# Patient Record
Sex: Female | Born: 1965 | Race: Black or African American | Hispanic: No | Marital: Married | Smoking: Current every day smoker
Health system: Southern US, Community
[De-identification: ages and names within clinical notes are randomized; demographics above are authoritative.]

## PROBLEM LIST (undated history)

## (undated) DIAGNOSIS — C539 Malignant neoplasm of cervix uteri, unspecified: Secondary | ICD-10-CM

## (undated) DIAGNOSIS — K219 Gastro-esophageal reflux disease without esophagitis: Secondary | ICD-10-CM

## (undated) DIAGNOSIS — E785 Hyperlipidemia, unspecified: Secondary | ICD-10-CM

## (undated) DIAGNOSIS — K7460 Unspecified cirrhosis of liver: Secondary | ICD-10-CM

## (undated) DIAGNOSIS — R51 Headache: Secondary | ICD-10-CM

## (undated) DIAGNOSIS — G35 Multiple sclerosis: Secondary | ICD-10-CM

## (undated) DIAGNOSIS — F419 Anxiety disorder, unspecified: Secondary | ICD-10-CM

## (undated) DIAGNOSIS — K769 Liver disease, unspecified: Secondary | ICD-10-CM

## (undated) DIAGNOSIS — Z87442 Personal history of urinary calculi: Secondary | ICD-10-CM

## (undated) DIAGNOSIS — F1011 Alcohol abuse, in remission: Secondary | ICD-10-CM

## (undated) DIAGNOSIS — G43909 Migraine, unspecified, not intractable, without status migrainosus: Secondary | ICD-10-CM

## (undated) DIAGNOSIS — Z8673 Personal history of transient ischemic attack (TIA), and cerebral infarction without residual deficits: Secondary | ICD-10-CM

## (undated) DIAGNOSIS — M359 Systemic involvement of connective tissue, unspecified: Secondary | ICD-10-CM

## (undated) DIAGNOSIS — G4700 Insomnia, unspecified: Secondary | ICD-10-CM

## (undated) DIAGNOSIS — J449 Chronic obstructive pulmonary disease, unspecified: Secondary | ICD-10-CM

## (undated) DIAGNOSIS — Z9289 Personal history of other medical treatment: Secondary | ICD-10-CM

## (undated) DIAGNOSIS — F32A Depression, unspecified: Secondary | ICD-10-CM

## (undated) DIAGNOSIS — F329 Major depressive disorder, single episode, unspecified: Secondary | ICD-10-CM

## (undated) DIAGNOSIS — Z9889 Other specified postprocedural states: Secondary | ICD-10-CM

## (undated) DIAGNOSIS — I10 Essential (primary) hypertension: Secondary | ICD-10-CM

## (undated) HISTORY — PX: CERVICAL CONE BIOPSY: SUR198

## (undated) HISTORY — PX: ECTOPIC PREGNANCY SURGERY: SHX613

## (undated) HISTORY — DX: Personal history of other medical treatment: Z92.89

## (undated) HISTORY — DX: Malignant neoplasm of cervix uteri, unspecified: C53.9

## (undated) HISTORY — DX: Depression, unspecified: F32.A

## (undated) HISTORY — DX: Personal history of transient ischemic attack (TIA), and cerebral infarction without residual deficits: Z86.73

## (undated) HISTORY — DX: Unspecified cirrhosis of liver: K74.60

## (undated) HISTORY — PX: HAND SURGERY: SHX662

## (undated) HISTORY — PX: TUBAL LIGATION: SHX77

## (undated) HISTORY — DX: Major depressive disorder, single episode, unspecified: F32.9

## (undated) HISTORY — DX: Chronic obstructive pulmonary disease, unspecified: J44.9

## (undated) HISTORY — DX: Liver disease, unspecified: K76.9

## (undated) HISTORY — DX: Alcohol abuse, in remission: F10.11

## (undated) HISTORY — DX: Multiple sclerosis: G35

## (undated) HISTORY — DX: Anxiety disorder, unspecified: F41.9

## (undated) HISTORY — DX: Other specified postprocedural states: Z98.890

## (undated) HISTORY — DX: Essential (primary) hypertension: I10

---

## 1998-09-13 DIAGNOSIS — Z87442 Personal history of urinary calculi: Secondary | ICD-10-CM

## 1998-09-13 HISTORY — DX: Personal history of urinary calculi: Z87.442

## 2000-09-13 HISTORY — PX: BREAST SURGERY: SHX581

## 2001-01-18 ENCOUNTER — Encounter (HOSPITAL_COMMUNITY): Admission: RE | Admit: 2001-01-18 | Discharge: 2001-02-17 | Payer: Self-pay | Admitting: Oncology

## 2001-01-18 ENCOUNTER — Encounter: Admission: RE | Admit: 2001-01-18 | Discharge: 2001-01-18 | Payer: Self-pay | Admitting: Oncology

## 2001-06-05 ENCOUNTER — Ambulatory Visit (HOSPITAL_BASED_OUTPATIENT_CLINIC_OR_DEPARTMENT_OTHER): Admission: RE | Admit: 2001-06-05 | Discharge: 2001-06-06 | Payer: Self-pay | Admitting: Specialist

## 2001-06-05 ENCOUNTER — Encounter (INDEPENDENT_AMBULATORY_CARE_PROVIDER_SITE_OTHER): Payer: Self-pay | Admitting: *Deleted

## 2001-07-19 ENCOUNTER — Emergency Department (HOSPITAL_COMMUNITY): Admission: EM | Admit: 2001-07-19 | Discharge: 2001-07-19 | Payer: Self-pay | Admitting: *Deleted

## 2002-09-13 HISTORY — PX: ABDOMINAL HYSTERECTOMY: SHX81

## 2003-11-14 ENCOUNTER — Emergency Department (HOSPITAL_COMMUNITY): Admission: EM | Admit: 2003-11-14 | Discharge: 2003-11-14 | Payer: Self-pay | Admitting: Emergency Medicine

## 2003-12-17 ENCOUNTER — Inpatient Hospital Stay (HOSPITAL_COMMUNITY): Admission: RE | Admit: 2003-12-17 | Discharge: 2003-12-20 | Payer: Self-pay | Admitting: Obstetrics & Gynecology

## 2004-04-03 ENCOUNTER — Ambulatory Visit (HOSPITAL_COMMUNITY): Admission: RE | Admit: 2004-04-03 | Discharge: 2004-04-03 | Payer: Self-pay | Admitting: Family Medicine

## 2004-12-31 ENCOUNTER — Emergency Department: Payer: Self-pay | Admitting: Internal Medicine

## 2006-09-21 ENCOUNTER — Ambulatory Visit (HOSPITAL_COMMUNITY): Admission: RE | Admit: 2006-09-21 | Discharge: 2006-09-21 | Payer: Self-pay | Admitting: Family Medicine

## 2007-01-01 ENCOUNTER — Emergency Department (HOSPITAL_COMMUNITY): Admission: EM | Admit: 2007-01-01 | Discharge: 2007-01-01 | Payer: Self-pay | Admitting: Emergency Medicine

## 2007-11-08 ENCOUNTER — Emergency Department (HOSPITAL_COMMUNITY): Admission: EM | Admit: 2007-11-08 | Discharge: 2007-11-08 | Payer: Self-pay | Admitting: Emergency Medicine

## 2007-11-17 ENCOUNTER — Ambulatory Visit (HOSPITAL_COMMUNITY): Admission: RE | Admit: 2007-11-17 | Discharge: 2007-11-17 | Payer: Self-pay | Admitting: Orthopaedic Surgery

## 2008-01-16 ENCOUNTER — Emergency Department (HOSPITAL_COMMUNITY): Admission: EM | Admit: 2008-01-16 | Discharge: 2008-01-16 | Payer: Self-pay | Admitting: Emergency Medicine

## 2008-03-13 ENCOUNTER — Ambulatory Visit (HOSPITAL_COMMUNITY): Admission: RE | Admit: 2008-03-13 | Discharge: 2008-03-13 | Payer: Self-pay | Admitting: Family Medicine

## 2008-09-13 ENCOUNTER — Emergency Department (HOSPITAL_COMMUNITY): Admission: EM | Admit: 2008-09-13 | Discharge: 2008-09-13 | Payer: Self-pay | Admitting: Emergency Medicine

## 2009-06-19 ENCOUNTER — Ambulatory Visit (HOSPITAL_COMMUNITY): Admission: RE | Admit: 2009-06-19 | Discharge: 2009-06-19 | Payer: Self-pay | Admitting: Family Medicine

## 2009-12-23 ENCOUNTER — Observation Stay (HOSPITAL_COMMUNITY): Admission: EM | Admit: 2009-12-23 | Discharge: 2009-12-26 | Payer: Self-pay | Admitting: Emergency Medicine

## 2009-12-25 ENCOUNTER — Ambulatory Visit: Payer: Self-pay | Admitting: Cardiology

## 2009-12-31 ENCOUNTER — Ambulatory Visit (HOSPITAL_COMMUNITY): Admission: RE | Admit: 2009-12-31 | Discharge: 2009-12-31 | Payer: Self-pay | Admitting: Cardiology

## 2009-12-31 ENCOUNTER — Ambulatory Visit: Payer: Self-pay | Admitting: Cardiology

## 2009-12-31 ENCOUNTER — Encounter: Payer: Self-pay | Admitting: Cardiology

## 2010-04-21 ENCOUNTER — Observation Stay (HOSPITAL_COMMUNITY): Admission: RE | Admit: 2010-04-21 | Discharge: 2010-04-22 | Payer: Self-pay | Admitting: Cardiology

## 2010-11-27 LAB — CBC
HCT: 36.8 % (ref 36.0–46.0)
MCH: 30.1 pg (ref 26.0–34.0)
MCHC: 32.3 g/dL (ref 30.0–36.0)
MCV: 93.2 fL (ref 78.0–100.0)
Platelets: 368 10*3/uL (ref 150–400)
RDW: 13.5 % (ref 11.5–15.5)

## 2010-11-27 LAB — BASIC METABOLIC PANEL
BUN: 10 mg/dL (ref 6–23)
Creatinine, Ser: 0.69 mg/dL (ref 0.4–1.2)
GFR calc Af Amer: >60 mL/min (ref >60)

## 2010-12-02 LAB — CBC
HCT: 38 % (ref 36.0–46.0)
Hemoglobin: 13.2 g/dL (ref 12.0–15.0)
MCHC: 34.8 g/dL (ref 30.0–36.0)
MCV: 92.1 fL (ref 78.0–100.0)
RDW: 13.3 % (ref 11.5–15.5)

## 2010-12-02 LAB — LIPID PANEL
Cholesterol: 175 mg/dL (ref 0–200)
HDL: 64 mg/dL (ref >39)
LDL Cholesterol: 76 mg/dL (ref 0–99)
Total CHOL/HDL Ratio: 2.7 RATIO
Triglycerides: 177 mg/dL — ABNORMAL HIGH (ref <150)

## 2010-12-02 LAB — APTT: aPTT: 26 seconds (ref 24–37)

## 2010-12-02 LAB — BASIC METABOLIC PANEL
BUN: 7 mg/dL (ref 6–23)
BUN: 7 mg/dL (ref 6–23)
CO2: 24 mEq/L (ref 19–32)
Chloride: 103 mEq/L (ref 96–112)
Chloride: 110 mEq/L (ref 96–112)
GFR calc Af Amer: >60 mL/min (ref >60)
GFR calc non Af Amer: >60 mL/min (ref >60)
Potassium: 4.1 mEq/L (ref 3.5–5.1)
Sodium: 137 mEq/L (ref 135–145)

## 2010-12-02 LAB — DIFFERENTIAL
Basophils Absolute: 0.1 10*3/uL (ref 0.0–0.1)
Basophils Relative: 1 % (ref 0–1)
Eosinophils Absolute: 0.1 10*3/uL (ref 0.0–0.7)
Eosinophils Relative: 1 % (ref 0–5)
Lymphocytes Relative: 33 % (ref 12–46)
Monocytes Relative: 5 % (ref 3–12)

## 2010-12-02 LAB — CK TOTAL AND CKMB (NOT AT ARMC)
Relative Index: 1.6 (ref 0.0–2.5)
Total CK: 119 U/L (ref 7–177)

## 2010-12-02 LAB — MAGNESIUM: Magnesium: 2 mg/dL (ref 1.5–2.5)

## 2010-12-02 LAB — POCT CARDIAC MARKERS: Troponin i, poc: <0.05 ng/mL (ref 0.00–0.09)

## 2010-12-02 LAB — CARDIAC PANEL(CRET KIN+CKTOT+MB+TROPI)
CK, MB: 1.7 ng/mL (ref 0.3–4.0)
Total CK: 102 U/L (ref 7–177)
Total CK: 82 U/L (ref 7–177)
Troponin I: 0.01 ng/mL (ref 0.00–0.06)

## 2010-12-02 LAB — PROTIME-INR
INR: 0.96 (ref 0.00–1.49)
Prothrombin Time: 12.7 seconds (ref 11.6–15.2)

## 2010-12-28 LAB — GC/CHLAMYDIA PROBE AMP, GENITAL
Chlamydia, DNA Probe: NEGATIVE
GC Probe Amp, Genital: NEGATIVE

## 2010-12-28 LAB — COMPREHENSIVE METABOLIC PANEL
ALT: 36 U/L — ABNORMAL HIGH (ref 0–35)
Albumin: 4.3 g/dL (ref 3.5–5.2)
Alkaline Phosphatase: 52 U/L (ref 39–117)
Chloride: 100 mEq/L (ref 96–112)
Glucose, Bld: 114 mg/dL — ABNORMAL HIGH (ref 70–99)
Potassium: 3.6 mEq/L (ref 3.5–5.1)
Sodium: 136 mEq/L (ref 135–145)
Total Bilirubin: 0.4 mg/dL (ref 0.3–1.2)
Total Protein: 7.8 g/dL (ref 6.0–8.3)

## 2010-12-28 LAB — DIFFERENTIAL
Basophils Absolute: 0.1 10*3/uL (ref 0.0–0.1)
Basophils Relative: 0 % (ref 0–1)
Eosinophils Absolute: 0.1 10*3/uL (ref 0.0–0.7)
Monocytes Absolute: 0.7 10*3/uL (ref 0.1–1.0)
Monocytes Relative: 5 % (ref 3–12)

## 2010-12-28 LAB — URINALYSIS, ROUTINE W REFLEX MICROSCOPIC
Glucose, UA: NEGATIVE mg/dL
Ketones, ur: NEGATIVE mg/dL
Leukocytes, UA: NEGATIVE
Protein, ur: NEGATIVE mg/dL
pH: 5.5 (ref 5.0–8.0)

## 2010-12-28 LAB — CBC
Hemoglobin: 14.7 g/dL (ref 12.0–15.0)
Platelets: 457 10*3/uL — ABNORMAL HIGH (ref 150–400)
RDW: 14.4 % (ref 11.5–15.5)
WBC: 13.6 10*3/uL — ABNORMAL HIGH (ref 4.0–10.5)

## 2011-01-29 NOTE — Discharge Summary (Signed)
Courtney Thornton, Courtney Thornton                     ACCOUNT NO.:  [id number]   MEDICAL RECORD NO.:  [id number]                   PATIENT TYPE:  INP   LOCATION:  A402                                 FACILITY:  [facility]   PHYSICIAN:  Lazaro Arms, M.D.                DATE OF BIRTH:  [birth year]   DATE OF ADMISSION:  12/17/2003  DATE OF DISCHARGE:  12/20/2003                                 DISCHARGE SUMMARY   DISCHARGE DIAGNOSES:  1. Status post abdominal hysterectomy.  2. Unremarkable postoperative course.   PROCEDURE:  Abdominal hysterectomy.   HISTORY OF PRESENT ILLNESS:  Please refer to the transcribed history and  physical and the operative note for details of admission to the hospital.   HOSPITAL COURSE:  The patient was admitted after abdominal hysterectomy.  Intraoperative course was unremarkable.  She tolerated clear liquids and a  regular diet.  She voided without symptoms.  She was ambulatory and had  return of normal bowel function.  Her postoperative day #1 hemoglobin was  11, hematocrit 32.4, white count 13.7.  She remained afebrile.  Her incision  was clean, dry, and intact.  She tolerated progression from IV medicine to  oral pain medicine.  She was discharged to home on the morning of  postoperative day #3 in good and stable condition.   FOLLOW UP:  She is to follow up in the office next week to have her staples  removed and Steri-Strips placed.   DISCHARGE MEDICATIONS:  She was given Tylox and Motrin for pain, Phenergan  for nausea, and Levaquin as an antibiotic.   DISCHARGE INSTRUCTIONS:  If she has any problems in between now and her  scheduled appointment, she will give the office a call.     ___________________________________________                                         Lazaro Arms, M.D.   LHE/MEDQ  D:  01/21/2004  T:  01/21/2004  Job:  [id number]

## 2011-01-29 NOTE — Op Note (Signed)
[name], Courtney Thornton                     ACCOUNT NO.:  [id number]   MEDICAL RECORD NO.:  [id number]                   PATIENT TYPE:  AMB   LOCATION:  DAY                                  FACILITY:  [facility]   PHYSICIAN:  Lazaro Arms, M.D.                DATE OF BIRTH:  September 27, 1965   DATE OF PROCEDURE:  12/17/2003  DATE OF DISCHARGE:                                 OPERATIVE REPORT   PREOPERATIVE DIAGNOSES:  1. Large fibroid uterus.  2. Menometrorrhagia.  3. Dysmenorrhea.  4. Dyspareunia.   POSTOPERATIVE DIAGNOSES:  1. Large fibroid uterus.  2. Menometrorrhagia.  3. Dysmenorrhea.  4. Dyspareunia.   PROCEDURE:  Abdominal hysterectomy.   SURGEON:  Lazaro Arms, M.D.   ANESTHESIA:  General endotracheal.   BLOOD LOSS:  100 cc.   SPECIMENS:  All went to the lab.   FINDINGS:  The patient had an irregularly shaped fibroid uterus. It was  enlarged.  The ovaries were normal.  There were no intraperitoneal  abnormalities.   DESCRIPTION OF OPERATION:  The patient was taken to the operating room and  placed in the supine position where she underwent general endotracheal  anesthesia.  The vagina was prepped; Foley catheter was placed.  The abdomen  was prepped and draped in the usual sterile fashion.  A Pfannenstiel skin  incision was made and carried down sharply through the rectus fascia which  was scored in the midline and extended laterally.  The fascia was taken off  the muscles superiorly and inferiorly.  The peritoneal cavity was entered in  the usual fashion.  A small protractor was placed as a self-retaining  protractor.  The uterus was grasped, delivered from the pelvis, the upper  abdomen was packed away.  The right round ligament was suture ligated and  cut.  The utero-ovarian ligaments were clamped, cut and suture ligated  doubly.  This was done on the right as well.  The uterine vessels were  skeletonized.   The vesicouterine serosal flap was created and the  bladder was pushed off  the lower uterine segment.  The uterine vessels were clamped, cut, and  suture ligated.  Serial pedicles were taken down the cervix.  Each pedicle  was clamped, cut, and sutured ligated.  The vaginal angles were clamped, cut  and transfixion suture ligated.  The vagina was closed with interrupted  figure-of-eight sutures in the usual fashion. The specimen was removed and  sent to the pathology lab.   The pelvis was hemostatic.  It was irrigated vigorously.  INTERCEED was  placed on the vaginal cuff to prevent postoperative adhesion formation.  The  packs were removed.  The protractor was removed.  The muscles were  reapproximated loosely.  The fascia was closed using #0 Vicryl running.  The  subcutaneous tissue was made hemostatic and irrigated.  A subcu pain pump  was placed as medically indicated for postoperative pain management.  The  skin was closed using skin staples.  The patient tolerated the procedure  well.  She experienced 100 cc of blood loss.  She was taken to the recovery  room in good stable condition.  All counts were correct.  She received Ancef  prophylactically.      ___________________________________________                                            Lazaro Arms, M.D.   Loraine Maple  D:  12/17/2003  T:  12/17/2003  Job:  [id number]

## 2011-01-29 NOTE — Op Note (Signed)
[place]  Patient:    Courtney Thornton, Courtney Thornton Visit Number: [id number] MRN: [id number]          Service Type: Attending:  Yaakov Guthrie. Shon Hough, M.D. Dictated by:   Yaakov Guthrie. Shon Hough, M.D. Proc. Date: 06/05/01 Adm. Date:  06/05/01   CC:         Earvin Hansen L. Shon Hough, M.D.   Operative Report  HISTORY:  This is a 45 year old lady with severe macromastia, back and shoulder pain secondary to large pendulous breasts with increased accessory breast tissue.  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  PROCEDURES PLANNED:  Bilateral breast reductions using the inferior pedicle technique, reduction of accessory breast tissue.  SURGEON:  Yaakov Guthrie. Shon Hough, M.D.  ASSISTANT:  Margaretha Sheffield.  FIRST ASSISTANT:  RN.  ANESTHESIA:  General.  PREOPERATIVELY:  The patient was set up for the inferior pedicle reduction and mammoplasty remarking.  The nipple areolar complexes to 20 cm from the suprasternal notch from over 34.  She then underwent general anesthesia and was intubated orally.  Prep was done to the chest and breast areas in the routine fashion using Betadine soap and solution and walled off with sterile towels and drapes so as to make a sterile field.  Locally, 1/4% Xylocaine was injected, 100 cc per side for vasoconstriction.  The wounds were scored with #15 blade and then the skin over the inferior pedicle was epithelialized with a #20 blade.  The medial and lateral fatty, dermal pedicles were sliced down to the underlying fascia.  After proper hemostasis the new key-hole area was also debulked out laterally and more tissue was taken.  Accessory breast tissue using Bovie unit coagulation.  After hemostasis, the flaps were transposed and stayed with 3-0 Prolene.  Subcutaneous closures were done with 3-0 Monocryl x 2 layers and then a running subcuticular stitch of 3-0 Monocryl and 5-0 Monocryl throughout inverted T.  The wounds were  drained with #10 Blake drains which were placed in the depths of the wound and brought out through the lateral most portion of the incision and secured with 5-0 Prolene.  The wounds were cleansed.  Steri-Strips and soft dressing were applied to all the areas.  She withstood the procedures very well and was taken to the recovery room in excellent condition. Dictated by:   Yaakov Guthrie. Shon Hough, M.D. Attending:  Yaakov Guthrie. Shon Hough, M.D. DD:  06/05/01 TD:  06/05/01 Job: [id number]

## 2011-08-04 ENCOUNTER — Other Ambulatory Visit: Payer: Self-pay | Admitting: Medical

## 2011-10-11 ENCOUNTER — Other Ambulatory Visit: Payer: Self-pay | Admitting: Medical

## 2011-10-20 ENCOUNTER — Other Ambulatory Visit (HOSPITAL_COMMUNITY): Payer: Self-pay | Admitting: Neurosurgery

## 2011-10-20 DIAGNOSIS — M5412 Radiculopathy, cervical region: Secondary | ICD-10-CM

## 2011-10-20 DIAGNOSIS — R292 Abnormal reflex: Secondary | ICD-10-CM

## 2011-10-20 DIAGNOSIS — M545 Low back pain: Secondary | ICD-10-CM

## 2011-10-20 DIAGNOSIS — M542 Cervicalgia: Secondary | ICD-10-CM

## 2011-10-28 ENCOUNTER — Ambulatory Visit (HOSPITAL_COMMUNITY)
Admission: RE | Admit: 2011-10-28 | Discharge: 2011-10-28 | Disposition: A | Discharge status: Home / Self Care | Payer: BC Managed Care – PPO | Source: Ambulatory Visit | Attending: Neurosurgery | Admitting: Neurosurgery

## 2011-10-28 ENCOUNTER — Ambulatory Visit (HOSPITAL_COMMUNITY): Payer: Self-pay

## 2011-10-28 DIAGNOSIS — M5126 Other intervertebral disc displacement, lumbar region: Secondary | ICD-10-CM | POA: Insufficient documentation

## 2011-10-28 DIAGNOSIS — M5412 Radiculopathy, cervical region: Secondary | ICD-10-CM

## 2011-10-28 DIAGNOSIS — G9589 Other specified diseases of spinal cord: Secondary | ICD-10-CM | POA: Insufficient documentation

## 2011-10-28 DIAGNOSIS — M47812 Spondylosis without myelopathy or radiculopathy, cervical region: Secondary | ICD-10-CM | POA: Insufficient documentation

## 2011-10-28 DIAGNOSIS — M5137 Other intervertebral disc degeneration, lumbosacral region: Secondary | ICD-10-CM | POA: Insufficient documentation

## 2011-10-28 DIAGNOSIS — M545 Low back pain: Secondary | ICD-10-CM

## 2011-10-28 DIAGNOSIS — M51379 Other intervertebral disc degeneration, lumbosacral region without mention of lumbar back pain or lower extremity pain: Secondary | ICD-10-CM | POA: Insufficient documentation

## 2011-10-28 DIAGNOSIS — R292 Abnormal reflex: Secondary | ICD-10-CM

## 2011-10-28 DIAGNOSIS — M542 Cervicalgia: Secondary | ICD-10-CM

## 2011-11-17 ENCOUNTER — Other Ambulatory Visit: Payer: Self-pay | Admitting: Neurosurgery

## 2011-11-19 ENCOUNTER — Encounter (HOSPITAL_COMMUNITY): Payer: Self-pay | Admitting: Pharmacy Technician

## 2011-11-22 ENCOUNTER — Other Ambulatory Visit: Payer: Self-pay

## 2011-11-22 ENCOUNTER — Encounter (HOSPITAL_COMMUNITY)
Admission: RE | Admit: 2011-11-22 | Discharge: 2011-11-22 | Disposition: A | Discharge status: Home / Self Care | Payer: BC Managed Care – PPO | Source: Ambulatory Visit | Attending: Anesthesiology | Admitting: Anesthesiology

## 2011-11-22 ENCOUNTER — Encounter (HOSPITAL_COMMUNITY)
Admission: RE | Admit: 2011-11-22 | Discharge: 2011-11-22 | Disposition: A | Discharge status: Home / Self Care | Payer: BC Managed Care – PPO | Source: Ambulatory Visit | Attending: Neurosurgery | Admitting: Neurosurgery

## 2011-11-22 ENCOUNTER — Encounter (HOSPITAL_COMMUNITY): Payer: Self-pay

## 2011-11-22 HISTORY — DX: Hyperlipidemia, unspecified: E78.5

## 2011-11-22 HISTORY — DX: Insomnia, unspecified: G47.00

## 2011-11-22 HISTORY — DX: Personal history of urinary calculi: Z87.442

## 2011-11-22 HISTORY — DX: Migraine, unspecified, not intractable, without status migrainosus: G43.909

## 2011-11-22 HISTORY — DX: Gastro-esophageal reflux disease without esophagitis: K21.9

## 2011-11-22 HISTORY — DX: Headache: R51

## 2011-11-22 LAB — SURGICAL PCR SCREEN
MRSA, PCR: NEGATIVE
Staphylococcus aureus: NEGATIVE

## 2011-11-22 LAB — CBC
Hemoglobin: 13.8 g/dL (ref 12.0–15.0)
MCHC: 34.3 g/dL (ref 30.0–36.0)
Platelets: 279 10*3/uL (ref 150–400)

## 2011-11-22 LAB — BASIC METABOLIC PANEL
BUN: 7 mg/dL (ref 6–23)
GFR calc Af Amer: >90 mL/min (ref >90)
GFR calc non Af Amer: >90 mL/min (ref >90)
Potassium: 3.3 mEq/L — ABNORMAL LOW (ref 3.5–5.1)
Sodium: 138 mEq/L (ref 135–145)

## 2011-11-22 NOTE — Pre-Procedure Instructions (Signed)
20 Courtney Thornton  11/22/2011   Your procedure is scheduled on:  Wed, Mar 13 @ 1125 am  Report to [facility] at 0830 AM.  Call this number if you have problems the morning of surgery: [phone]   Remember:   Do not eat food:After Midnight.  May have clear liquids: up to 4 Hours before arrival.(until 4:30 am)  Clear liquids include soda, tea, black coffee, apple or grape juice, broth.  Take these medicines the morning of surgery with A SIP OF WATER: Zoloft,Zantac,Omeprazole,Metoprolol,Xanax,and Albuterol(if needed and Bring Your Inhaler With You)   Do not wear jewelry, make-up or nail polish.  Do not wear lotions, powders, or perfumes. You may wear deodorant.  Do not shave 48 hours prior to surgery.  Do not bring valuables to the hospital.  Contacts, dentures or bridgework may not be worn into surgery.  Leave suitcase in the car. After surgery it may be brought to your room.  For patients admitted to the hospital, checkout time is 11:00 AM the day of discharge.   Patients discharged the day of surgery will not be allowed to drive home.    Special Instructions: CHG Shower Use Special Wash: 1/2 bottle night before surgery and 1/2 bottle morning of surgery.   Please read over the following fact sheets that you were given: Pain Booklet, Coughing and Deep Breathing, MRSA Information and Surgical Site Infection Prevention

## 2011-11-22 NOTE — Progress Notes (Signed)
D/t pt having hx of high platelets and having to receive blood transfusions in the past-called Dr.Massagee to see if he would like a type and screen done,Not at present and if labs come back then we can do on DOS

## 2011-11-22 NOTE — Progress Notes (Signed)
Hasn't had B/P meds since Fri d/t too expensive but does have some at home and will start back taking today

## 2011-11-22 NOTE — Progress Notes (Signed)
Heart cath in 2011-report in epic  Doesn't see a cardiologist  Echo and Stress test in epic from 12/31/09  Medical MD manages HTN-[facility] to [facility] in [place]

## 2011-11-23 MED ORDER — CEFAZOLIN SODIUM 1-5 GM-% IV SOLN
1.0000 g | INTRAVENOUS | Status: AC
  Administered 2011-11-24: 1 g via INTRAVENOUS
  Filled 2011-11-23: qty 50

## 2011-11-24 ENCOUNTER — Ambulatory Visit (HOSPITAL_COMMUNITY): Payer: BC Managed Care – PPO | Admitting: Anesthesiology

## 2011-11-24 ENCOUNTER — Encounter (HOSPITAL_COMMUNITY): Payer: Self-pay | Admitting: Anesthesiology

## 2011-11-24 ENCOUNTER — Encounter (HOSPITAL_COMMUNITY): Payer: Self-pay | Admitting: *Deleted

## 2011-11-24 ENCOUNTER — Ambulatory Visit (HOSPITAL_COMMUNITY)
Admission: RE | Admit: 2011-11-24 | Discharge: 2011-11-26 | Disposition: A | Discharge status: Home / Self Care | Payer: BC Managed Care – PPO | Source: Ambulatory Visit | Attending: Neurosurgery | Admitting: Neurosurgery

## 2011-11-24 ENCOUNTER — Ambulatory Visit (HOSPITAL_COMMUNITY): Payer: BC Managed Care – PPO

## 2011-11-24 ENCOUNTER — Encounter (HOSPITAL_COMMUNITY)
Admission: RE | Disposition: A | Discharge status: Home / Self Care | Payer: Self-pay | Source: Ambulatory Visit | Attending: Neurosurgery

## 2011-11-24 DIAGNOSIS — E785 Hyperlipidemia, unspecified: Secondary | ICD-10-CM | POA: Insufficient documentation

## 2011-11-24 DIAGNOSIS — K219 Gastro-esophageal reflux disease without esophagitis: Secondary | ICD-10-CM | POA: Insufficient documentation

## 2011-11-24 DIAGNOSIS — Z01812 Encounter for preprocedural laboratory examination: Secondary | ICD-10-CM | POA: Insufficient documentation

## 2011-11-24 DIAGNOSIS — J438 Other emphysema: Secondary | ICD-10-CM | POA: Insufficient documentation

## 2011-11-24 DIAGNOSIS — I10 Essential (primary) hypertension: Secondary | ICD-10-CM | POA: Insufficient documentation

## 2011-11-24 DIAGNOSIS — Z01810 Encounter for preprocedural cardiovascular examination: Secondary | ICD-10-CM | POA: Insufficient documentation

## 2011-11-24 DIAGNOSIS — M4712 Other spondylosis with myelopathy, cervical region: Secondary | ICD-10-CM | POA: Insufficient documentation

## 2011-11-24 DIAGNOSIS — Z8673 Personal history of transient ischemic attack (TIA), and cerebral infarction without residual deficits: Secondary | ICD-10-CM | POA: Insufficient documentation

## 2011-11-24 DIAGNOSIS — M5000 Cervical disc disorder with myelopathy, unspecified cervical region: Secondary | ICD-10-CM | POA: Insufficient documentation

## 2011-11-24 DIAGNOSIS — Z01818 Encounter for other preprocedural examination: Secondary | ICD-10-CM | POA: Insufficient documentation

## 2011-11-24 HISTORY — PX: ANTERIOR CERVICAL DECOMP/DISCECTOMY FUSION: SHX1161

## 2011-11-24 SURGERY — ANTERIOR CERVICAL DECOMPRESSION/DISCECTOMY FUSION 3 LEVELS
Anesthesia: General | Site: Neck | Wound class: Clean

## 2011-11-24 MED ORDER — HYDROCODONE-ACETAMINOPHEN 5-325 MG PO TABS: 1.0000 | ORAL_TABLET | ORAL | Status: DC | PRN

## 2011-11-24 MED ORDER — SERTRALINE HCL 50 MG PO TABS
50.0000 mg | ORAL_TABLET | Freq: Every day | ORAL | Status: DC
  Administered 2011-11-24 – 2011-11-25 (×2): 50 mg via ORAL
  Filled 2011-11-24 (×3): qty 1

## 2011-11-24 MED ORDER — FAMOTIDINE 20 MG PO TABS
20.0000 mg | ORAL_TABLET | Freq: Every day | ORAL | Status: DC
  Administered 2011-11-24 – 2011-11-25 (×2): 20 mg via ORAL
  Filled 2011-11-24 (×3): qty 1

## 2011-11-24 MED ORDER — SODIUM CHLORIDE 0.9 % IJ SOLN
3.0000 mL | Freq: Two times a day (BID) | INTRAMUSCULAR | Status: DC
  Administered 2011-11-25 (×2): 3 mL via INTRAVENOUS

## 2011-11-24 MED ORDER — HYDROXYZINE HCL 50 MG/ML IM SOLN
50.0000 mg | INTRAMUSCULAR | Status: DC | PRN
  Administered 2011-11-24: 50 mg via INTRAMUSCULAR
  Filled 2011-11-24: qty 1

## 2011-11-24 MED ORDER — SODIUM CHLORIDE 0.9 % IV SOLN
INTRAVENOUS | Status: AC
  Filled 2011-11-24: qty 500

## 2011-11-24 MED ORDER — HYDROMORPHONE HCL PF 1 MG/ML IJ SOLN
0.2500 mg | INTRAMUSCULAR | Status: DC | PRN
  Administered 2011-11-24 (×2): 0.25 mg via INTRAVENOUS

## 2011-11-24 MED ORDER — LACTATED RINGERS IV SOLN
INTRAVENOUS | Status: DC | PRN
  Administered 2011-11-24 (×3): via INTRAVENOUS

## 2011-11-24 MED ORDER — KETOROLAC TROMETHAMINE 30 MG/ML IJ SOLN
INTRAMUSCULAR | Status: AC
  Administered 2011-11-24: 30 mg via INTRAVENOUS
  Filled 2011-11-24: qty 1

## 2011-11-24 MED ORDER — PANTOPRAZOLE SODIUM 40 MG PO TBEC
40.0000 mg | DELAYED_RELEASE_TABLET | Freq: Every day | ORAL | Status: DC
  Administered 2011-11-24 – 2011-11-25 (×2): 40 mg via ORAL
  Filled 2011-11-24 (×2): qty 1

## 2011-11-24 MED ORDER — SODIUM CHLORIDE 0.9 % IJ SOLN: 3.0000 mL | INTRAMUSCULAR | Status: DC | PRN

## 2011-11-24 MED ORDER — GLYCOPYRROLATE 0.2 MG/ML IJ SOLN
INTRAMUSCULAR | Status: DC | PRN
  Administered 2011-11-24: .6 mg via INTRAVENOUS

## 2011-11-24 MED ORDER — FENTANYL CITRATE 0.05 MG/ML IJ SOLN
INTRAMUSCULAR | Status: DC | PRN
  Administered 2011-11-24 (×3): 50 ug via INTRAVENOUS
  Administered 2011-11-24: 200 ug via INTRAVENOUS
  Administered 2011-11-24: 50 ug via INTRAVENOUS

## 2011-11-24 MED ORDER — ACETAMINOPHEN 325 MG PO TABS: 650.0000 mg | ORAL_TABLET | ORAL | Status: DC | PRN

## 2011-11-24 MED ORDER — 0.9 % SODIUM CHLORIDE (POUR BTL) OPTIME
TOPICAL | Status: DC | PRN
  Administered 2011-11-24: 1000 mL

## 2011-11-24 MED ORDER — HYDROCHLOROTHIAZIDE 12.5 MG PO CAPS
12.5000 mg | ORAL_CAPSULE | Freq: Every day | ORAL | Status: DC
  Administered 2011-11-25: 12.5 mg via ORAL
  Filled 2011-11-24 (×3): qty 1

## 2011-11-24 MED ORDER — BACITRACIN 50000 UNITS IM SOLR
INTRAMUSCULAR | Status: AC
  Filled 2011-11-24: qty 1

## 2011-11-24 MED ORDER — KETOROLAC TROMETHAMINE 30 MG/ML IJ SOLN
30.0000 mg | Freq: Four times a day (QID) | INTRAMUSCULAR | Status: DC
  Administered 2011-11-24 – 2011-11-26 (×6): 30 mg via INTRAVENOUS
  Filled 2011-11-24 (×11): qty 1

## 2011-11-24 MED ORDER — HYDROXYZINE HCL 25 MG PO TABS: 50.0000 mg | ORAL_TABLET | ORAL | Status: DC | PRN

## 2011-11-24 MED ORDER — NITROGLYCERIN 0.4 MG SL SUBL: 0.4000 mg | SUBLINGUAL_TABLET | SUBLINGUAL | Status: DC | PRN

## 2011-11-24 MED ORDER — MORPHINE SULFATE 4 MG/ML IJ SOLN: 4.0000 mg | INTRAMUSCULAR | Status: DC | PRN

## 2011-11-24 MED ORDER — ALPRAZOLAM 0.5 MG PO TABS: 0.5000 mg | ORAL_TABLET | Freq: Four times a day (QID) | ORAL | Status: DC | PRN

## 2011-11-24 MED ORDER — ROCURONIUM BROMIDE 100 MG/10ML IV SOLN
INTRAVENOUS | Status: DC | PRN
  Administered 2011-11-24: 10 mg via INTRAVENOUS
  Administered 2011-11-24: 40 mg via INTRAVENOUS
  Administered 2011-11-24 (×2): 10 mg via INTRAVENOUS

## 2011-11-24 MED ORDER — OXYCODONE-ACETAMINOPHEN 5-325 MG PO TABS
1.0000 | ORAL_TABLET | ORAL | Status: DC | PRN
  Administered 2011-11-24 – 2011-11-25 (×3): 1 via ORAL
  Administered 2011-11-25 (×2): 2 via ORAL
  Filled 2011-11-24: qty 1
  Filled 2011-11-24 (×2): qty 2
  Filled 2011-11-24 (×2): qty 1

## 2011-11-24 MED ORDER — ONDANSETRON HCL 4 MG/2ML IJ SOLN: 4.0000 mg | Freq: Once | INTRAMUSCULAR | Status: DC | PRN

## 2011-11-24 MED ORDER — THROMBIN 20000 UNITS EX KIT
PACK | OROMUCOSAL | Status: DC | PRN
  Administered 2011-11-24: 14:00:00 via TOPICAL

## 2011-11-24 MED ORDER — ALBUTEROL SULFATE HFA 108 (90 BASE) MCG/ACT IN AERS
2.0000 | INHALATION_SPRAY | Freq: Four times a day (QID) | RESPIRATORY_TRACT | Status: DC | PRN
  Filled 2011-11-24: qty 6.7

## 2011-11-24 MED ORDER — MAGNESIUM HYDROXIDE 400 MG/5ML PO SUSP: 30.0000 mL | Freq: Every day | ORAL | Status: DC | PRN

## 2011-11-24 MED ORDER — LIDOCAINE-EPINEPHRINE 1 %-1:100000 IJ SOLN
INTRAMUSCULAR | Status: DC | PRN
  Administered 2011-11-24: 20 mL

## 2011-11-24 MED ORDER — METOPROLOL TARTRATE 25 MG PO TABS
25.0000 mg | ORAL_TABLET | Freq: Two times a day (BID) | ORAL | Status: DC
  Administered 2011-11-24 – 2011-11-25 (×4): 25 mg via ORAL
  Filled 2011-11-24 (×6): qty 1

## 2011-11-24 MED ORDER — LISINOPRIL 20 MG PO TABS
20.0000 mg | ORAL_TABLET | Freq: Every day | ORAL | Status: DC
  Administered 2011-11-24 – 2011-11-25 (×2): 20 mg via ORAL
  Filled 2011-11-24 (×3): qty 1

## 2011-11-24 MED ORDER — MIDAZOLAM HCL 5 MG/5ML IJ SOLN
INTRAMUSCULAR | Status: DC | PRN
  Administered 2011-11-24: 2 mg via INTRAVENOUS

## 2011-11-24 MED ORDER — KCL IN DEXTROSE-NACL 40-5-0.45 MEQ/L-%-% IV SOLN
INTRAVENOUS | Status: DC
  Administered 2011-11-24: 22:00:00 via INTRAVENOUS
  Filled 2011-11-24 (×7): qty 1000

## 2011-11-24 MED ORDER — ALUM & MAG HYDROXIDE-SIMETH 200-200-20 MG/5ML PO SUSP: 30.0000 mL | Freq: Four times a day (QID) | ORAL | Status: DC | PRN

## 2011-11-24 MED ORDER — THROMBIN 5000 UNITS EX KIT
PACK | CUTANEOUS | Status: DC | PRN
  Administered 2011-11-24: 5000 [IU] via TOPICAL

## 2011-11-24 MED ORDER — PHENYLEPHRINE HCL 10 MG/ML IJ SOLN
INTRAMUSCULAR | Status: DC | PRN
  Administered 2011-11-24 (×3): 40 ug via INTRAVENOUS
  Administered 2011-11-24: 80 ug via INTRAVENOUS

## 2011-11-24 MED ORDER — CYCLOBENZAPRINE HCL 10 MG PO TABS
10.0000 mg | ORAL_TABLET | Freq: Three times a day (TID) | ORAL | Status: DC | PRN
  Administered 2011-11-25 (×2): 10 mg via ORAL
  Filled 2011-11-24 (×2): qty 1

## 2011-11-24 MED ORDER — KETOROLAC TROMETHAMINE 30 MG/ML IJ SOLN: 30.0000 mg | Freq: Four times a day (QID) | INTRAMUSCULAR | Status: DC

## 2011-11-24 MED ORDER — ACETAMINOPHEN 650 MG RE SUPP: 650.0000 mg | RECTAL | Status: DC | PRN

## 2011-11-24 MED ORDER — ONDANSETRON HCL 4 MG/2ML IJ SOLN
INTRAMUSCULAR | Status: DC | PRN
  Administered 2011-11-24: 4 mg via INTRAVENOUS

## 2011-11-24 MED ORDER — MENTHOL 3 MG MT LOZG: 1.0000 | LOZENGE | OROMUCOSAL | Status: DC | PRN

## 2011-11-24 MED ORDER — NEOSTIGMINE METHYLSULFATE 1 MG/ML IJ SOLN
INTRAMUSCULAR | Status: DC | PRN
  Administered 2011-11-24: 4 mg via INTRAVENOUS

## 2011-11-24 MED ORDER — LISINOPRIL-HYDROCHLOROTHIAZIDE 20-12.5 MG PO TABS: 1.0000 | ORAL_TABLET | Freq: Every day | ORAL | Status: DC

## 2011-11-24 MED ORDER — BUPIVACAINE HCL (PF) 0.25 % IJ SOLN
INTRAMUSCULAR | Status: DC | PRN
  Administered 2011-11-24: 30 mL

## 2011-11-24 MED ORDER — PROPOFOL 10 MG/ML IV EMUL
INTRAVENOUS | Status: DC | PRN
  Administered 2011-11-24: 110 mg via INTRAVENOUS

## 2011-11-24 MED ORDER — BISACODYL 10 MG RE SUPP: 10.0000 mg | Freq: Every day | RECTAL | Status: DC | PRN

## 2011-11-24 MED ORDER — LIDOCAINE HCL (CARDIAC) 20 MG/ML IV SOLN
INTRAVENOUS | Status: DC | PRN
  Administered 2011-11-24: 50 mg via INTRAVENOUS

## 2011-11-24 MED ORDER — PHENOL 1.4 % MT LIQD: 1.0000 | OROMUCOSAL | Status: DC | PRN

## 2011-11-24 MED ORDER — KETOROLAC TROMETHAMINE 30 MG/ML IJ SOLN
30.0000 mg | Freq: Once | INTRAMUSCULAR | Status: AC
  Administered 2011-11-24: 30 mg via INTRAVENOUS

## 2011-11-24 MED ORDER — HYDROMORPHONE HCL PF 1 MG/ML IJ SOLN
INTRAMUSCULAR | Status: AC
  Filled 2011-11-24: qty 1

## 2011-11-24 MED ORDER — BACITRACIN 50000 UNITS IM SOLR
INTRAMUSCULAR | Status: DC | PRN
  Administered 2011-11-24: 14:00:00

## 2011-11-24 MED ORDER — SODIUM CHLORIDE 0.9 % IV SOLN: 250.0000 mL | INTRAVENOUS | Status: DC

## 2011-11-24 MED ORDER — INFLUENZA VIRUS VACC SPLIT PF IM SUSP
0.5000 mL | INTRAMUSCULAR | Status: AC
  Administered 2011-11-25: 0.5 mL via INTRAMUSCULAR
  Filled 2011-11-24: qty 0.5

## 2011-11-24 MED ORDER — ZOLPIDEM TARTRATE 5 MG PO TABS: 10.0000 mg | ORAL_TABLET | Freq: Every evening | ORAL | Status: DC | PRN

## 2011-11-24 SURGICAL SUPPLY — 56 items
ALLOGRAFT CA 6X14X11 (Bone Implant) ×6 IMPLANT
BAG DECANTER FOR FLEXI CONT (MISCELLANEOUS) ×2 IMPLANT
BIT DRILL NEURO 2X3.1 SFT TUCH (MISCELLANEOUS) ×2 IMPLANT
BLADE ULTRA TIP 2M (BLADE) ×2 IMPLANT
BRUSH SCRUB EZ PLAIN DRY (MISCELLANEOUS) ×2 IMPLANT
CANISTER SUCTION 2500CC (MISCELLANEOUS) ×2 IMPLANT
CLOTH BEACON ORANGE TIMEOUT ST (SAFETY) ×2 IMPLANT
CONT SPEC 4OZ CLIKSEAL STRL BL (MISCELLANEOUS) ×2 IMPLANT
COVER MAYO STAND STRL (DRAPES) ×2 IMPLANT
DECANTER SPIKE VIAL GLASS SM (MISCELLANEOUS) ×2 IMPLANT
DERMABOND ADVANCED (GAUZE/BANDAGES/DRESSINGS) ×1
DERMABOND ADVANCED .7 DNX12 (GAUZE/BANDAGES/DRESSINGS) ×1 IMPLANT
DRAPE LAPAROTOMY 100X72 PEDS (DRAPES) ×2 IMPLANT
DRAPE MICROSCOPE LEICA (MISCELLANEOUS) ×2 IMPLANT
DRAPE POUCH INSTRU U-SHP 10X18 (DRAPES) ×2 IMPLANT
DRAPE PROXIMA HALF (DRAPES) IMPLANT
DRILL NEURO 2X3.1 SOFT TOUCH (MISCELLANEOUS) ×4
ELECT COATED BLADE 2.86 ST (ELECTRODE) ×2 IMPLANT
ELECT REM PT RETURN 9FT ADLT (ELECTROSURGICAL) ×2
ELECTRODE REM PT RTRN 9FT ADLT (ELECTROSURGICAL) ×1 IMPLANT
GLOVE BIO SURGEON STRL SZ8 (GLOVE) ×2 IMPLANT
GLOVE BIOGEL PI IND STRL 8 (GLOVE) ×1 IMPLANT
GLOVE BIOGEL PI INDICATOR 8 (GLOVE) ×1
GLOVE ECLIPSE 7.5 STRL STRAW (GLOVE) ×2 IMPLANT
GLOVE EXAM NITRILE LRG STRL (GLOVE) IMPLANT
GLOVE EXAM NITRILE MD LF STRL (GLOVE) IMPLANT
GLOVE EXAM NITRILE XL STR (GLOVE) IMPLANT
GLOVE EXAM NITRILE XS STR PU (GLOVE) IMPLANT
GLOVE INDICATOR 7.0 STRL GRN (GLOVE) ×2 IMPLANT
GLOVE SS BIOGEL STRL SZ 6.5 (GLOVE) ×3 IMPLANT
GLOVE SUPERSENSE BIOGEL SZ 6.5 (GLOVE) ×3
GOWN BRE IMP SLV AUR LG STRL (GOWN DISPOSABLE) ×4 IMPLANT
GOWN BRE IMP SLV AUR XL STRL (GOWN DISPOSABLE) ×4 IMPLANT
GOWN STRL REIN 2XL LVL4 (GOWN DISPOSABLE) IMPLANT
HEAD HALTER (SOFTGOODS) ×2 IMPLANT
HEMOSTAT POWDER KIT SURGIFOAM (HEMOSTASIS) ×2 IMPLANT
KIT BASIN OR (CUSTOM PROCEDURE TRAY) ×2 IMPLANT
KIT ROOM TURNOVER OR (KITS) ×2 IMPLANT
NEEDLE HYPO 25X1 1.5 SAFETY (NEEDLE) ×2 IMPLANT
NEEDLE SPNL 22GX3.5 QUINCKE BK (NEEDLE) ×2 IMPLANT
NS IRRIG 1000ML POUR BTL (IV SOLUTION) ×2 IMPLANT
PACK LAMINECTOMY NEURO (CUSTOM PROCEDURE TRAY) ×2 IMPLANT
PAD ARMBOARD 7.5X6 YLW CONV (MISCELLANEOUS) ×6 IMPLANT
RUBBERBAND STERILE (MISCELLANEOUS) ×4 IMPLANT
SCREW FIX 4.0X13MM (Screw) ×4 IMPLANT
SPONGE INTESTINAL PEANUT (DISPOSABLE) ×2 IMPLANT
SPONGE SURGIFOAM ABS GEL 100 (HEMOSTASIS) ×2 IMPLANT
STAPLER SKIN PROX WIDE 3.9 (STAPLE) ×2 IMPLANT
SUT VIC AB 0 CT1 18XCR BRD8 (SUTURE) IMPLANT
SUT VIC AB 0 CT1 8-18 (SUTURE)
SUT VIC AB 2-0 CP2 18 (SUTURE) ×2 IMPLANT
SUT VIC AB 3-0 SH 8-18 (SUTURE) ×2 IMPLANT
SYR 20ML ECCENTRIC (SYRINGE) ×2 IMPLANT
TOWEL OR 17X24 6PK STRL BLUE (TOWEL DISPOSABLE) IMPLANT
TOWEL OR 17X26 10 PK STRL BLUE (TOWEL DISPOSABLE) ×2 IMPLANT
WATER STERILE IRR 1000ML POUR (IV SOLUTION) ×2 IMPLANT

## 2011-11-24 NOTE — Preoperative (Signed)
Beta Blockers   Reason not to administer Beta Blockers:Metoprolol taken this morning

## 2011-11-24 NOTE — Progress Notes (Signed)
Filed Vitals:   11/24/11 0854 11/24/11 1639 11/24/11 1645 11/24/11 1700  BP: 137/86 140/67 140/67 153/75  Pulse: 76 91 91 80  Temp: 98 F (36.7 C) 97.2 F (36.2 C)    TempSrc: Oral     Resp: 20 17 14 16   SpO2: 99% 100% 100% 99%    Patient resting comfortably in PACU. Not having a lot of pain. Incision clean dry. In soft cervical collar. Foley straight drainage. Moving all extremities well, with good strength.  Plan: For transfer to 3500 units once stable in PACU.  Hewitt Shorts, MD 11/24/2011, 5:17 PM

## 2011-11-24 NOTE — Op Note (Signed)
11/24/2011  4:27 PM  PATIENT:  Courtney Thornton  46 y.o. female  PRE-OPERATIVE DIAGNOSIS:  cervical herniated disc with myelopathy, cervical spondylosis with myelopathy, cervical stenosis, cervical radiculopathy  POST-OPERATIVE DIAGNOSIS:  cervical herniated disc with myelopathy, cervical spondylosis with myelopathy, cervical stenosis, cervical radiculopathy  PROCEDURE:  Procedure(s): ANTERIOR CERVICAL DECOMPRESSION/DISCECTOMY FUSION 3 LEVELS: C3-4, C4-5, and C5-6 anterior cervical decompression and arthrodesis with allograft and tether cervical plating  SURGEON:  Surgeon(s): Hewitt Shorts, MD Tia Alert, MD  ASSISTANTS: Tia Alert, M.D.  ANESTHESIA:   general  EBL:  Total I/O In: 2000 [I.V.:2000] Out: 350 [Urine:150; Blood:200]  BLOOD ADMINISTERED:none  COUNT: Correct per nursing staff  DICTATION: Patient was brought to the operating room placed under general endotracheal anesthesia. Patient was placed in 10 pounds of halter traction. The neck was prepped with Betadine soap and solution and draped in a sterile fashion. A horizontal incision was made on the left side of the neck. The line of the incision was infiltrated with local anesthetic with epinephrine. Dissection was carried down thru the subcutaneous tissue and platysma, bipolar cautery was used to maintain hemostasis. Dissection was then carried out thru an avascular plane leaving the sternocleidomastoid, carotid artery, and jugular vein laterally and the trachea and esophagus medially. The ventral aspect of the vertebral column was identified and a localizing x-ray was taken. The C3-4, C4-5, and C5-6 levels were identified. The annulus at each level was incised and the disc space entered. Discectomy was performed with micro-curettes and pituitary rongeurs. The operating microscope was draped and brought into the field provided additional magnification illumination and visualization. At each level there was  significant spondylitic degeneration, with marked disc space narrowing, ventral osteophytes, and large posterior osteophytes and spondylitic disc herniation. Discectomy was continued posteriorly thru the disc space and then the cartilaginous endplate was removed using micro-curettes along with the high-speed drill. Posterior osteophytic overgrowth was removed at each level using the high-speed drill along with a 1 mm and 2 mm thin footplated Kerrison punches. Posterior longitudinal ligament along with disc herniation was carefully removed, decompressing the spinal canal and thecal sac. We then continued to remove osteophytic overgrowth and disc material decompressing the neural foramina and exiting nerve roots bilaterally. Once the decompression was completed hemostasis was established at each level with the use of Gelfoam with thrombin. The Gelfoam was removed, the wound irrigated, and hemostasis confirmed. We then measured the height of each intravertebral disc space level and selected a 6 millimeter in height structural allograft for the C3-4 level, a 6 millimeter in height structural allograft for the C4-5 level, and a 6 millimeter in height structural allograft for the C5-6 level . Each was hydrated in saline solution and then gently positioned in the intravertebral disc space and countersunk. We then selected a 43 millimeter in height Tether cervical plate. It was positioned over the fusion construct and secured to the vertebra with 4 x 13 mm screws. We placed a pair of fixed screws at C3-4, a single variable screw at C4, a single fixed screw at C5, and a pair of variable screws at C6, Each screw hole was started with the high-speed drill and then the screws placed, once all the screws were placed final tightening was performed. The wound was irrigated with bacitracin solution checked for hemostasis which was established and confirmed. An x-ray was taken which showed the graft in position and the plate and  screws in good position, however I was concerned about the  screws at C3-4 and whether the locking ring was fully engaged. Therefore we removed the screws at C3-4 and again placed a pair of 4 x 13 mm fixed screws, and this time the locking ring fully engaged. The x-ray was repeated and the overall construct looked good. We then proceeded with closure. The platysma was closed with interrupted inverted 2-0 undyed Vicryl suture, the subcutaneous and subcuticular closed with interrupted inverted 3-0 undyed Vicryl suture. The skin edges were approximated with Dermabond. Following surgery the patient was taken out of cervical traction. To be reversed and the anesthetic and taken to the recovery room for further care.   PLAN OF CARE: Admit for overnight observation  PATIENT DISPOSITION:  PACU - hemodynamically stable.   Delay start of Pharmacological VTE agent (>24hrs) due to surgical blood loss or risk of bleeding:  yes

## 2011-11-24 NOTE — Anesthesia Preprocedure Evaluation (Addendum)
Anesthesia Evaluation  Patient identified by MRN, date of birth, ID band Patient awake    Reviewed: Allergy & Precautions, H&P , NPO status , Patient's Chart, lab work & pertinent test results, reviewed documented beta blocker date and time   History of Anesthesia Complications (+) PONV  Airway Mallampati: II TM Distance: >3 FB Neck ROM: Limited    Dental  (+) Teeth Intact and Dental Advisory Given   Pulmonary  breath sounds clear to auscultation        Cardiovascular hypertension, Pt. on home beta blockers + CAD Rhythm:Regular Rate:Normal     Neuro/Psych  Headaches, TIA   GI/Hepatic GERD-  Medicated,  Endo/Other    Renal/GU      Musculoskeletal   Abdominal   Peds  Hematology   Anesthesia Other Findings   Reproductive/Obstetrics                          Anesthesia Physical Anesthesia Plan  ASA: III  Anesthesia Plan: General   Post-op Pain Management:    Induction: Intravenous  Airway Management Planned: Oral ETT  Additional Equipment:   Intra-op Plan:   Post-operative Plan: Extubation in OR  Informed Consent: I have reviewed the patients History and Physical, chart, labs and discussed the procedure including the risks, benefits and alternatives for the proposed anesthesia with the patient or authorized representative who has indicated his/her understanding and acceptance.     Plan Discussed with: CRNA and Surgeon  Anesthesia Plan Comments: (Chest pain, No CAD by cath 04/22/10 Htn Smoker/COPD  Anxiety  GERD H/O post-op N/V Severe Cervical Spondylosis with myelopathic symptoms  Plan GA  Kipp Brood, MD  )       Anesthesia Quick Evaluation

## 2011-11-24 NOTE — Transfer of Care (Signed)
Immediate Anesthesia Transfer of Care Note  Patient: Courtney Thornton  Procedure(s) Performed: Procedure(s) (LRB): ANTERIOR CERVICAL DECOMPRESSION/DISCECTOMY FUSION 3 LEVELS (N/A)  Patient Location: PACU  Anesthesia Type: General  Level of Consciousness: awake, alert , oriented and sedated  Airway & Oxygen Therapy: Patient Spontanous Breathing and Patient connected to nasal cannula oxygen  Post-op Assessment: Report given to PACU RN, Post -op Vital signs reviewed and stable and Patient moving all extremities  Post vital signs: Reviewed and stable  Complications: No apparent anesthesia complications

## 2011-11-24 NOTE — Anesthesia Postprocedure Evaluation (Signed)
  Anesthesia Post-op Note  Patient: Courtney Thornton  Procedure(s) Performed: Procedure(s) (LRB): ANTERIOR CERVICAL DECOMPRESSION/DISCECTOMY FUSION 3 LEVELS (N/A)  Patient Location: PACU  Anesthesia Type: General  Level of Consciousness: awake, alert  and oriented  Airway and Oxygen Therapy: Patient Spontanous Breathing and Patient connected to nasal cannula oxygen  Post-op Pain: mild  Post-op Assessment: Post-op Vital signs reviewed and Patient's Cardiovascular Status Stable  Post-op Vital Signs: stable  Complications: No apparent anesthesia complications

## 2011-11-24 NOTE — H&P (Signed)
Subjective: Patient is a 46 y.o. female who is admitted for treatment of multilevel cervical spondylosis and degenerative disc disease with multilevel cervical stenosis. There are degenerative changes seen at all 6 cervical levels including C2-3, C3-4, C4-5, C5-6, C6-7, and C7-T1. However the worst stenosis is seen at C3-4, C4-5, and C5-6.  After extensive review and consultation a decision is made to proceed with a three-level C3-4, C4-5, and C5-6 anterior cervical decompression and arthrodesis with allograft and cervical plating. We realized that patient may eventually require decompression at the C6-7 and C7-T1 levels and possibly require posterior cervical decompression (laminectomy) and arthrodesis, but feel that this more limited approach will be best at this time  Symptomatically she's been having pain in the back of her neck, worse the left side, as well as interscapular pain, again worse on the left side. She also has complaints of low back pain radiating into the left lower extremity.   Past Medical History  Diagnosis Date  . Cancer     cervical  . PONV (postoperative nausea and vomiting)   . Hypertension     takes Metoprolol and Lisinopril occ  . Hyperlipidemia     doesn't take any meds at present time  . Emphysema   . Shortness of breath     with exertion  . Bronchitis     hx of-couple of years ago  . Headache   . Migraines     last on e3/10/13  . Dizziness     hx of  . Confusion   . Stroke     TIA-22 yrs ago  . Arthritis     shoulders  . Joint pain   . Joint swelling   . Neck pain     stenosis and radiculopathy  . Bruises easily     platelets are always high  . GERD (gastroesophageal reflux disease)     takes Omeprazole and Ranitidine daily  . Gastric ulcer   . Diarrhea   . History of kidney stones 2000  . Blood transfusion 2004  . Anxiety     takes Xanax daily  . Depression     takes Zoloft nightly  . Insomnia     xanax and pain pills help    Past  Surgical History  Procedure Date  . Ectopic pregnancy surgery 1987/1989  . Cervical cone biopsy 20+yrs ago    d/t cervical cancer  . Abdominal hysterectomy 2004    partial d/t fibroid tumors  . Breast surgery 2002    breast reduction  . Right hand 90's    cyst removed from top of hand    Prescriptions prior to admission  Medication Sig Dispense Refill  . albuterol (PROVENTIL HFA;VENTOLIN HFA) 108 (90 BASE) MCG/ACT inhaler Inhale 2 puffs into the lungs every 6 (six) hours as needed. For shortness of breath      . ALPRAZolam (XANAX) 0.5 MG tablet Take 0.5 mg by mouth 4 (four) times daily as needed. Stress      . cyclobenzaprine (FLEXERIL) 10 MG tablet Take 10 mg by mouth 3 (three) times daily as needed. For back spasms      . lisinopril-hydrochlorothiazide (PRINZIDE,ZESTORETIC) 20-12.5 MG per tablet Take 1 tablet by mouth daily.      . metoprolol tartrate (LOPRESSOR) 25 MG tablet Take 25 mg by mouth 2 (two) times daily.      . Multiple Vitamin (MULITIVITAMIN WITH MINERALS) TABS Take 1 tablet by mouth daily.      . naproxen (NAPROSYN) 500  MG tablet Take 500 mg by mouth 2 (two) times daily with a meal.      . nitroGLYCERIN (NITROSTAT) 0.4 MG SL tablet Place 0.4 mg under the tongue every 5 (five) minutes as needed. For chest pain      . omeprazole (PRILOSEC) 20 MG capsule Take 20-40 mg by mouth 2 (two) times daily as needed. Acid refux      . ranitidine (ZANTAC) 150 MG tablet Take 150 mg by mouth daily.      . sertraline (ZOLOFT) 50 MG tablet Take 50 mg by mouth daily as needed. depression       Allergies  Allergen Reactions  . Codeine Nausea And Vomiting    History  Substance Use Topics  . Smoking status: Current Everyday Smoker -- 0.5 packs/day for 32 years  . Smokeless tobacco: Not on file  . Alcohol Use: 0.6 oz/week    1 Cans of beer per week     1 beer daily    Family History  Problem Relation Age of Onset  . Anesthesia problems Neg Hx   . Hypotension Neg Hx   . Malignant  hyperthermia Neg Hx   . Pseudochol deficiency Neg Hx      Review of Systems A comprehensive review of systems was negative.  Objective: Vital signs in last 24 hours: Temp:  [98 F (36.7 C)] 98 F (36.7 C) (03/13 0854) Pulse Rate:  [76] 76  (03/13 0854) Resp:  [20] 20  (03/13 0854) BP: (137)/(86) 137/86 mmHg (03/13 0854) SpO2:  [99 %] 99 % (03/13 0854)  EXAM: Patient is a well-developed well-nourished black female in no acute distress. Lungs are clear to auscultation , the patient has symmetrical respiratory excursion. Heart has a regular rate and rhythm normal S1 and S2 no murmur.   Abdomen is soft nontender nondistended bowel sounds are present. Extremity examination shows no clubbing cyanosis or edema. Musculoskeletal examination shows good range of motion neck flexion extension and lateral flexion to either side. Mild tenderness to palpation in the posterior neck without specific point tenderness. Motor exam shows 5 over 5 strength in the upper and lower extremities including the deltoid, biceps, triceps, intrinsics, and grips, iliopsoas, quadriceps, dorsiflexor, EHL, and plantar flexor bilaterally. Sensation is intact to pinprick to the digits of the upper extremities as well in the distal lower extremities. Reflexes examination shows the biceps and brachialis a minimal bilaterally. Triceps are 1-2 bilaterally. Left quadriceps of 3-4, right quadriceps is 2-3, gastrotomies are 1 bilaterally, and toes are upgoing bilaterally, left slightly more vigorous than right. She has a normal gait and stance.  Data Review:CBC    Component Value Date/Time   WBC 9.0 11/22/2011 0934   RBC 4.28 11/22/2011 0934   HGB 13.8 11/22/2011 0934   HCT 40.2 11/22/2011 0934   PLT 279 11/22/2011 0934   MCV 93.9 11/22/2011 0934   MCH 32.2 11/22/2011 0934   MCHC 34.3 11/22/2011 0934   RDW 14.1 11/22/2011 0934   LYMPHSABS 3.0 12/23/2009 0903   MONOABS 0.4 12/23/2009 0903   EOSABS 0.1 12/23/2009 0903   BASOSABS 0.1  12/23/2009 0903                          BMET    Component Value Date/Time   NA 138 11/22/2011 0934   K 3.3* 11/22/2011 0934   CL 101 11/22/2011 0934   CO2 24 11/22/2011 0934   GLUCOSE 79 11/22/2011 0934   BUN  7 11/22/2011 0934   CREATININE 0.50 11/22/2011 0934   CALCIUM 9.1 11/22/2011 0934   GFRNONAA >90 11/22/2011 0934   GFRAA >90 11/22/2011 1324     Assessment/Plan: Patient is admitted for a three-level C3-4, C4-5, and C5-6 anterior cervical decompression and arthrodesis with allograft and cervical plating. I've discussed with the patient the nature of his condition, the nature the surgical procedure, the typical length of surgery, hospital stay, and overall recuperation. We discussed limitations postoperatively. I discussed risks of surgery including risks of infection, bleeding, possibly need for transfusion, the risk of nerve root dysfunction with pain, weakness, numbness, or paresthesias, the risk of spinal cord dysfunction with paralysis of all 4 limbs and quadriplegia, and the risk of dural tear and CSF leakage and possible need for further surgery, the risk of esophageal dysfunction causing dysphagia and the risk of laryngeal dysfunction causing hoarseness of the voice, the risk of failure of the arthrodesis and the possible need for further surgery, and the risk of anesthetic complications including myocardial infarction, stroke, pneumonia, and death. We also discussed the need for postoperative immobilization in a cervical collar. Understanding all this the patient does wish to proceed with surgery and is admitted for such.    Hewitt Shorts, MD 11/24/2011 9:21 AM

## 2011-11-25 NOTE — Progress Notes (Signed)
Filed Vitals:   11/25/11 0000 11/25/11 0400 11/25/11 0800 11/25/11 1200  BP: 138/69 123/95 99/54 116/71  Pulse: 89 89 79 76  Temp: 98.6 F (37 C) 99 F (37.2 C) 98.8 F (37.1 C) 98.6 F (37 C)  TempSrc: Oral Oral Oral Oral  Resp: 16 16 18 18   SpO2: 95% 94% 98% 93%    Patient up and living in the halls. Wound is clean dry. Voiding well.  Plan: Encouraged continued ambulate regularly in the halls. We'll continue to progress through postoperative recovery.  Hewitt Shorts, MD 11/25/2011, 2:26 PM

## 2011-11-26 MED ORDER — CYCLOBENZAPRINE HCL 10 MG PO TABS: 10.0000 mg | ORAL_TABLET | Freq: Three times a day (TID) | ORAL | Status: AC | PRN

## 2011-11-26 MED ORDER — OXYCODONE-ACETAMINOPHEN 5-325 MG PO TABS: 1.0000 | ORAL_TABLET | ORAL | Status: AC | PRN

## 2011-11-26 NOTE — Discharge Summary (Signed)
Physician Discharge Summary  Patient ID: Courtney Thornton MRN: [id number] DOB/AGE: 1965-12-15 46 y.o.  Admit date: 11/24/2011 Discharge date: 11/26/2011  Admission Diagnoses: Cervical herniated disc with myelopathy, cervical spondylosis with myelopathy, cervical stenosis, cervical radiculopathy  Discharge Diagnoses:  Cervical herniated disc with myelopathy, cervical spondylosis with myelopathy, cervical stenosis, cervical radiculopathy  Discharged Condition: good  Hospital Course: Patient was admitted underwent a three-level C3-4, C4-5, and C5-6 intracervical decompression and arthrodesis with allograft and tether cervical plate. She has done well following surgery. She has steadily increased her ambulation activity. Her wound is healing well. There is no swelling erythema or drainage. She's been given instructions regarding wound care and activities following discharge. She is to return for followup with me in 3 weeks or sooner she has increased difficulties.  Discharge Exam: Blood pressure 156/90, pulse 83, temperature 98.8 F (37.1 C), temperature source Oral, resp. rate 20, SpO2 95.00%.  Disposition: Home   Medication List  As of 11/26/2011  7:57 AM   TAKE these medications         albuterol 108 (90 BASE) MCG/ACT inhaler   Commonly known as: PROVENTIL HFA;VENTOLIN HFA   Inhale 2 puffs into the lungs every 6 (six) hours as needed. For shortness of breath      ALPRAZolam 0.5 MG tablet   Commonly known as: XANAX   Take 0.5 mg by mouth 4 (four) times daily as needed. Stress      cyclobenzaprine 10 MG tablet   Commonly known as: FLEXERIL   Take 10 mg by mouth 3 (three) times daily as needed. For back spasms      cyclobenzaprine 10 MG tablet   Commonly known as: FLEXERIL   Take 1 tablet (10 mg total) by mouth 3 (three) times daily as needed for muscle spasms.      lisinopril-hydrochlorothiazide 20-12.5 MG per tablet   Commonly known as: PRINZIDE,ZESTORETIC   Take 1 tablet by  mouth daily.      metoprolol tartrate 25 MG tablet   Commonly known as: LOPRESSOR   Take 25 mg by mouth 2 (two) times daily.      mulitivitamin with minerals Tabs   Take 1 tablet by mouth daily.      naproxen 500 MG tablet   Commonly known as: NAPROSYN   Take 500 mg by mouth 2 (two) times daily with a meal.      nitroGLYCERIN 0.4 MG SL tablet   Commonly known as: NITROSTAT   Place 0.4 mg under the tongue every 5 (five) minutes as needed. For chest pain      omeprazole 20 MG capsule   Commonly known as: PRILOSEC   Take 20-40 mg by mouth 2 (two) times daily as needed. Acid refux      oxyCODONE-acetaminophen 5-325 MG per tablet   Commonly known as: PERCOCET   Take 1-2 tablets by mouth every 4 (four) hours as needed for pain.      ranitidine 150 MG tablet   Commonly known as: ZANTAC   Take 150 mg by mouth daily.      sertraline 50 MG tablet   Commonly known as: ZOLOFT   Take 50 mg by mouth daily as needed. depression             Signed: Hewitt Shorts, MD 11/26/2011, 7:57 AM

## 2011-11-26 NOTE — Discharge Instructions (Signed)

## 2011-11-29 ENCOUNTER — Encounter (HOSPITAL_COMMUNITY): Payer: Self-pay | Admitting: Neurosurgery

## 2014-01-09 ENCOUNTER — Emergency Department (HOSPITAL_COMMUNITY)
Admission: EM | Admit: 2014-01-09 | Discharge: 2014-01-09 | Disposition: A | Discharge status: Home / Self Care | Payer: Medicare Other | Attending: Emergency Medicine | Admitting: Emergency Medicine

## 2014-01-09 ENCOUNTER — Encounter (HOSPITAL_COMMUNITY): Payer: Self-pay | Admitting: Emergency Medicine

## 2014-01-09 ENCOUNTER — Emergency Department (HOSPITAL_COMMUNITY): Payer: Medicare Other

## 2014-01-09 DIAGNOSIS — R52 Pain, unspecified: Secondary | ICD-10-CM | POA: Insufficient documentation

## 2014-01-09 DIAGNOSIS — G8929 Other chronic pain: Secondary | ICD-10-CM | POA: Insufficient documentation

## 2014-01-09 DIAGNOSIS — F3289 Other specified depressive episodes: Secondary | ICD-10-CM | POA: Insufficient documentation

## 2014-01-09 DIAGNOSIS — Z8541 Personal history of malignant neoplasm of cervix uteri: Secondary | ICD-10-CM | POA: Insufficient documentation

## 2014-01-09 DIAGNOSIS — Z791 Long term (current) use of non-steroidal anti-inflammatories (NSAID): Secondary | ICD-10-CM | POA: Insufficient documentation

## 2014-01-09 DIAGNOSIS — F411 Generalized anxiety disorder: Secondary | ICD-10-CM | POA: Insufficient documentation

## 2014-01-09 DIAGNOSIS — G43909 Migraine, unspecified, not intractable, without status migrainosus: Secondary | ICD-10-CM | POA: Insufficient documentation

## 2014-01-09 DIAGNOSIS — G4700 Insomnia, unspecified: Secondary | ICD-10-CM | POA: Insufficient documentation

## 2014-01-09 DIAGNOSIS — Z79899 Other long term (current) drug therapy: Secondary | ICD-10-CM | POA: Insufficient documentation

## 2014-01-09 DIAGNOSIS — Z862 Personal history of diseases of the blood and blood-forming organs and certain disorders involving the immune mechanism: Secondary | ICD-10-CM | POA: Insufficient documentation

## 2014-01-09 DIAGNOSIS — M545 Low back pain, unspecified: Secondary | ICD-10-CM | POA: Insufficient documentation

## 2014-01-09 DIAGNOSIS — Z8639 Personal history of other endocrine, nutritional and metabolic disease: Secondary | ICD-10-CM | POA: Insufficient documentation

## 2014-01-09 DIAGNOSIS — K219 Gastro-esophageal reflux disease without esophagitis: Secondary | ICD-10-CM | POA: Insufficient documentation

## 2014-01-09 DIAGNOSIS — F329 Major depressive disorder, single episode, unspecified: Secondary | ICD-10-CM | POA: Insufficient documentation

## 2014-01-09 DIAGNOSIS — F17200 Nicotine dependence, unspecified, uncomplicated: Secondary | ICD-10-CM | POA: Insufficient documentation

## 2014-01-09 DIAGNOSIS — M62838 Other muscle spasm: Secondary | ICD-10-CM

## 2014-01-09 DIAGNOSIS — I10 Essential (primary) hypertension: Secondary | ICD-10-CM | POA: Insufficient documentation

## 2014-01-09 DIAGNOSIS — Z8673 Personal history of transient ischemic attack (TIA), and cerebral infarction without residual deficits: Secondary | ICD-10-CM | POA: Insufficient documentation

## 2014-01-09 DIAGNOSIS — Z87442 Personal history of urinary calculi: Secondary | ICD-10-CM | POA: Insufficient documentation

## 2014-01-09 DIAGNOSIS — J438 Other emphysema: Secondary | ICD-10-CM | POA: Insufficient documentation

## 2014-01-09 DIAGNOSIS — M19019 Primary osteoarthritis, unspecified shoulder: Secondary | ICD-10-CM | POA: Insufficient documentation

## 2014-01-09 LAB — BASIC METABOLIC PANEL
BUN: 11 mg/dL (ref 6–23)
CO2: 26 meq/L (ref 19–32)
Calcium: 9.2 mg/dL (ref 8.4–10.5)
Chloride: 95 mEq/L — ABNORMAL LOW (ref 96–112)
Creatinine, Ser: 0.65 mg/dL (ref 0.50–1.10)
GFR calc Af Amer: >90 mL/min (ref >90)
GLUCOSE: 92 mg/dL (ref 70–99)
POTASSIUM: 4.1 meq/L (ref 3.7–5.3)
SODIUM: 135 meq/L — AB (ref 137–147)

## 2014-01-09 MED ORDER — HYDROCODONE-ACETAMINOPHEN 5-325 MG PO TABS
2.0000 | ORAL_TABLET | Freq: Once | ORAL | Status: AC
  Administered 2014-01-09: 2 via ORAL
  Filled 2014-01-09: qty 2

## 2014-01-09 MED ORDER — HYDROCODONE-ACETAMINOPHEN 5-325 MG PO TABS: 1.0000 | ORAL_TABLET | ORAL | Status: DC | PRN

## 2014-01-09 MED ORDER — IBUPROFEN 600 MG PO TABS: 600.0000 mg | ORAL_TABLET | Freq: Four times a day (QID) | ORAL | Status: DC | PRN

## 2014-01-09 NOTE — ED Notes (Signed)
Lower back pain, worsening x 2 wks.  Denies new injury.

## 2014-01-09 NOTE — ED Notes (Signed)
Pt with lower back that has gotten worse over last couple of weeks, had neck surgery 2013 and suppose to have back surgery at some point, c/o numbness to bilateral feet, denies incont of urine or bowels

## 2014-01-09 NOTE — ED Provider Notes (Signed)
CSN: [id number]     Arrival date & time 01/09/14  1719 History   First MD Initiated Contact with Patient 01/09/14 1732     Chief Complaint  Patient presents with  . Back Pain     (Consider location/radiation/quality/duration/timing/severity/associated sxs/prior Treatment) The history is provided by the patient.   Courtney Thornton is a 48 y.o. female presenting with  acute on chronic intermittent low back pain which has which has been worsened for the past 2 weeks.   Patient denies any new injury specifically.  There is no radiation into her lower extremities.  There has been no weakness or numbness in the lower extremities and no urinary or bowel retention or incontinence.  Patient does not have a history of  IVDU, but does have a distant history of cervical cancer.   The patient has tried the followed medicines and/or treatments without relief of pain: tylenol, motrin and aleve.  She also has complaints of increasing lower extremity intermittent muscle spasms over the past several weeks.  She denies any reason for dehydration, states she drinks plenty of fluids.  She is on Prinzide for blood pressure control which includes HCTZ and has been on this medication for over a year.     Past Medical History  Diagnosis Date  . Cancer     cervical  . PONV (postoperative nausea and vomiting)   . Hypertension     takes Metoprolol and Lisinopril occ  . Hyperlipidemia     doesn't take any meds at present time  . Emphysema   . Shortness of breath     with exertion  . Bronchitis     hx of-couple of years ago  . Headache(784.0)   . Migraines     last on e3/10/13  . Dizziness     hx of  . Confusion   . Stroke     TIA-22 yrs ago  . Arthritis     shoulders  . Joint pain   . Joint swelling   . Neck pain     stenosis and radiculopathy  . Bruises easily     platelets are always high  . GERD (gastroesophageal reflux disease)     takes Omeprazole and Ranitidine daily  . Gastric ulcer    . Diarrhea   . History of kidney stones 2000  . Blood transfusion 2004  . Anxiety     takes Xanax daily  . Depression     takes Zoloft nightly  . Insomnia     xanax and pain pills help   Past Surgical History  Procedure Laterality Date  . Ectopic pregnancy surgery  1987/1989  . Cervical cone biopsy  20+yrs ago    d/t cervical cancer  . Abdominal hysterectomy  2004    partial d/t fibroid tumors  . Breast surgery  2002    breast reduction  . Right hand  90's    cyst removed from top of hand  . Anterior cervical decomp/discectomy fusion  11/24/2011    Procedure: ANTERIOR CERVICAL DECOMPRESSION/DISCECTOMY FUSION 3 LEVELS;  Surgeon: Hosie Spangle, MD;  Location: [facility];  Service: Neurosurgery;  Laterality: N/A;  Cervical three-four, four-five,five-six anterior cervical decompression with fusion plating and bonegraft   Family History  Problem Relation Age of Onset  . Anesthesia problems Neg Hx   . Hypotension Neg Hx   . Malignant hyperthermia Neg Hx   . Pseudochol deficiency Neg Hx    History  Substance Use Topics  . Smoking  status: Current Every Day Smoker -- 0.50 packs/day for 32 years    Types: Cigarettes  . Smokeless tobacco: Not on file  . Alcohol Use: 0.6 oz/week    1 Cans of beer per week     Comment: 1 beer daily   OB History   Grav Para Term Preterm Abortions TAB SAB Ect Mult Living                 Review of Systems  Constitutional: Negative for fever.  Respiratory: Negative for shortness of breath.   Cardiovascular: Negative for chest pain and leg swelling.  Gastrointestinal: Negative for abdominal pain, constipation and abdominal distention.  Genitourinary: Negative for dysuria, urgency, frequency, flank pain and difficulty urinating.  Musculoskeletal: Positive for back pain. Negative for gait problem and joint swelling.  Skin: Negative for rash.  Neurological: Negative for weakness and numbness.      Allergies  Codeine  Home Medications    Prior to Admission medications   Medication Sig Start Date End Date Taking? Authorizing Provider  albuterol (PROVENTIL HFA;VENTOLIN HFA) 108 (90 BASE) MCG/ACT inhaler Inhale 2 puffs into the lungs every 6 (six) hours as needed. For shortness of breath    Historical Provider, MD  ALPRAZolam Duanne Moron) 0.5 MG tablet Take 0.5 mg by mouth 4 (four) times daily as needed. Stress    Historical Provider, MD  cyclobenzaprine (FLEXERIL) 10 MG tablet Take 10 mg by mouth 3 (three) times daily as needed. For back spasms    Historical Provider, MD  HYDROcodone-acetaminophen (NORCO/VICODIN) 5-325 MG per tablet Take 1 tablet by mouth every 4 (four) hours as needed. 01/09/14   Evalee Jefferson, PA-C  ibuprofen (ADVIL,MOTRIN) 600 MG tablet Take 1 tablet (600 mg total) by mouth every 6 (six) hours as needed. 01/09/14   Evalee Jefferson, PA-C  lisinopril-hydrochlorothiazide (PRINZIDE,ZESTORETIC) 20-12.5 MG per tablet Take 1 tablet by mouth daily.    Historical Provider, MD  metoprolol tartrate (LOPRESSOR) 25 MG tablet Take 25 mg by mouth 2 (two) times daily.    Historical Provider, MD  Multiple Vitamin (MULITIVITAMIN WITH MINERALS) TABS Take 1 tablet by mouth daily.    Historical Provider, MD  naproxen (NAPROSYN) 500 MG tablet Take 500 mg by mouth 2 (two) times daily with a meal.    Historical Provider, MD  nitroGLYCERIN (NITROSTAT) 0.4 MG SL tablet Place 0.4 mg under the tongue every 5 (five) minutes as needed. For chest pain    Historical Provider, MD  omeprazole (PRILOSEC) 20 MG capsule Take 20-40 mg by mouth 2 (two) times daily as needed. Acid refux    Historical Provider, MD  ranitidine (ZANTAC) 150 MG tablet Take 150 mg by mouth daily.    Historical Provider, MD  sertraline (ZOLOFT) 50 MG tablet Take 50 mg by mouth daily as needed. depression    Historical Provider, MD   BP 154/92  Pulse 86  Temp(Src) 97.9 F (36.6 C) (Oral)  Resp 18  Ht 5' (1.524 m)  Wt 111 lb (50.349 kg)  BMI 21.68 kg/m2  SpO2 99% Physical Exam   Nursing note and vitals reviewed. Constitutional: She appears well-developed and well-nourished.  HENT:  Head: Normocephalic.  Eyes: Conjunctivae are normal.  Neck: Normal range of motion. Neck supple.  Cardiovascular: Normal rate and intact distal pulses.   Pedal pulses normal.  Pulmonary/Chest: Effort normal.  Abdominal: Soft. Bowel sounds are normal. She exhibits no distension and no mass.  Musculoskeletal: Normal range of motion. She exhibits no edema.  Right shoulder: She exhibits bony tenderness.       Lumbar back: She exhibits tenderness. She exhibits no swelling, no edema and no spasm.  ttp midline lower back. No deformity. No SI joint tenderness.  Neurological: She is alert. She has normal strength. She displays no atrophy and no tremor. No sensory deficit. Gait normal.  Reflex Scores:      Patellar reflexes are 2+ on the right side and 2+ on the left side.      Achilles reflexes are 2+ on the right side and 2+ on the left side. No strength deficit noted in hip and knee flexor and extensor muscle groups.  Ankle flexion and extension intact.  Skin: Skin is warm and dry.  Psychiatric: She has a normal mood and affect.    ED Course  Procedures (including critical care time) Labs Review Labs Reviewed  BASIC METABOLIC PANEL - Abnormal; Notable for the following:    Sodium 135 (*)    Chloride 95 (*)    All other components within normal limits    Imaging Review No results found.   EKG Interpretation None      MDM   Final diagnoses:  Lumbar back pain  Muscle spasm of both lower legs    Pt has been followed in the past by Dr. Sherwood Gambler and she has known degenerative disc disease in her lumbar spine.  MRI completed in 2013 showed advanced degenerative disease.  She is planning to call him tomorrow to reestablish care and potentially consider surgery which is recommended in the past.  She was prescribed hydrocodone, ibuprofen for pain relief.  X-rays are  negative.  No neuro deficit on exam or by history to suggest emergent or surgical presentation.  Also discussed worsened sx that should prompt immediate re-evaluation including distal weakness, bowel/bladder retention/incontinence.         Evalee Jefferson, PA-C 01/09/14 2057

## 2014-01-09 NOTE — Discharge Instructions (Signed)
Back Pain, Adult Low back pain is very common. About 1 in 5 people have back pain.The cause of low back pain is rarely dangerous. The pain often gets better over time.About half of people with a sudden onset of back pain feel better in just 2 weeks. About 8 in 10 people feel better by 6 weeks.  CAUSES Some common causes of back pain include:  Strain of the muscles or ligaments supporting the spine.  Wear and tear (degeneration) of the spinal discs.  Arthritis.  Direct injury to the back. DIAGNOSIS Most of the time, the direct cause of low back pain is not known.However, back pain can be treated effectively even when the exact cause of the pain is unknown.Answering your caregiver's questions about your overall health and symptoms is one of the most accurate ways to make sure the cause of your pain is not dangerous. If your caregiver needs more information, he or she may order lab work or imaging tests (X-rays or MRIs).However, even if imaging tests show changes in your back, this usually does not require surgery. HOME CARE INSTRUCTIONS For many people, back pain returns.Since low back pain is rarely dangerous, it is often a condition that people can learn to [facility] their own.   Remain active. It is stressful on the back to sit or stand in one place. Do not sit, drive, or stand in one place for more than 30 minutes at a time. Take short walks on level surfaces as soon as pain allows.Try to increase the length of time you walk each day.  Do not stay in bed.Resting more than 1 or 2 days can delay your recovery.  Do not avoid exercise or work.Your body is made to move.It is not dangerous to be active, even though your back may hurt.Your back will likely heal faster if you return to being active before your pain is gone.  Pay attention to your body when you bend and lift. Many people have less discomfortwhen lifting if they bend their knees, keep the load close to their bodies,and  avoid twisting. Often, the most comfortable positions are those that put less stress on your recovering back.  Find a comfortable position to sleep. Use a firm mattress and lie on your side with your knees slightly bent. If you lie on your back, put a pillow under your knees.  Only take over-the-counter or prescription medicines as directed by your caregiver. Over-the-counter medicines to reduce pain and inflammation are often the most helpful.Your caregiver may prescribe muscle relaxant drugs.These medicines help dull your pain so you can more quickly return to your normal activities and healthy exercise.  Put ice on the injured area.  Put ice in a plastic bag.  Place a towel between your skin and the bag.  Leave the ice on for 15-20 minutes, 03-04 times a day for the first 2 to 3 days. After that, ice and heat may be alternated to reduce pain and spasms.  Ask your caregiver about trying back exercises and gentle massage. This may be of some benefit.  Avoid feeling anxious or stressed.Stress increases muscle tension and can worsen back pain.It is important to recognize when you are anxious or stressed and learn ways to manage it.Exercise is a great option. SEEK MEDICAL CARE IF:  You have pain that is not relieved with rest or medicine.  You have pain that does not improve in 1 week.  You have new symptoms.  You are generally not feeling well. SEEK  IMMEDIATE MEDICAL CARE IF:   You have pain that radiates from your back into your legs.  You develop new bowel or bladder control problems.  You have unusual weakness or numbness in your arms or legs.  You develop nausea or vomiting.  You develop abdominal pain.  You feel faint. Document Released: 08/30/2005 Document Revised: 02/29/2012 Document Reviewed: 01/18/2011 [facility] Patient Information 2014 [place], [place].     Do not drive within 4 hours of taking hydrocodone as this will make you drowsy.  Avoid lifting,  Bending,   Twisting or any other activity that worsens your pain over the next week.  Apply an  icepack  to your lower back for 10-15 minutes every 2 hours for the next 2 days.  You should get rechecked if your symptoms are not better over the next 5 days,  Or you develop increased pain,  Weakness in your leg(s) or loss of bladder or bowel function - these are symptoms of a worse injury.

## 2014-01-10 NOTE — ED Provider Notes (Signed)
Medical screening examination/treatment/procedure(s) were performed by non-physician practitioner and as supervising physician I was immediately available for consultation/collaboration.   EKG Interpretation None      Rolland Porter, MD, Abram Sander   Janice Norrie, MD 01/10/14 [phone]

## 2014-05-28 ENCOUNTER — Emergency Department (HOSPITAL_COMMUNITY)
Admission: EM | Admit: 2014-05-28 | Discharge: 2014-05-28 | Disposition: A | Discharge status: Home / Self Care | Payer: Medicare Other | Attending: Emergency Medicine | Admitting: Emergency Medicine

## 2014-05-28 ENCOUNTER — Encounter (HOSPITAL_COMMUNITY): Payer: Self-pay | Admitting: Emergency Medicine

## 2014-05-28 ENCOUNTER — Emergency Department (HOSPITAL_COMMUNITY): Payer: Medicare Other

## 2014-05-28 DIAGNOSIS — S99929A Unspecified injury of unspecified foot, initial encounter: Secondary | ICD-10-CM

## 2014-05-28 DIAGNOSIS — S8990XA Unspecified injury of unspecified lower leg, initial encounter: Secondary | ICD-10-CM | POA: Diagnosis present

## 2014-05-28 DIAGNOSIS — K219 Gastro-esophageal reflux disease without esophagitis: Secondary | ICD-10-CM | POA: Insufficient documentation

## 2014-05-28 DIAGNOSIS — F411 Generalized anxiety disorder: Secondary | ICD-10-CM | POA: Insufficient documentation

## 2014-05-28 DIAGNOSIS — Z791 Long term (current) use of non-steroidal anti-inflammatories (NSAID): Secondary | ICD-10-CM | POA: Diagnosis not present

## 2014-05-28 DIAGNOSIS — Z862 Personal history of diseases of the blood and blood-forming organs and certain disorders involving the immune mechanism: Secondary | ICD-10-CM | POA: Diagnosis not present

## 2014-05-28 DIAGNOSIS — J438 Other emphysema: Secondary | ICD-10-CM | POA: Diagnosis not present

## 2014-05-28 DIAGNOSIS — Z8639 Personal history of other endocrine, nutritional and metabolic disease: Secondary | ICD-10-CM | POA: Insufficient documentation

## 2014-05-28 DIAGNOSIS — W2203XA Walked into furniture, initial encounter: Secondary | ICD-10-CM | POA: Insufficient documentation

## 2014-05-28 DIAGNOSIS — Z87442 Personal history of urinary calculi: Secondary | ICD-10-CM | POA: Insufficient documentation

## 2014-05-28 DIAGNOSIS — Z79899 Other long term (current) drug therapy: Secondary | ICD-10-CM | POA: Insufficient documentation

## 2014-05-28 DIAGNOSIS — G43909 Migraine, unspecified, not intractable, without status migrainosus: Secondary | ICD-10-CM | POA: Insufficient documentation

## 2014-05-28 DIAGNOSIS — Z8541 Personal history of malignant neoplasm of cervix uteri: Secondary | ICD-10-CM | POA: Diagnosis not present

## 2014-05-28 DIAGNOSIS — S99919A Unspecified injury of unspecified ankle, initial encounter: Secondary | ICD-10-CM | POA: Diagnosis present

## 2014-05-28 DIAGNOSIS — Y929 Unspecified place or not applicable: Secondary | ICD-10-CM | POA: Insufficient documentation

## 2014-05-28 DIAGNOSIS — Y9389 Activity, other specified: Secondary | ICD-10-CM | POA: Diagnosis not present

## 2014-05-28 DIAGNOSIS — I10 Essential (primary) hypertension: Secondary | ICD-10-CM | POA: Diagnosis not present

## 2014-05-28 DIAGNOSIS — S92919A Unspecified fracture of unspecified toe(s), initial encounter for closed fracture: Secondary | ICD-10-CM | POA: Diagnosis not present

## 2014-05-28 DIAGNOSIS — F329 Major depressive disorder, single episode, unspecified: Secondary | ICD-10-CM | POA: Diagnosis not present

## 2014-05-28 DIAGNOSIS — F17200 Nicotine dependence, unspecified, uncomplicated: Secondary | ICD-10-CM | POA: Diagnosis not present

## 2014-05-28 DIAGNOSIS — Z8673 Personal history of transient ischemic attack (TIA), and cerebral infarction without residual deficits: Secondary | ICD-10-CM | POA: Insufficient documentation

## 2014-05-28 DIAGNOSIS — F3289 Other specified depressive episodes: Secondary | ICD-10-CM | POA: Diagnosis not present

## 2014-05-28 DIAGNOSIS — S92502A Displaced unspecified fracture of left lesser toe(s), initial encounter for closed fracture: Secondary | ICD-10-CM

## 2014-05-28 DIAGNOSIS — M19019 Primary osteoarthritis, unspecified shoulder: Secondary | ICD-10-CM | POA: Diagnosis not present

## 2014-05-28 MED ORDER — OXYCODONE-ACETAMINOPHEN 5-325 MG PO TABS: 1.0000 | ORAL_TABLET | Freq: Four times a day (QID) | ORAL | Status: DC | PRN

## 2014-05-28 MED ORDER — IBUPROFEN 600 MG PO TABS: 600.0000 mg | ORAL_TABLET | Freq: Four times a day (QID) | ORAL | Status: DC | PRN

## 2014-05-28 MED ORDER — OXYCODONE-ACETAMINOPHEN 5-325 MG PO TABS
1.0000 | ORAL_TABLET | Freq: Once | ORAL | Status: AC
  Administered 2014-05-28: 1 via ORAL
  Filled 2014-05-28: qty 1

## 2014-05-28 MED ORDER — IBUPROFEN 800 MG PO TABS
800.0000 mg | ORAL_TABLET | Freq: Once | ORAL | Status: AC
  Administered 2014-05-28: 800 mg via ORAL
  Filled 2014-05-28: qty 1

## 2014-05-28 MED ORDER — ONDANSETRON HCL 4 MG PO TABS
4.0000 mg | ORAL_TABLET | Freq: Once | ORAL | Status: AC
  Administered 2014-05-28: 4 mg via ORAL
  Filled 2014-05-28: qty 1

## 2014-05-28 NOTE — ED Notes (Signed)
Left foot 4th toe ,  Hit this morning on coffee table.

## 2014-05-28 NOTE — ED Provider Notes (Signed)
CSN: [id number]     Arrival date & time 05/28/14  1527 History   First MD Initiated Contact with Patient 05/28/14 1529     Chief Complaint  Patient presents with  . Toe Injury     (Consider location/radiation/quality/duration/timing/severity/associated sxs/prior Treatment) HPI Comments: Patient is a 48 year old female who presents to the emergency department with complaint of left foot, and left fourth toe pain. The patient states that early this morning about 7 AM she accidentally hit her foot on a wooden coffee table. The patient states that there was deformity of her fourth toe, but her husband straighten it out for her. The patient states that the more she has been walking and getting around today the more of the foot and toe have been throbbing. She presents now for evaluation of this problem. The patient denies any anticoagulation medications. She's not had any previous operations or procedures involving the left foot.  The history is provided by the patient.    Past Medical History  Diagnosis Date  . Cancer     cervical  . PONV (postoperative nausea and vomiting)   . Hypertension     takes Metoprolol and Lisinopril occ  . Hyperlipidemia     doesn't take any meds at present time  . Emphysema   . Shortness of breath     with exertion  . Bronchitis     hx of-couple of years ago  . Headache(784.0)   . Migraines     last on e3/10/13  . Dizziness     hx of  . Confusion   . Stroke     TIA-22 yrs ago  . Arthritis     shoulders  . Joint pain   . Joint swelling   . Neck pain     stenosis and radiculopathy  . Bruises easily     platelets are always high  . GERD (gastroesophageal reflux disease)     takes Omeprazole and Ranitidine daily  . Gastric ulcer   . Diarrhea   . History of kidney stones 2000  . Blood transfusion 2004  . Anxiety     takes Xanax daily  . Depression     takes Zoloft nightly  . Insomnia     xanax and pain pills help   Past Surgical History    Procedure Laterality Date  . Ectopic pregnancy surgery  1987/1989  . Cervical cone biopsy  20+yrs ago    d/t cervical cancer  . Abdominal hysterectomy  2004    partial d/t fibroid tumors  . Breast surgery  2002    breast reduction  . Right hand  90's    cyst removed from top of hand  . Anterior cervical decomp/discectomy fusion  11/24/2011    Procedure: ANTERIOR CERVICAL DECOMPRESSION/DISCECTOMY FUSION 3 LEVELS;  Surgeon: Hosie Spangle, MD;  Location: [facility];  Service: Neurosurgery;  Laterality: N/A;  Cervical three-four, four-five,five-six anterior cervical decompression with fusion plating and bonegraft   Family History  Problem Relation Age of Onset  . Anesthesia problems Neg Hx   . Hypotension Neg Hx   . Malignant hyperthermia Neg Hx   . Pseudochol deficiency Neg Hx    History  Substance Use Topics  . Smoking status: Current Every Day Smoker -- 0.50 packs/day for 32 years    Types: Cigarettes  . Smokeless tobacco: Not on file  . Alcohol Use: 0.6 oz/week    1 Cans of beer per week     Comment: 1 beer  daily   OB History   Grav Para Term Preterm Abortions TAB SAB Ect Mult Living                 Review of Systems  Constitutional: Negative for activity change.       All ROS Neg except as noted in HPI  Eyes: Negative for photophobia and discharge.  Respiratory: Positive for cough and shortness of breath. Negative for wheezing.   Cardiovascular: Negative for chest pain and palpitations.  Gastrointestinal: Negative for abdominal pain and blood in stool.  Genitourinary: Negative for dysuria, frequency and hematuria.  Musculoskeletal: Positive for arthralgias. Negative for back pain and neck pain.  Skin: Negative.   Neurological: Positive for headaches. Negative for dizziness, seizures and speech difficulty.  Psychiatric/Behavioral: The patient is nervous/anxious.       Allergies  Codeine  Home Medications   Prior to Admission medications   Medication Sig  Start Date End Date Taking? Authorizing Provider  albuterol (PROVENTIL HFA;VENTOLIN HFA) 108 (90 BASE) MCG/ACT inhaler Inhale 2 puffs into the lungs every 6 (six) hours as needed. For shortness of breath    Historical Provider, MD  ALPRAZolam Duanne Moron) 0.5 MG tablet Take 0.5 mg by mouth 4 (four) times daily as needed. Stress    Historical Provider, MD  cyclobenzaprine (FLEXERIL) 10 MG tablet Take 10 mg by mouth 3 (three) times daily as needed. For back spasms    Historical Provider, MD  HYDROcodone-acetaminophen (NORCO/VICODIN) 5-325 MG per tablet Take 1 tablet by mouth every 4 (four) hours as needed. 01/09/14   Evalee Jefferson, PA-C  ibuprofen (ADVIL,MOTRIN) 600 MG tablet Take 1 tablet (600 mg total) by mouth every 6 (six) hours as needed. 01/09/14   Evalee Jefferson, PA-C  lisinopril-hydrochlorothiazide (PRINZIDE,ZESTORETIC) 20-12.5 MG per tablet Take 1 tablet by mouth daily.    Historical Provider, MD  metoprolol tartrate (LOPRESSOR) 25 MG tablet Take 25 mg by mouth 2 (two) times daily.    Historical Provider, MD  Multiple Vitamin (MULITIVITAMIN WITH MINERALS) TABS Take 1 tablet by mouth daily.    Historical Provider, MD  naproxen (NAPROSYN) 500 MG tablet Take 500 mg by mouth 2 (two) times daily with a meal.    Historical Provider, MD  nitroGLYCERIN (NITROSTAT) 0.4 MG SL tablet Place 0.4 mg under the tongue every 5 (five) minutes as needed. For chest pain    Historical Provider, MD  omeprazole (PRILOSEC) 20 MG capsule Take 20-40 mg by mouth 2 (two) times daily as needed. Acid refux    Historical Provider, MD  ranitidine (ZANTAC) 150 MG tablet Take 150 mg by mouth daily.    Historical Provider, MD  sertraline (ZOLOFT) 50 MG tablet Take 50 mg by mouth daily as needed. depression    Historical Provider, MD   BP 165/104  Pulse 92  Temp(Src) 99.3 F (37.4 C) (Oral)  Resp 20  Ht 5' (1.524 m)  Wt 105 lb (47.628 kg)  BMI 20.51 kg/m2  SpO2 100% Physical Exam  Nursing note and vitals reviewed. Constitutional:  She is oriented to person, place, and time. She appears well-developed and well-nourished.  Non-toxic appearance.  HENT:  Head: Normocephalic.  Right Ear: Tympanic membrane and external ear normal.  Left Ear: Tympanic membrane and external ear normal.  Eyes: EOM and lids are normal. Pupils are equal, round, and reactive to light.  Neck: Normal range of motion. Neck supple. Carotid bruit is not present.  Cardiovascular: Normal rate, regular rhythm, normal heart sounds, intact distal pulses and normal  pulses.   Pulmonary/Chest: Breath sounds normal. No respiratory distress.  Abdominal: Soft. Bowel sounds are normal. There is no tenderness. There is no guarding.  Musculoskeletal:       Left foot: She exhibits decreased range of motion, tenderness and swelling. She exhibits no deformity.       Feet:  Lymphadenopathy:       Head (right side): No submandibular adenopathy present.       Head (left side): No submandibular adenopathy present.    She has no cervical adenopathy.  Neurological: She is alert and oriented to person, place, and time. She has normal strength. No cranial nerve deficit or sensory deficit.  Skin: Skin is warm and dry.  Psychiatric: She has a normal mood and affect. Her speech is normal.    ED Course  Procedures   FRACTURE CARE: Patient identified by arm band. Permission for the procedure given by the patient. X-rays of the fracture of the left fourth toe discussed with the patient. Patient made aware of her fracture. The plan and procedure were then discussed with the patient in terms which he understands. The patient's left third and fourth toes were buddy taped using web-gauzel, gauze roll, and paper tape. The patient was then fitted with a postoperative shoe and crutches. After the procedure, the capillary refill of the toe remains less than 2 seconds. There no temperature or color changes. Patient tolerated the procedure without problem. Labs Review Labs Reviewed - No  data to display  Imaging Review No results found.   EKG Interpretation None      MDM  Blood pressure elevated at 165/104. Patient has a history of hypertension. She is unsure she took her medications this morning. The remainder of the vital signs are within normal limits. Pulse oximetry is 100% on room air. Within normal limits by my interpretation.  X-ray reveals a fracture of the left fourth toe. The patient received a buddy tape splint, and was placed in a postoperative shoe. Prescription for Percocet and ibuprofen given to the patient. Patient referred to Dr. Paulla Dolly, or member of his team for followup of the toe fracture.    Final diagnoses:  None    **I have reviewed nursing notes, vital signs, and all appropriate lab and imaging results for this patient.Lenox Ahr, PA-C 05/28/14 1729

## 2014-05-28 NOTE — Discharge Instructions (Signed)
You have a fracture of the left fourth toe.  Buddy Taping of Toes We have taped your toes together to keep them from moving. This is called "buddy taping" since we used a part of your own body to keep the injured part still. We placed soft padding between your toes to keep them from rubbing against each other. Buddy taping will help with healing and to reduce pain. Keep your toes buddy taped together for as long as directed by your caregiver. HOME CARE INSTRUCTIONS   Raise your injured area above the level of your heart while sitting or lying down. Prop it up with pillows.  An ice pack used every twenty minutes, while awake, for the first one to two days may be helpful. Put ice in a plastic bag and put a towel between the bag and your skin.  Watch for signs that the taping is too tight. These signs may be:  Numbness of your taped toes.  Coolness of your taped toes.  Color change in the area beyond the tape.  Increased pain.  If you have any of these signs, loosen or rewrap the tape. If you need to loosen or rewrap the buddy tape, make sure you use the padding again. SEEK IMMEDIATE MEDICAL CARE IF:   You have worse pain, swelling, inflammation (soreness), drainage or bleeding after you rewrap the tape.  Any new problems occur. MAKE SURE YOU:   Understand these instructions.  Will watch your condition.  Will get help right away if you are not doing well or get worse. Document Released: 06/03/2004 Document Revised: 11/22/2011 Document Reviewed: 08/27/2008 [facility] Patient Information 2015 [place], [place]. This information is not intended to replace advice given to you by your health care provider. Make sure you discuss any questions you have with your health care provider.  Toe Fracture A toe fracture is a break in the bone of a toe. It may take 6 to 8 weeks for the toe injury to heal. HOME CARE  "Buddy taping" is taping the broken toe to the toe next to it. Leave the toes taped  together for at least 1 week or as told by your doctor. Change the tape after bathing. Always use a small piece of gauze or cotton between the toes when taping them together.  Put ice on the injured area.  Put ice in a plastic bag.  Place a towel between your skin and the bag.  Leave the ice on for 15-20 minutes, 03-04 times a day.  After the first 2 days, put heat on the injured area. Use heat for the next 2 to 3 days. Put a heating pad on the foot or soak the foot in warm water as told by your doctor.  Keep the foot raised (elevated) above the level of your heart.  Wear sturdy, supportive shoes. The shoes should not pinch the toes or fit tightly against the toes.  Use a cast shoe (if prescribed) if the foot is very puffy (swollen).  Use crutches if you have pain or it hurts too much to walk.  Only take medicine as told by your doctor.  Follow up with your doctor as told. GET HELP RIGHT AWAY IF:   There is pain or puffiness that is not helped by medicine.  The pain does not get better after 1 week.  The toe is cold when the others are warm.  The toe loses feeling (numb) or turns white.  The toe becomes hot and red (inflamed).  MAKE SURE YOU:   Understand these instructions.  Will watch this condition.  Will get help right away if you are not doing well or get worse. Document Released: 02/16/2008 Document Revised: 11/22/2011 Document Reviewed: 01/23/2010 [facility] Patient Information 2015 [place], [place]. This information is not intended to replace advice given to you by your health care provider. Make sure you discuss any questions you have with your health care provider. Please keep your foot elevated above your waist is much as possible. Please use the postoperative shoe and crutches until seen by the podiatry specialist. Please see Dr. Paulla Dolly or member of his team for additional management of your fracture.

## 2014-05-30 NOTE — ED Provider Notes (Signed)
Medical screening examination/treatment/procedure(s) were performed by non-physician practitioner and as supervising physician I was immediately available for consultation/collaboration.   EKG Interpretation None        Fredia Sorrow, MD 05/30/14 [phone]

## 2015-07-28 ENCOUNTER — Other Ambulatory Visit (HOSPITAL_COMMUNITY): Payer: Self-pay | Admitting: Family Medicine

## 2015-07-28 DIAGNOSIS — Z1231 Encounter for screening mammogram for malignant neoplasm of breast: Secondary | ICD-10-CM

## 2015-07-30 ENCOUNTER — Encounter: Payer: Self-pay | Admitting: Internal Medicine

## 2015-08-06 ENCOUNTER — Ambulatory Visit (HOSPITAL_COMMUNITY)
Admission: RE | Admit: 2015-08-06 | Discharge: 2015-08-06 | Disposition: A | Discharge status: Home / Self Care | Payer: Medicare Other | Source: Ambulatory Visit | Attending: Family Medicine | Admitting: Family Medicine

## 2015-08-06 DIAGNOSIS — Z1231 Encounter for screening mammogram for malignant neoplasm of breast: Secondary | ICD-10-CM | POA: Diagnosis present

## 2015-08-21 ENCOUNTER — Ambulatory Visit: Payer: Medicare Other | Admitting: Gastroenterology

## 2015-09-10 ENCOUNTER — Encounter: Payer: Self-pay | Admitting: Gastroenterology

## 2015-09-10 ENCOUNTER — Ambulatory Visit: Payer: Medicare Other | Admitting: Gastroenterology

## 2015-09-10 ENCOUNTER — Telehealth: Payer: Self-pay | Admitting: Gastroenterology

## 2015-09-10 NOTE — Telephone Encounter (Signed)
PATIENT WAS A NO SHOW AND LETTER SENT  °

## 2016-04-29 ENCOUNTER — Emergency Department (HOSPITAL_COMMUNITY): Payer: Medicare Other

## 2016-04-29 ENCOUNTER — Emergency Department (HOSPITAL_COMMUNITY)
Admission: EM | Admit: 2016-04-29 | Discharge: 2016-04-29 | Disposition: A | Discharge status: Home / Self Care | Payer: Medicare Other | Attending: Emergency Medicine | Admitting: Emergency Medicine

## 2016-04-29 ENCOUNTER — Encounter (HOSPITAL_COMMUNITY): Payer: Self-pay

## 2016-04-29 DIAGNOSIS — Z791 Long term (current) use of non-steroidal anti-inflammatories (NSAID): Secondary | ICD-10-CM | POA: Insufficient documentation

## 2016-04-29 DIAGNOSIS — Y999 Unspecified external cause status: Secondary | ICD-10-CM | POA: Diagnosis not present

## 2016-04-29 DIAGNOSIS — Z8673 Personal history of transient ischemic attack (TIA), and cerebral infarction without residual deficits: Secondary | ICD-10-CM | POA: Insufficient documentation

## 2016-04-29 DIAGNOSIS — F17210 Nicotine dependence, cigarettes, uncomplicated: Secondary | ICD-10-CM | POA: Diagnosis not present

## 2016-04-29 DIAGNOSIS — W010XXA Fall on same level from slipping, tripping and stumbling without subsequent striking against object, initial encounter: Secondary | ICD-10-CM | POA: Insufficient documentation

## 2016-04-29 DIAGNOSIS — Y9301 Activity, walking, marching and hiking: Secondary | ICD-10-CM | POA: Insufficient documentation

## 2016-04-29 DIAGNOSIS — S79912A Unspecified injury of left hip, initial encounter: Secondary | ICD-10-CM | POA: Diagnosis present

## 2016-04-29 DIAGNOSIS — S7002XA Contusion of left hip, initial encounter: Secondary | ICD-10-CM

## 2016-04-29 DIAGNOSIS — I10 Essential (primary) hypertension: Secondary | ICD-10-CM | POA: Insufficient documentation

## 2016-04-29 DIAGNOSIS — Z79899 Other long term (current) drug therapy: Secondary | ICD-10-CM | POA: Diagnosis not present

## 2016-04-29 DIAGNOSIS — Y92002 Bathroom of unspecified non-institutional (private) residence single-family (private) house as the place of occurrence of the external cause: Secondary | ICD-10-CM | POA: Insufficient documentation

## 2016-04-29 MED ORDER — ONDANSETRON 8 MG PO TBDP
8.0000 mg | ORAL_TABLET | Freq: Once | ORAL | Status: AC
  Administered 2016-04-29: 8 mg via ORAL
  Filled 2016-04-29: qty 1

## 2016-04-29 MED ORDER — HYDROCODONE-ACETAMINOPHEN 5-325 MG PO TABS
1.0000 | ORAL_TABLET | Freq: Once | ORAL | Status: AC
  Administered 2016-04-29: 1 via ORAL
  Filled 2016-04-29: qty 1

## 2016-04-29 MED ORDER — ONDANSETRON HCL 4 MG PO TABS: 4.0000 mg | ORAL_TABLET | Freq: Four times a day (QID) | ORAL | 0 refills | Status: DC

## 2016-04-29 MED ORDER — NAPROXEN 500 MG PO TABS: 500.0000 mg | ORAL_TABLET | Freq: Two times a day (BID) | ORAL | 0 refills | Status: DC

## 2016-04-29 NOTE — Discharge Instructions (Signed)
Follow-up with your primary doctor or with the orthopedic doctor listed.

## 2016-04-29 NOTE — ED Provider Notes (Signed)
[facility] DEPT Provider Note   CSN: [id number] Arrival date & time: 04/29/16  1654     History   Chief Complaint Chief Complaint  Patient presents with  . Fall    HPI Courtney Thornton is a 50 y.o. female.  HPI  Courtney Thornton is a 50 y.o. female who presents to the Emergency Department complaining of left hip pain after a fall.  She states that she was walking to the bathroom and her "left leg gave out" which caused her to fall.  She has pain to the left hip that is worse with weight bearing.  She has not tried any medications or therapies.  She denies sx's prior to fall, head injury, LOC, neck pain, numbness or the extremity, and abdominal pain.   Past Medical History:  Diagnosis Date  . Anxiety    takes Xanax daily  . Arthritis    shoulders  . Blood transfusion 2004  . Bronchitis    hx of-couple of years ago  . Bruises easily    platelets are always high  . Cancer (HCC)    cervical  . Confusion   . Depression    takes Zoloft nightly  . Diarrhea   . Dizziness    hx of  . Emphysema   . Gastric ulcer   . GERD (gastroesophageal reflux disease)    takes Omeprazole and Ranitidine daily  . Headache(784.0)   . History of kidney stones 2000  . Hyperlipidemia    doesn't take any meds at present time  . Hypertension    takes Metoprolol and Lisinopril occ  . Insomnia    xanax and pain pills help  . Joint pain   . Joint swelling   . Migraines    last on e3/10/13  . Neck pain    stenosis and radiculopathy  . PONV (postoperative nausea and vomiting)   . Shortness of breath    with exertion  . Stroke [facility])    TIA-22 yrs ago    There are no active problems to display for this patient.   Past Surgical History:  Procedure Laterality Date  . ABDOMINAL HYSTERECTOMY  2004   partial d/t fibroid tumors  . ANTERIOR CERVICAL DECOMP/DISCECTOMY FUSION  11/24/2011   Procedure: ANTERIOR CERVICAL DECOMPRESSION/DISCECTOMY FUSION 3 LEVELS;  Surgeon: Hosie Spangle, MD;  Location: [place] [facility];  Service: Neurosurgery;  Laterality: N/A;  Cervical three-four, four-five,five-six anterior cervical decompression with fusion plating and bonegraft  . BREAST SURGERY  2002   breast reduction  . CERVICAL CONE BIOPSY  20+yrs ago   d/t cervical cancer  . ECTOPIC PREGNANCY SURGERY  1987/1989  . right hand  90's   cyst removed from top of hand    OB History    No data available       Home Medications    Prior to Admission medications   Medication Sig Start Date End Date Taking? Authorizing Provider  albuterol (PROVENTIL HFA;VENTOLIN HFA) 108 (90 BASE) MCG/ACT inhaler Inhale 2 puffs into the lungs every 6 (six) hours as needed. For shortness of breath    Historical Provider, MD  ibuprofen (ADVIL,MOTRIN) 600 MG tablet Take 1 tablet (600 mg total) by mouth every 6 (six) hours as needed. 05/28/14   Lily Kocher, PA-C  lisinopril-hydrochlorothiazide (PRINZIDE,ZESTORETIC) 20-12.5 MG per tablet Take 1 tablet by mouth daily.    Historical Provider, MD  metoprolol tartrate (LOPRESSOR) 25 MG tablet Take 25 mg by mouth 2 (two) times daily.  Historical Provider, MD  Multiple Vitamin (MULITIVITAMIN WITH MINERALS) TABS Take 1 tablet by mouth daily.    Historical Provider, MD  nitroGLYCERIN (NITROSTAT) 0.4 MG SL tablet Place 0.4 mg under the tongue every 5 (five) minutes as needed. For chest pain    Historical Provider, MD  omeprazole (PRILOSEC) 20 MG capsule Take 20-40 mg by mouth 2 (two) times daily as needed. Acid refux    Historical Provider, MD  oxyCODONE-acetaminophen (PERCOCET/ROXICET) 5-325 MG per tablet Take 1 tablet by mouth every 6 (six) hours as needed. 05/28/14   Lily Kocher, PA-C  ranitidine (ZANTAC) 150 MG tablet Take 150 mg by mouth daily.    Historical Provider, MD  sertraline (ZOLOFT) 50 MG tablet Take 50 mg by mouth daily as needed. depression    Historical Provider, MD    Family History Family History  Problem Relation Age of Onset  .  Anesthesia problems Neg Hx   . Hypotension Neg Hx   . Malignant hyperthermia Neg Hx   . Pseudochol deficiency Neg Hx     Social History Social History  Substance Use Topics  . Smoking status: Current Every Day Smoker    Packs/day: 1.00    Years: 32.00    Types: Cigarettes  . Smokeless tobacco: Never Used  . Alcohol use 0.6 oz/week    1 Cans of beer per week     Comment: (3) 40oz a day     Allergies   Review of patient's allergies indicates no known allergies.   Review of Systems Review of Systems  Constitutional: Negative for chills and fever.  Gastrointestinal: Negative for abdominal pain, nausea and vomiting.  Genitourinary: Negative for difficulty urinating, dysuria and hematuria.  Musculoskeletal: Positive for arthralgias (left hip pain). Negative for joint swelling.  Skin: Negative for color change and wound.  Neurological: Positive for weakness. Negative for dizziness, numbness and headaches.  All other systems reviewed and are negative.    Physical Exam Updated Vital Signs BP 101/64 (BP Location: Left Arm)   Pulse 101   Temp 99 F (37.2 C) (Oral)   Resp 18   Ht 4\' 9"  (1.448 m)   Wt 36.8 kg   SpO2 100%   BMI 17.57 kg/m   Physical Exam  Constitutional: She is oriented to person, place, and time. She appears well-developed and well-nourished. No distress.  HENT:  Head: Normocephalic and atraumatic.  Neck: Normal range of motion. Neck supple.  Cardiovascular: Normal rate, regular rhythm, normal heart sounds and intact distal pulses.   No murmur heard. Pulmonary/Chest: Effort normal and breath sounds normal. No respiratory distress.  Abdominal: Soft. She exhibits no distension. There is no tenderness.  Musculoskeletal: She exhibits tenderness. She exhibits no edema.       Lumbar back: She exhibits tenderness and pain. She exhibits normal range of motion, no swelling, no deformity, no laceration and normal pulse.  ttp of the left lumbar paraspinal muscles  and the left laterl hip.  No spinal tenderness.  DP pulses are brisk and symmetrical.  Distal sensation intact.  Pt has 5/5 strength against resistance of bilateral lower extremities.  No shortening or external rotation   Neurological: She is alert and oriented to person, place, and time. She has normal strength. No sensory deficit. She exhibits normal muscle tone. Coordination and gait normal.  Reflex Scores:      Patellar reflexes are 2+ on the right side and 2+ on the left side.      Achilles reflexes are 2+ on  the right side and 2+ on the left side. Skin: Skin is warm and dry. No rash noted.  Psychiatric: She has a normal mood and affect.  Nursing note and vitals reviewed.    ED Treatments / Results  Labs (all labs ordered are listed, but only abnormal results are displayed) Labs Reviewed - No data to display  EKG  EKG Interpretation None       Radiology Dg Hip Unilat With Pelvis 2-3 Views Left  Result Date: 04/29/2016 CLINICAL DATA:  Left leg gave out while walking. Left hip pain. Fall. EXAM: DG HIP (WITH OR WITHOUT PELVIS) 2-3V LEFT COMPARISON:  None. FINDINGS: There is no evidence of hip fracture or dislocation. There is no evidence of arthropathy or other focal bone abnormality. IMPRESSION: Negative left hip radiographs Electronically Signed   By: San Morelle M.D.   On: 04/29/2016 17:24    Procedures Procedures (including critical care time)  Medications Ordered in ED Medications - No data to display   Initial Impression / Assessment and Plan / ED Course  I have reviewed the triage vital signs and the nursing notes.  Pertinent labs & imaging results that were available during my care of the patient were reviewed by me and considered in my medical decision making (see chart for details).  Clinical Course   Pain improved after medications.  Remains NV intact.  Pt ambulated in the exam room and restroom.  XR neg.  Likely strain.  Pt agrees to close orthopedic  f/u.     Final Clinical Impressions(s) / ED Diagnoses   Final diagnoses:  Contusion of left hip, initial encounter    New Prescriptions New Prescriptions   No medications on file     Kem Parkinson, Hershal Coria 04/30/16 [place], MD 05/02/16 1158

## 2016-04-29 NOTE — ED Triage Notes (Signed)
States she was walking to bathroom and left leg gave out on her. Reports of left hip pain.

## 2016-07-31 ENCOUNTER — Inpatient Hospital Stay (HOSPITAL_COMMUNITY)
Admission: EM | Admit: 2016-07-31 | Discharge: 2016-08-05 | DRG: 640 | Disposition: A | Discharge status: Skilled Nursing Facility | Payer: Medicare Other | Attending: Internal Medicine | Admitting: Internal Medicine

## 2016-07-31 ENCOUNTER — Encounter (HOSPITAL_COMMUNITY): Payer: Self-pay | Admitting: Emergency Medicine

## 2016-07-31 ENCOUNTER — Emergency Department (HOSPITAL_COMMUNITY): Payer: Medicare Other

## 2016-07-31 DIAGNOSIS — R296 Repeated falls: Secondary | ICD-10-CM | POA: Diagnosis present

## 2016-07-31 DIAGNOSIS — Z681 Body mass index (BMI) 19 or less, adult: Secondary | ICD-10-CM

## 2016-07-31 DIAGNOSIS — W19XXXA Unspecified fall, initial encounter: Secondary | ICD-10-CM | POA: Diagnosis not present

## 2016-07-31 DIAGNOSIS — F17210 Nicotine dependence, cigarettes, uncomplicated: Secondary | ICD-10-CM | POA: Diagnosis present

## 2016-07-31 DIAGNOSIS — E785 Hyperlipidemia, unspecified: Secondary | ICD-10-CM | POA: Diagnosis present

## 2016-07-31 DIAGNOSIS — S82402A Unspecified fracture of shaft of left fibula, initial encounter for closed fracture: Secondary | ICD-10-CM | POA: Diagnosis present

## 2016-07-31 DIAGNOSIS — S79912A Unspecified injury of left hip, initial encounter: Secondary | ICD-10-CM | POA: Diagnosis present

## 2016-07-31 DIAGNOSIS — E871 Hypo-osmolality and hyponatremia: Secondary | ICD-10-CM | POA: Diagnosis present

## 2016-07-31 DIAGNOSIS — E876 Hypokalemia: Secondary | ICD-10-CM | POA: Diagnosis present

## 2016-07-31 DIAGNOSIS — F1010 Alcohol abuse, uncomplicated: Secondary | ICD-10-CM | POA: Diagnosis present

## 2016-07-31 DIAGNOSIS — G4700 Insomnia, unspecified: Secondary | ICD-10-CM | POA: Diagnosis present

## 2016-07-31 DIAGNOSIS — Z859 Personal history of malignant neoplasm, unspecified: Secondary | ICD-10-CM | POA: Diagnosis not present

## 2016-07-31 DIAGNOSIS — R197 Diarrhea, unspecified: Secondary | ICD-10-CM | POA: Diagnosis present

## 2016-07-31 DIAGNOSIS — Z23 Encounter for immunization: Secondary | ICD-10-CM

## 2016-07-31 DIAGNOSIS — T502X5A Adverse effect of carbonic-anhydrase inhibitors, benzothiadiazides and other diuretics, initial encounter: Secondary | ICD-10-CM | POA: Diagnosis present

## 2016-07-31 DIAGNOSIS — I509 Heart failure, unspecified: Secondary | ICD-10-CM | POA: Diagnosis not present

## 2016-07-31 DIAGNOSIS — E869 Volume depletion, unspecified: Secondary | ICD-10-CM | POA: Diagnosis present

## 2016-07-31 DIAGNOSIS — Z8673 Personal history of transient ischemic attack (TIA), and cerebral infarction without residual deficits: Secondary | ICD-10-CM | POA: Diagnosis not present

## 2016-07-31 DIAGNOSIS — D649 Anemia, unspecified: Secondary | ICD-10-CM | POA: Diagnosis not present

## 2016-07-31 DIAGNOSIS — K219 Gastro-esophageal reflux disease without esophagitis: Secondary | ICD-10-CM | POA: Diagnosis present

## 2016-07-31 DIAGNOSIS — E43 Unspecified severe protein-calorie malnutrition: Secondary | ICD-10-CM | POA: Diagnosis present

## 2016-07-31 DIAGNOSIS — Y939 Activity, unspecified: Secondary | ICD-10-CM | POA: Diagnosis not present

## 2016-07-31 DIAGNOSIS — Y929 Unspecified place or not applicable: Secondary | ICD-10-CM | POA: Diagnosis not present

## 2016-07-31 DIAGNOSIS — Z90710 Acquired absence of both cervix and uterus: Secondary | ICD-10-CM | POA: Diagnosis not present

## 2016-07-31 DIAGNOSIS — Y999 Unspecified external cause status: Secondary | ICD-10-CM | POA: Diagnosis not present

## 2016-07-31 DIAGNOSIS — R609 Edema, unspecified: Secondary | ICD-10-CM

## 2016-07-31 DIAGNOSIS — M25572 Pain in left ankle and joints of left foot: Secondary | ICD-10-CM | POA: Diagnosis not present

## 2016-07-31 DIAGNOSIS — Z5321 Procedure and treatment not carried out due to patient leaving prior to being seen by health care provider: Secondary | ICD-10-CM | POA: Diagnosis not present

## 2016-07-31 DIAGNOSIS — R2689 Other abnormalities of gait and mobility: Secondary | ICD-10-CM

## 2016-07-31 DIAGNOSIS — M25552 Pain in left hip: Secondary | ICD-10-CM | POA: Diagnosis not present

## 2016-07-31 DIAGNOSIS — M6281 Muscle weakness (generalized): Secondary | ICD-10-CM

## 2016-07-31 DIAGNOSIS — Z87442 Personal history of urinary calculi: Secondary | ICD-10-CM

## 2016-07-31 DIAGNOSIS — D638 Anemia in other chronic diseases classified elsewhere: Secondary | ICD-10-CM | POA: Diagnosis present

## 2016-07-31 DIAGNOSIS — I10 Essential (primary) hypertension: Secondary | ICD-10-CM | POA: Diagnosis present

## 2016-07-31 LAB — HEPATIC FUNCTION PANEL
ALT: 55 U/L — AB (ref 14–54)
AST: 99 U/L — AB (ref 15–41)
Albumin: 2.7 g/dL — ABNORMAL LOW (ref 3.5–5.0)
Alkaline Phosphatase: 256 U/L — ABNORMAL HIGH (ref 38–126)
BILIRUBIN DIRECT: 1.3 mg/dL — AB (ref 0.1–0.5)
Indirect Bilirubin: 1.5 mg/dL — ABNORMAL HIGH (ref 0.3–0.9)
TOTAL PROTEIN: 6.6 g/dL (ref 6.5–8.1)
Total Bilirubin: 2.8 mg/dL — ABNORMAL HIGH (ref 0.3–1.2)

## 2016-07-31 LAB — BASIC METABOLIC PANEL
Anion gap: 16 — ABNORMAL HIGH (ref 5–15)
CO2: 24 mmol/L (ref 22–32)
CREATININE: 0.46 mg/dL (ref 0.44–1.00)
Calcium: 7.8 mg/dL — ABNORMAL LOW (ref 8.9–10.3)
Chloride: 90 mmol/L — ABNORMAL LOW (ref 101–111)
Glucose, Bld: 106 mg/dL — ABNORMAL HIGH (ref 65–99)
POTASSIUM: 1.9 mmol/L — AB (ref 3.5–5.1)
SODIUM: 130 mmol/L — AB (ref 135–145)

## 2016-07-31 LAB — PROTIME-INR
INR: 1.04
PROTHROMBIN TIME: 13.6 s (ref 11.4–15.2)

## 2016-07-31 LAB — RETICULOCYTES
RBC.: 2.76 MIL/uL — ABNORMAL LOW (ref 3.87–5.11)
Retic Count, Absolute: 179.4 10*3/uL (ref 19.0–186.0)
Retic Ct Pct: 6.5 % — ABNORMAL HIGH (ref 0.4–3.1)

## 2016-07-31 LAB — TSH: TSH: 2.186 u[IU]/mL (ref 0.350–4.500)

## 2016-07-31 LAB — CBC WITH DIFFERENTIAL/PLATELET
BASOS PCT: 1 %
Basophils Absolute: 0.1 10*3/uL (ref 0.0–0.1)
EOS ABS: 0.3 10*3/uL (ref 0.0–0.7)
EOS PCT: 2 %
HCT: 28.5 % — ABNORMAL LOW (ref 36.0–46.0)
Hemoglobin: 9.7 g/dL — ABNORMAL LOW (ref 12.0–15.0)
LYMPHS ABS: 1.9 10*3/uL (ref 0.7–4.0)
Lymphocytes Relative: 15 %
MCH: 33.1 pg (ref 26.0–34.0)
MCHC: 34 g/dL (ref 30.0–36.0)
MCV: 97.3 fL (ref 78.0–100.0)
Monocytes Absolute: 1 10*3/uL (ref 0.1–1.0)
Monocytes Relative: 7 %
NEUTROS PCT: 75 %
Neutro Abs: 10 10*3/uL — ABNORMAL HIGH (ref 1.7–7.7)
PLATELETS: 286 10*3/uL (ref 150–400)
RBC: 2.93 MIL/uL — AB (ref 3.87–5.11)
RDW: 14.3 % (ref 11.5–15.5)
WBC: 13.2 10*3/uL — AB (ref 4.0–10.5)

## 2016-07-31 LAB — BRAIN NATRIURETIC PEPTIDE: B NATRIURETIC PEPTIDE 5: 375 pg/mL — AB (ref 0.0–100.0)

## 2016-07-31 LAB — ETHANOL: ALCOHOL ETHYL (B): 34 mg/dL — AB (ref <5)

## 2016-07-31 LAB — MAGNESIUM: Magnesium: 1.4 mg/dL — ABNORMAL LOW (ref 1.7–2.4)

## 2016-07-31 LAB — TROPONIN I

## 2016-07-31 LAB — LIPASE, BLOOD: Lipase: 14 U/L (ref 11–51)

## 2016-07-31 MED ORDER — ONDANSETRON HCL 4 MG/2ML IJ SOLN
4.0000 mg | Freq: Once | INTRAMUSCULAR | Status: AC
  Administered 2016-07-31: 4 mg via INTRAVENOUS
  Filled 2016-07-31: qty 2

## 2016-07-31 MED ORDER — POTASSIUM CHLORIDE 20 MEQ PO PACK
40.0000 meq | PACK | ORAL | Status: DC
  Administered 2016-07-31 (×2): 40 meq via ORAL
  Filled 2016-07-31 (×2): qty 2

## 2016-07-31 MED ORDER — ENSURE ENLIVE PO LIQD
237.0000 mL | Freq: Two times a day (BID) | ORAL | Status: DC
  Administered 2016-07-31 – 2016-08-04 (×8): 237 mL via ORAL

## 2016-07-31 MED ORDER — ACETAMINOPHEN 325 MG PO TABS
650.0000 mg | ORAL_TABLET | Freq: Four times a day (QID) | ORAL | Status: DC | PRN
  Administered 2016-08-05: 650 mg via ORAL
  Filled 2016-07-31: qty 2

## 2016-07-31 MED ORDER — HYDROCODONE-ACETAMINOPHEN 5-325 MG PO TABS
1.0000 | ORAL_TABLET | ORAL | Status: DC | PRN
  Administered 2016-07-31: 1 via ORAL
  Administered 2016-08-01 – 2016-08-04 (×8): 2 via ORAL
  Filled 2016-07-31 (×9): qty 2

## 2016-07-31 MED ORDER — LORAZEPAM 2 MG/ML IJ SOLN
0.0000 mg | Freq: Two times a day (BID) | INTRAMUSCULAR | Status: AC
  Administered 2016-08-03 – 2016-08-04 (×2): 1 mg via INTRAVENOUS
  Filled 2016-07-31 (×2): qty 1

## 2016-07-31 MED ORDER — SODIUM CHLORIDE 0.9% FLUSH
3.0000 mL | Freq: Two times a day (BID) | INTRAVENOUS | Status: DC
  Administered 2016-08-01 – 2016-08-04 (×7): 3 mL via INTRAVENOUS

## 2016-07-31 MED ORDER — VITAMIN B-1 100 MG PO TABS
100.0000 mg | ORAL_TABLET | Freq: Every day | ORAL | Status: DC
  Administered 2016-08-01 – 2016-08-04 (×3): 100 mg via ORAL
  Filled 2016-07-31 (×6): qty 1

## 2016-07-31 MED ORDER — ENOXAPARIN SODIUM 40 MG/0.4ML ~~LOC~~ SOLN
40.0000 mg | SUBCUTANEOUS | Status: DC
  Administered 2016-07-31: 40 mg via SUBCUTANEOUS
  Filled 2016-07-31: qty 0.4

## 2016-07-31 MED ORDER — SODIUM CHLORIDE 0.9 % IV SOLN: INTRAVENOUS | Status: AC

## 2016-07-31 MED ORDER — ONDANSETRON HCL 4 MG PO TABS: 4.0000 mg | ORAL_TABLET | Freq: Four times a day (QID) | ORAL | Status: DC | PRN

## 2016-07-31 MED ORDER — ONDANSETRON HCL 4 MG/2ML IJ SOLN
4.0000 mg | Freq: Four times a day (QID) | INTRAMUSCULAR | Status: DC | PRN
  Administered 2016-08-01 – 2016-08-04 (×2): 4 mg via INTRAVENOUS
  Filled 2016-07-31 (×2): qty 2

## 2016-07-31 MED ORDER — ACETAMINOPHEN 650 MG RE SUPP: 650.0000 mg | Freq: Four times a day (QID) | RECTAL | Status: DC | PRN

## 2016-07-31 MED ORDER — PANTOPRAZOLE SODIUM 40 MG PO TBEC
40.0000 mg | DELAYED_RELEASE_TABLET | Freq: Two times a day (BID) | ORAL | Status: DC
  Administered 2016-08-01 – 2016-08-04 (×8): 40 mg via ORAL
  Filled 2016-07-31 (×9): qty 1

## 2016-07-31 MED ORDER — INFLUENZA VAC SPLIT QUAD 0.5 ML IM SUSY
0.5000 mL | PREFILLED_SYRINGE | INTRAMUSCULAR | Status: AC
  Administered 2016-08-01: 0.5 mL via INTRAMUSCULAR
  Filled 2016-07-31: qty 0.5

## 2016-07-31 MED ORDER — SODIUM CHLORIDE 0.9 % IV BOLUS (SEPSIS)
500.0000 mL | Freq: Once | INTRAVENOUS | Status: AC
  Administered 2016-07-31: 500 mL via INTRAVENOUS

## 2016-07-31 MED ORDER — LORAZEPAM 2 MG/ML IJ SOLN
0.0000 mg | Freq: Four times a day (QID) | INTRAMUSCULAR | Status: AC
  Administered 2016-07-31 – 2016-08-01 (×2): 1 mg via INTRAVENOUS
  Filled 2016-07-31 (×2): qty 1

## 2016-07-31 MED ORDER — MAGNESIUM SULFATE 4 GM/100ML IV SOLN
INTRAVENOUS | Status: AC
  Filled 2016-07-31: qty 100

## 2016-07-31 MED ORDER — LORAZEPAM 2 MG/ML IJ SOLN
1.0000 mg | Freq: Four times a day (QID) | INTRAMUSCULAR | Status: AC | PRN
  Filled 2016-07-31: qty 1

## 2016-07-31 MED ORDER — SODIUM CHLORIDE 0.9 % IV SOLN
INTRAVENOUS | Status: DC
  Administered 2016-07-31 (×2): via INTRAVENOUS

## 2016-07-31 MED ORDER — MAGNESIUM SULFATE 4 GM/100ML IV SOLN
4.0000 g | Freq: Once | INTRAVENOUS | Status: AC
  Administered 2016-07-31: 4 g via INTRAVENOUS
  Filled 2016-07-31: qty 100

## 2016-07-31 MED ORDER — LORAZEPAM 1 MG PO TABS
1.0000 mg | ORAL_TABLET | Freq: Four times a day (QID) | ORAL | Status: AC | PRN
  Administered 2016-08-02: 1 mg via ORAL
  Filled 2016-07-31: qty 1

## 2016-07-31 MED ORDER — THIAMINE HCL 100 MG/ML IJ SOLN
100.0000 mg | Freq: Every day | INTRAMUSCULAR | Status: DC
  Administered 2016-07-31 – 2016-08-03 (×2): 100 mg via INTRAVENOUS
  Filled 2016-07-31 (×2): qty 2

## 2016-07-31 MED ORDER — FOLIC ACID 1 MG PO TABS
1.0000 mg | ORAL_TABLET | Freq: Every day | ORAL | Status: DC
  Administered 2016-07-31 – 2016-08-04 (×5): 1 mg via ORAL
  Filled 2016-07-31 (×6): qty 1

## 2016-07-31 MED ORDER — POTASSIUM CHLORIDE IN NACL 40-0.9 MEQ/L-% IV SOLN
INTRAVENOUS | Status: DC
  Administered 2016-07-31 – 2016-08-01 (×2): 100 mL/h via INTRAVENOUS

## 2016-07-31 MED ORDER — POTASSIUM CHLORIDE CRYS ER 20 MEQ PO TBCR
40.0000 meq | EXTENDED_RELEASE_TABLET | ORAL | Status: AC
  Administered 2016-08-01 (×4): 40 meq via ORAL
  Filled 2016-07-31 (×4): qty 2

## 2016-07-31 MED ORDER — PNEUMOCOCCAL VAC POLYVALENT 25 MCG/0.5ML IJ INJ
0.5000 mL | INJECTION | INTRAMUSCULAR | Status: AC
  Administered 2016-08-01: 0.5 mL via INTRAMUSCULAR
  Filled 2016-07-31: qty 0.5

## 2016-07-31 MED ORDER — POTASSIUM CHLORIDE 10 MEQ/100ML IV SOLN
10.0000 meq | INTRAVENOUS | Status: AC
  Administered 2016-07-31 (×2): 10 meq via INTRAVENOUS
  Filled 2016-07-31 (×2): qty 100

## 2016-07-31 MED ORDER — ADULT MULTIVITAMIN W/MINERALS CH
1.0000 | ORAL_TABLET | Freq: Every day | ORAL | Status: DC
  Administered 2016-07-31 – 2016-08-04 (×5): 1 via ORAL
  Filled 2016-07-31 (×7): qty 1

## 2016-07-31 NOTE — ED Notes (Signed)
CRITICAL VALUE ALERT  Critical value received:  K 1.9  Date of notification:  07/31/16  Time of notification:  1224  Critical value read back:Yes.    Nurse who received alert:  B. Olena Heckle, RN  MD notified (1st page):  Zackowski  Time of first page:  1225

## 2016-07-31 NOTE — ED Triage Notes (Addendum)
Pt reports BLE edema,emesis for last several days. Per EMS, pt drinks approximately half a forty a day. Pt alert but intoxicated at time of arrival. nad noted. cbg en roue 120.

## 2016-07-31 NOTE — H&P (Signed)
History and Physical    Courtney Thornton [id number] DOB: 1966-01-19 DOA: 07/31/2016  Referring MD/NP/PA: Fredia Sorrow, MD PCP: [facility]  Outpatient Specialists:   Gastroenterology: Daneil Dolin, MD  Patient coming from: Home  Chief Complaint:  Bilateral leg swelling  HPI: Courtney Thornton is a 50 y.o. female with medical history significant of alcohol abuse, anxiety, depression, GERD, HLD, and HTN, presents to the ED with complaints of bilateral worsening foot pain and swelling which onset about 1-2 weeks ago. Husband reports that she has been falling lately on a regular basis. She reports that her legs feel weak and gave out. She's not had any fever. She does have diarrhea which appears to be a chronic issue. She also reports that for the past year she's had persistent nausea and vomiting and has been unable to keep anything down. She has been losing weight. Husband reports that she used to drink between 2-3, 40 ounce beers daily, but has cut back to one 40 ounce a day. She has not had any dysuria, shortness of breath, chest pain. She has been feeling increasingly weak. By mouth intake has been poor  ED Course: While in the ED, all vital signs appear stable. Sodium 130, potassium 1.9, BUN <5, alkaline phosphatase 256, albumin 2.7, AST 99, ALT 55, T. Bili.  2.8, WBC 13.2, and Hgb 9.7. Her blood alcohol level is noted to be at 34. X-ray of ankle shows Diffuse soft tissue swelling about the ankle and hindfoot with ossicle adjacent to the tip of the fibula likely representative of an age-indeterminate avulsion fracture. She's been referred for further treatment.  Review of Systems: As per HPI otherwise 10 point review of systems negative.    Past Medical History:  Diagnosis Date  . Anxiety    takes Xanax daily  . Arthritis    shoulders  . Blood transfusion 2004  . Bronchitis    hx of-couple of years ago  . Bruises easily    platelets are always  high  . Cancer (HCC)    cervical  . Confusion   . Depression    takes Zoloft nightly  . Diarrhea   . Dizziness    hx of  . Emphysema   . Gastric ulcer   . GERD (gastroesophageal reflux disease)    takes Omeprazole and Ranitidine daily  . Headache(784.0)   . History of kidney stones 2000  . Hyperlipidemia    doesn't take any meds at present time  . Hypertension    takes Metoprolol and Lisinopril occ  . Insomnia    xanax and pain pills help  . Joint pain   . Joint swelling   . Migraines    last on e3/10/13  . Neck pain    stenosis and radiculopathy  . PONV (postoperative nausea and vomiting)   . Shortness of breath    with exertion  . Stroke [facility])    TIA-22 yrs ago    Past Surgical History:  Procedure Laterality Date  . ABDOMINAL HYSTERECTOMY  2004   partial d/t fibroid tumors  . ANTERIOR CERVICAL DECOMP/DISCECTOMY FUSION  11/24/2011   Procedure: ANTERIOR CERVICAL DECOMPRESSION/DISCECTOMY FUSION 3 LEVELS;  Surgeon: Hosie Spangle, MD;  Location: [place] [facility];  Service: Neurosurgery;  Laterality: N/A;  Cervical three-four, four-five,five-six anterior cervical decompression with fusion plating and bonegraft  . BREAST SURGERY  2002   breast reduction  . CERVICAL CONE BIOPSY  20+yrs ago   d/t cervical cancer  .  ECTOPIC PREGNANCY SURGERY  1987/1989  . right hand  90's   cyst removed from top of hand     reports that she has been smoking Cigarettes.  She has a 32.00 pack-year smoking history. She has never used smokeless tobacco. She reports that she drinks about 0.6 oz of alcohol per week . She reports that she does not use drugs.  No Known Allergies  Family History  Problem Relation Age of Onset  . Anesthesia problems Neg Hx   . Hypotension Neg Hx   . Malignant hyperthermia Neg Hx   . Pseudochol deficiency Neg Hx     Prior to Admission medications   Medication Sig Start Date End Date Taking? Authorizing Provider  albuterol (PROVENTIL HFA;VENTOLIN HFA) 108  (90 BASE) MCG/ACT inhaler Inhale 2 puffs into the lungs every 6 (six) hours as needed. For shortness of breath   Yes Historical Provider, MD  lisinopril-hydrochlorothiazide (PRINZIDE,ZESTORETIC) 20-12.5 MG per tablet Take 1 tablet by mouth daily.   Yes Historical Provider, MD  loratadine (CLARITIN) 10 MG tablet Take 10 mg by mouth daily.   Yes Historical Provider, MD  metoprolol tartrate (LOPRESSOR) 25 MG tablet Take 25 mg by mouth 2 (two) times daily.   Yes Historical Provider, MD  Multiple Vitamin (MULITIVITAMIN WITH MINERALS) TABS Take 1 tablet by mouth daily.   Yes Historical Provider, MD  nitroGLYCERIN (NITROSTAT) 0.4 MG SL tablet Place 0.4 mg under the tongue every 5 (five) minutes as needed. For chest pain   Yes Historical Provider, MD  ondansetron (ZOFRAN) 4 MG tablet Take 1 tablet (4 mg total) by mouth every 6 (six) hours. As needed for nausea or vomiting 04/29/16  Yes Tammy Triplett, PA-C  ranitidine (ZANTAC) 150 MG tablet Take 150 mg by mouth daily.   Yes Historical Provider, MD  sertraline (ZOLOFT) 50 MG tablet Take 50 mg by mouth daily as needed. depression   Yes Historical Provider, MD  naproxen (NAPROSYN) 500 MG tablet Take 1 tablet (500 mg total) by mouth 2 (two) times daily with a meal. Patient not taking: Reported on 07/31/2016 04/29/16   Kem Parkinson, PA-C    Physical Exam: Vitals:   07/31/16 1415 07/31/16 1445 07/31/16 1447 07/31/16 1530  BP:   94/71 97/65  Pulse: 113 100 98 94  Resp: 17 16 17 20   Temp:    99.2 F (37.3 C)  TempSrc:    Oral  SpO2: 100% 100% 100% 99%  Weight:    37.2 kg (82 lb 1.6 oz)  Height:    4\' 11"  (1.499 m)      Constitutional: NAD, calm, comfortable, Cachectic Vitals:   07/31/16 1415 07/31/16 1445 07/31/16 1447 07/31/16 1530  BP:   94/71 97/65  Pulse: 113 100 98 94  Resp: 17 16 17 20   Temp:    99.2 F (37.3 C)  TempSrc:    Oral  SpO2: 100% 100% 100% 99%  Weight:    37.2 kg (82 lb 1.6 oz)  Height:    4\' 11"  (1.499 m)   Eyes: PERRL,  lids and conjunctivae normal ENMT: Mucous membranes are Dry. Posterior pharynx clear of any exudate or lesions. Poor dentition.  Neck: normal, supple, no masses, no thyromegaly Respiratory: clear to auscultation bilaterally, no wheezing, no crackles. Normal respiratory effort. No accessory muscle use.  Cardiovascular: Regular rate and rhythm, no murmurs / rubs / gallops. 1+ extremity edema. 2+ pedal pulses. No carotid bruits.  Abdomen: no tenderness, no masses palpated. No hepatosplenomegaly. Bowel sounds positive.  Musculoskeletal: no clubbing / cyanosis. No joint deformity upper and lower extremities. Good ROM, no contractures. Normal muscle tone.  Skin: no rashes, lesions, ulcers. No induration Neurologic: CN 2-12 grossly intact. Sensation intact, DTR normal. Strength 5/5 in all 4.  Psychiatric: Patient is aggressive, combative .     Labs on Admission: I have personally reviewed following labs and imaging studies  CBC:  Recent Labs Lab 07/31/16 1133  WBC 13.2*  NEUTROABS 10.0*  HGB 9.7*  HCT 28.5*  MCV 97.3  PLT [id number]   Basic Metabolic Panel:  Recent Labs Lab 07/31/16 1133  NA 130*  K 1.9*  CL 90*  CO2 24  GLUCOSE 106*  BUN <5*  CREATININE 0.46  CALCIUM 7.8*  MG 1.4*   GFR: Estimated Creatinine Clearance: 48.3 mL/min (by C-G formula based on SCr of 0.46 mg/dL). Liver Function Tests:  Recent Labs Lab 07/31/16 1159  AST 99*  ALT 55*  ALKPHOS 256*  BILITOT 2.8*  PROT 6.6  ALBUMIN 2.7*    Recent Labs Lab 07/31/16 1159  LIPASE 14   Coagulation Profile:  Recent Labs Lab 07/31/16 1159  INR 1.04   Cardiac Enzymes:  Recent Labs Lab 07/31/16 1159  TROPONINI <0.03   Urine analysis:    Component Value Date/Time   COLORURINE YELLOW 09/13/2008 [place] 09/13/2008 1359   LABSPEC 1.015 09/13/2008 1359   PHURINE 5.5 09/13/2008 1359   GLUCOSEU NEGATIVE 09/13/2008 1359   HGBUR SMALL (A) 09/13/2008 1359   BILIRUBINUR NEGATIVE  09/13/2008 1359   KETONESUR NEGATIVE 09/13/2008 1359   PROTEINUR NEGATIVE 09/13/2008 1359   UROBILINOGEN 0.2 09/13/2008 1359   NITRITE NEGATIVE 09/13/2008 1359   LEUKOCYTESUR NEGATIVE 09/13/2008 1359   Radiological Exams on Admission: Dg Chest 2 View  Result Date: 07/31/2016 CLINICAL DATA:  Chest pain, vomiting for several days. EXAM: CHEST  2 VIEW COMPARISON:  11/22/2011 FINDINGS: Heart and mediastinal contours are within normal limits. No focal opacities or effusions. No acute bony abnormality. IMPRESSION: No active cardiopulmonary disease. Electronically Signed   By: Rolm Baptise M.D.   On: 07/31/2016 12:43   Dg Ankle Complete Left  Result Date: 07/31/2016 CLINICAL DATA:  Left foot and ankle pain for the past 2 weeks. No known injury. EXAM: LEFT ANKLE COMPLETE - 3+ VIEW COMPARISON:  Left foot radiographs - earlier same day. FINDINGS: There is rather diffuse soft tissue swelling about the ankle and hindfoot, worse laterally. There is an age-indeterminate punctate (approximately 0.5 cm) ossicle adjacent to the tip of the fibular which may represent an age-indeterminate avulsion fracture. Otherwise, no fracture or dislocation. Joint spaces are preserved. Ankle mortise is preserved. No definite ankle joint effusion. No plantar calcaneal spur. IMPRESSION: Diffuse soft tissue swelling about the ankle and hindfoot with ossicle adjacent to the tip of the fibula likely representative of an age-indeterminate avulsion fracture. Correlation for point tenderness at this location is recommended. Electronically Signed   By: Sandi Mariscal M.D.   On: 07/31/2016 12:58   Dg Foot Complete Left  Result Date: 07/31/2016 CLINICAL DATA:  Bilateral lower extremity edema. Left foot and ankle pain for the past 2 weeks. No known injury. EXAM: LEFT FOOT - COMPLETE 3+ VIEW COMPARISON:  Left ankle radiographs-earlier same day. FINDINGS: There is diffuse soft tissue swelling about the lower leg and hindfoot. No subcutaneous  emphysema or radiopaque foreign body. A punctate ossicle is noted adjacent to the distal end of the fibula. Otherwise, no acute fracture or dislocation. Joint spaces are preserved.  No significant hallux valgus deformity. There is rather diffuse periarticular demineralization, most conspicuously involving the proximal phalanx of the great digit as well as the distal end of the fifth metatarsal along with the distal end proximal phalanx of the fifth digit and to a lesser extent the second through fourth metatarsal heads. No definite erosions. No discrete areas of osteolysis to suggest osteomyelitis. IMPRESSION: 1. Punctate ossicle adjacent to the distal end of the fibula likely representative of age-indeterminate avulsive injury. Correlation for point tenderness at this location is recommended. 2. Diffuse soft tissue swelling about the lower leg and hindfoot. 3. Rather diffuse periarticular demineralization, most conspicuously involving the fifth digit and proximal phalanx of the great toe, nonspecific though could be seen in the setting of a metabolic bone disease. Clinical correlation is advised. Electronically Signed   By: Sandi Mariscal M.D.   On: 07/31/2016 12:54    Assessment/Plan Active Problems:   Hypokalemia   Hyponatremia   Alcohol abuse   Anemia   Diarrhea   Protein-calorie malnutrition, severe (HCC)   HTN (hypertension)   GERD (gastroesophageal reflux disease)   1. Hypokalemia. Likely related to nutritional deficiencies, diarrhea/vomiting. Review of medication list shows that she was prescribed thiazide. Patient reports that she's been taking this very inconsistently. We'll aggressively replace potassium. Magnesium is also low and will be replaced. Monitor on telemetry 2. Hyponatremia. Probably a combination of volume depletion as well as possible beer potomania. Start on gentle hydration with saline. Recheck BMP in the a.m. 3. HTN. Blood pressures are on the lower side. She has been  prescribed lisinopril/hydrochlorothiazide as an outpatient, but reports that she has been taking this very infrequently. Just continue to monitor for now. 4. GERD. Continue PPI. 5. Alcohol abuse. She does not have any signs of alcohol withdrawal this time. Reports her last drink of alcohol was earlier this morning. She will be placed on CIWA protocol with Ativan. Continue folic acid and thiamine. 6. Leg swelling. Patient does have bilateral leg swelling, worse on the left side. This is likely related to injury from recent fall resulting in small avulsion fracture fibula. ASO wrap was ordered by EDP. Will check venous Dopplers to rule out underlying DVT. She may also be third spacing fluids due to hypoalbuminemia. 7. Severe protein calorie malnutrition. Nutrition consult 8. Diarrhea. Reports to be a chronic issue. C. difficile ordered by EDP. 9. Anemia. Suspect this is related to nutritional deficiencies/alcohol. Anemia panel has been ordered.  DVT prophylaxis: Lovenox Code Status: Full Family Communication: Discussed with husband at the bedside Disposition Plan: Discharged once improved Consults called: None Admission status: Admit to inpatient   Kathie Dike, MD [facility] If 7PM-7AM, please contact night-coverage [email or web address] Password [facility]  07/31/2016, 6:33 PM

## 2016-07-31 NOTE — ED Notes (Signed)
Patient transported to X-ray 

## 2016-07-31 NOTE — ED Provider Notes (Signed)
[place] DEPT Provider Note   CSN: [id number] Arrival date & time: 07/31/16  1124  By signing my name below, I, Hansel Feinstein, attest that this documentation has been prepared under the direction and in the presence of Fredia Sorrow, MD. Electronically Signed: Hansel Feinstein, ED Scribe. 07/31/16. 11:53 AM.     History   Chief Complaint Chief Complaint  Patient presents with  . Leg Swelling    HPI Courtney Thornton is a 50 y.o. female brought in by ambulance who presents to the Emergency Department complaining of moderate, gradually worsening bilateral foot pain (R>L) and swelling onset last week. Pt also complains of nausea that began this morning, non-bloody diarrhea that began this morning, intermittent CP. Pt states her CP is not new and she has previously experienced similar pain. No worsening or alleviating factors noted. Pt denies taking OTC medications at home to improve symptoms. Pt denies fever, congestion, rhinorrhea, sore throat, visual disturbance, cough, SOB, abdominal pain, vomiting, dysuria, hematuria, back pain, neck pain, rash, HA, bruising easily.  Pt notes she consumed a beer today and drinks daily.   [facility]    The history is provided by the patient. No language interpreter was used.  Leg Pain   This is a new problem. The current episode started more than 2 days ago. The problem occurs constantly. The problem has been gradually worsening. The pain is present in the left foot and right foot. The quality of the pain is described as aching. The pain is moderate. Associated symptoms comments: Swelling. She has tried nothing for the symptoms. The treatment provided no relief.    Past Medical History:  Diagnosis Date  . Anxiety    takes Xanax daily  . Arthritis    shoulders  . Blood transfusion 2004  . Bronchitis    hx of-couple of years ago  . Bruises easily    platelets are always high  . Cancer (HCC)    cervical  .  Confusion   . Depression    takes Zoloft nightly  . Diarrhea   . Dizziness    hx of  . Emphysema   . Gastric ulcer   . GERD (gastroesophageal reflux disease)    takes Omeprazole and Ranitidine daily  . Headache(784.0)   . History of kidney stones 2000  . Hyperlipidemia    doesn't take any meds at present time  . Hypertension    takes Metoprolol and Lisinopril occ  . Insomnia    xanax and pain pills help  . Joint pain   . Joint swelling   . Migraines    last on e3/10/13  . Neck pain    stenosis and radiculopathy  . PONV (postoperative nausea and vomiting)   . Shortness of breath    with exertion  . Stroke [facility])    TIA-22 yrs ago    There are no active problems to display for this patient.   Past Surgical History:  Procedure Laterality Date  . ABDOMINAL HYSTERECTOMY  2004   partial d/t fibroid tumors  . ANTERIOR CERVICAL DECOMP/DISCECTOMY FUSION  11/24/2011   Procedure: ANTERIOR CERVICAL DECOMPRESSION/DISCECTOMY FUSION 3 LEVELS;  Surgeon: Hosie Spangle, MD;  Location: [place] [facility] OR[facility];  Service: Neurosurgery;  Laterality: N/A;  Cervical three-four, four-five,five-six anterior cervical decompression with fusion plating and bonegraft  . BREAST SURGERY  2002   breast reduction  . CERVICAL CONE BIOPSY  20+yrs ago   d/t cervical cancer  . ECTOPIC PREGNANCY  SURGERY  1987/1989  . right hand  90's   cyst removed from top of hand    OB History    No data available       Home Medications    Prior to Admission medications   Medication Sig Start Date End Date Taking? Authorizing Provider  albuterol (PROVENTIL HFA;VENTOLIN HFA) 108 (90 BASE) MCG/ACT inhaler Inhale 2 puffs into the lungs every 6 (six) hours as needed. For shortness of breath   Yes Historical Provider, MD  lisinopril-hydrochlorothiazide (PRINZIDE,ZESTORETIC) 20-12.5 MG per tablet Take 1 tablet by mouth daily.   Yes Historical Provider, MD  loratadine (CLARITIN) 10 MG tablet Take 10 mg by mouth daily.   Yes  Historical Provider, MD  metoprolol tartrate (LOPRESSOR) 25 MG tablet Take 25 mg by mouth 2 (two) times daily.   Yes Historical Provider, MD  Multiple Vitamin (MULITIVITAMIN WITH MINERALS) TABS Take 1 tablet by mouth daily.   Yes Historical Provider, MD  nitroGLYCERIN (NITROSTAT) 0.4 MG SL tablet Place 0.4 mg under the tongue every 5 (five) minutes as needed. For chest pain   Yes Historical Provider, MD  ondansetron (ZOFRAN) 4 MG tablet Take 1 tablet (4 mg total) by mouth every 6 (six) hours. As needed for nausea or vomiting 04/29/16  Yes Tammy Triplett, PA-C  ranitidine (ZANTAC) 150 MG tablet Take 150 mg by mouth daily.   Yes Historical Provider, MD  sertraline (ZOLOFT) 50 MG tablet Take 50 mg by mouth daily as needed. depression   Yes Historical Provider, MD  naproxen (NAPROSYN) 500 MG tablet Take 1 tablet (500 mg total) by mouth 2 (two) times daily with a meal. Patient not taking: Reported on 07/31/2016 04/29/16   Kem Parkinson, PA-C    Family History Family History  Problem Relation Age of Onset  . Anesthesia problems Neg Hx   . Hypotension Neg Hx   . Malignant hyperthermia Neg Hx   . Pseudochol deficiency Neg Hx     Social History Social History  Substance Use Topics  . Smoking status: Current Every Day Smoker    Packs/day: 1.00    Years: 32.00    Types: Cigarettes  . Smokeless tobacco: Never Used  . Alcohol use 0.6 oz/week    1 Cans of beer per week     Comment: 1.5 40oz a day     Allergies   Patient has no known allergies.   Review of Systems Review of Systems  Constitutional: Negative for fever.  HENT: Negative for congestion, rhinorrhea and sore throat.   Eyes: Negative for visual disturbance.  Respiratory: Negative for cough and shortness of breath.   Cardiovascular: Positive for chest pain and leg swelling.  Gastrointestinal: Positive for diarrhea and nausea. Negative for abdominal pain and vomiting.  Genitourinary: Negative for dysuria and hematuria.    Musculoskeletal: Positive for arthralgias. Negative for back pain and neck pain.  Skin: Negative for rash.  Neurological: Negative for headaches.  Hematological: Does not bruise/bleed easily.  Psychiatric/Behavioral: Negative for confusion.     Physical Exam Updated Vital Signs BP 104/74   Pulse 94   Temp 97.5 F (36.4 C) (Oral)   Resp 15   Ht 4\' 11"  (1.499 m)   Wt 43.1 kg   SpO2 100%   BMI 19.19 kg/m   Physical Exam  Constitutional: She is oriented to person, place, and time. She appears well-developed and well-nourished.  HENT:  Head: Normocephalic.  Mucous membranes dry.   Eyes: Conjunctivae and EOM are normal. Pupils are  equal, round, and reactive to light. Scleral icterus is present.  Cardiovascular: Normal rate, regular rhythm and normal heart sounds.   No murmur heard. Pulmonary/Chest: Effort normal and breath sounds normal. No respiratory distress. She has no wheezes. She has no rales.  Abdominal: Soft. She exhibits no distension. There is no tenderness.  Decreased bowel sounds. Abdomen flat and firm.   Musculoskeletal: Normal range of motion. She exhibits no edema.  No edema to bilateral ankles. Swelling to bilateral feet. Most of the swelling of the left foot is at the forefoot. Both feet are warm and there is no redness.   Neurological: She is alert and oriented to person, place, and time. No cranial nerve deficit or sensory deficit. She exhibits normal muscle tone. Coordination normal.  Skin: Skin is warm and dry.  Psychiatric: She has a normal mood and affect. Her behavior is normal.  Nursing note and vitals reviewed.   ED Treatments / Results   DIAGNOSTIC STUDIES: Oxygen Saturation is 100% on RA, normal by my interpretation.    COORDINATION OF CARE: 11:52 AM Discussed treatment plan with pt at bedside which includes lab work and pt agreed to plan.    Labs (all labs ordered are listed, but only abnormal results are displayed) Labs Reviewed  CBC WITH  DIFFERENTIAL/PLATELET - Abnormal; Notable for the following:       Result Value   WBC 13.2 (*)    RBC 2.93 (*)    Hemoglobin 9.7 (*)    HCT 28.5 (*)    Neutro Abs 10.0 (*)    All other components within normal limits  BASIC METABOLIC PANEL - Abnormal; Notable for the following:    Sodium 130 (*)    Potassium 1.9 (*)    Chloride 90 (*)    Glucose, Bld 106 (*)    BUN <5 (*)    Calcium 7.8 (*)    Anion gap 16 (*)    All other components within normal limits  ETHANOL - Abnormal; Notable for the following:    Alcohol, Ethyl (B) 34 (*)    All other components within normal limits  HEPATIC FUNCTION PANEL - Abnormal; Notable for the following:    Albumin 2.7 (*)    AST 99 (*)    ALT 55 (*)    Alkaline Phosphatase 256 (*)    Total Bilirubin 2.8 (*)    Bilirubin, Direct 1.3 (*)    Indirect Bilirubin 1.5 (*)    All other components within normal limits  LIPASE, BLOOD  TROPONIN I  PROTIME-INR   Results for orders placed or performed during the hospital encounter of 07/31/16  CBC with Differential  Result Value Ref Range   WBC 13.2 (H) 4.0 - 10.5 K/uL   RBC 2.93 (L) 3.87 - 5.11 MIL/uL   Hemoglobin 9.7 (L) 12.0 - 15.0 g/dL   HCT 28.5 (L) 36.0 - 46.0 %   MCV 97.3 78.0 - 100.0 fL   MCH 33.1 26.0 - 34.0 pg   MCHC 34.0 30.0 - 36.0 g/dL   RDW 14.3 11.5 - 15.5 %   Platelets 286 150 - 400 K/uL   Neutrophils Relative % 75 %   Neutro Abs 10.0 (H) 1.7 - 7.7 K/uL   Lymphocytes Relative 15 %   Lymphs Abs 1.9 0.7 - 4.0 K/uL   Monocytes Relative 7 %   Monocytes Absolute 1.0 0.1 - 1.0 K/uL   Eosinophils Relative 2 %   Eosinophils Absolute 0.3 0.0 - 0.7 K/uL  Basophils Relative 1 %   Basophils Absolute 0.1 0.0 - 0.1 K/uL  Basic metabolic panel  Result Value Ref Range   Sodium 130 (L) 135 - 145 mmol/L   Potassium 1.9 (LL) 3.5 - 5.1 mmol/L   Chloride 90 (L) 101 - 111 mmol/L   CO2 24 22 - 32 mmol/L   Glucose, Bld 106 (H) 65 - 99 mg/dL   BUN <5 (L) 6 - 20 mg/dL   Creatinine, Ser 0.46  0.44 - 1.00 mg/dL   Calcium 7.8 (L) 8.9 - 10.3 mg/dL   GFR calc non Af Amer >60 >60 mL/min   GFR calc Af Amer >60 >60 mL/min   Anion gap 16 (H) 5 - 15  Ethanol  Result Value Ref Range   Alcohol, Ethyl (B) 34 (H) <5 mg/dL  Hepatic function panel  Result Value Ref Range   Total Protein 6.6 6.5 - 8.1 g/dL   Albumin 2.7 (L) 3.5 - 5.0 g/dL   AST 99 (H) 15 - 41 U/L   ALT 55 (H) 14 - 54 U/L   Alkaline Phosphatase 256 (H) 38 - 126 U/L   Total Bilirubin 2.8 (H) 0.3 - 1.2 mg/dL   Bilirubin, Direct 1.3 (H) 0.1 - 0.5 mg/dL   Indirect Bilirubin 1.5 (H) 0.3 - 0.9 mg/dL  Lipase, blood  Result Value Ref Range   Lipase 14 11 - 51 U/L  Troponin I  Result Value Ref Range   Troponin I <0.03 <0.03 ng/mL  Protime-INR  Result Value Ref Range   Prothrombin Time 13.6 11.4 - 15.2 seconds   INR 1.04     EKG  EKG Interpretation None       Radiology Dg Chest 2 View  Result Date: 07/31/2016 CLINICAL DATA:  Chest pain, vomiting for several days. EXAM: CHEST  2 VIEW COMPARISON:  11/22/2011 FINDINGS: Heart and mediastinal contours are within normal limits. No focal opacities or effusions. No acute bony abnormality. IMPRESSION: No active cardiopulmonary disease. Electronically Signed   By: Rolm Baptise M.D.   On: 07/31/2016 12:43   Dg Ankle Complete Left  Result Date: 07/31/2016 CLINICAL DATA:  Left foot and ankle pain for the past 2 weeks. No known injury. EXAM: LEFT ANKLE COMPLETE - 3+ VIEW COMPARISON:  Left foot radiographs - earlier same day. FINDINGS: There is rather diffuse soft tissue swelling about the ankle and hindfoot, worse laterally. There is an age-indeterminate punctate (approximately 0.5 cm) ossicle adjacent to the tip of the fibular which may represent an age-indeterminate avulsion fracture. Otherwise, no fracture or dislocation. Joint spaces are preserved. Ankle mortise is preserved. No definite ankle joint effusion. No plantar calcaneal spur. IMPRESSION: Diffuse soft tissue swelling  about the ankle and hindfoot with ossicle adjacent to the tip of the fibula likely representative of an age-indeterminate avulsion fracture. Correlation for point tenderness at this location is recommended. Electronically Signed   By: Sandi Mariscal M.D.   On: 07/31/2016 12:58   Dg Foot Complete Left  Result Date: 07/31/2016 CLINICAL DATA:  Bilateral lower extremity edema. Left foot and ankle pain for the past 2 weeks. No known injury. EXAM: LEFT FOOT - COMPLETE 3+ VIEW COMPARISON:  Left ankle radiographs-earlier same day. FINDINGS: There is diffuse soft tissue swelling about the lower leg and hindfoot. No subcutaneous emphysema or radiopaque foreign body. A punctate ossicle is noted adjacent to the distal end of the fibula. Otherwise, no acute fracture or dislocation. Joint spaces are preserved. No significant hallux valgus deformity. There is rather  diffuse periarticular demineralization, most conspicuously involving the proximal phalanx of the great digit as well as the distal end of the fifth metatarsal along with the distal end proximal phalanx of the fifth digit and to a lesser extent the second through fourth metatarsal heads. No definite erosions. No discrete areas of osteolysis to suggest osteomyelitis. IMPRESSION: 1. Punctate ossicle adjacent to the distal end of the fibula likely representative of age-indeterminate avulsive injury. Correlation for point tenderness at this location is recommended. 2. Diffuse soft tissue swelling about the lower leg and hindfoot. 3. Rather diffuse periarticular demineralization, most conspicuously involving the fifth digit and proximal phalanx of the great toe, nonspecific though could be seen in the setting of a metabolic bone disease. Clinical correlation is advised. Electronically Signed   By: Sandi Mariscal M.D.   On: 07/31/2016 12:54    Procedures Procedures (including critical care time) CRITICAL CARE Performed by: Fredia Sorrow Total critical care time: 30  minutes Critical care time was exclusive of separately billable procedures and treating other patients. Critical care was necessary to treat or prevent imminent or life-threatening deterioration. Critical care was time spent personally by me on the following activities: development of treatment plan with patient and/or surrogate as well as nursing, discussions with consultants, evaluation of patient's response to treatment, examination of patient, obtaining history from patient or surrogate, ordering and performing treatments and interventions, ordering and review of laboratory studies, ordering and review of radiographic studies, pulse oximetry and re-evaluation of patient's condition.   Medications Ordered in ED Medications  0.9 %  sodium chloride infusion ( Intravenous New Bag/Given 07/31/16 1206)  potassium chloride 10 mEq in 100 mL IVPB (10 mEq Intravenous New Bag/Given 07/31/16 1340)  sodium chloride 0.9 % bolus 500 mL (0 mLs Intravenous Stopped 07/31/16 1248)  ondansetron (ZOFRAN) injection 4 mg (4 mg Intravenous Given 07/31/16 1206)     Initial Impression / Assessment and Plan / ED Course  I have reviewed the triage vital signs and the nursing notes.  Pertinent labs & imaging results that were available during my care of the patient were reviewed by me and considered in my medical decision making (see chart for details).  Clinical Course    Patient requires admission and monitoring for severe hypokalemia. Potassium 1.9. Patient to receive 2 IV runs of 10 mEq of potassium. Will probably require more. Patient relates diarrhea. Patient admits to drinking alcohol daily. Patient denies any vomiting. No significant abdominal pain. Patient's main complaint was bilateral leg swelling. She has swelling to the left foot and ankle area that may represent an ankle sprain but no point tenderness. Patient has no recollection of any fall or injury. For the ankle would recommend just ASO wrap. Patient  showing no signs of alcohol withdrawal at this time. Blood level on arrival was 34. Patient liver function tests consistent with alcohol abuse. CBC shows evidence of an elevated white blood cell count as well as a anemia. Blood transfusion not required at this time.   Final Clinical Impressions(s) / ED Diagnoses   Final diagnoses:  Hypokalemia  Alcohol abuse    New Prescriptions New Prescriptions   No medications on file   I personally performed the services described in this documentation, which was scribed in my presence. The recorded information has been reviewed and considered.   Fredia Sorrow, MD 07/31/16 1351

## 2016-08-01 ENCOUNTER — Inpatient Hospital Stay (HOSPITAL_COMMUNITY): Payer: Medicare Other

## 2016-08-01 DIAGNOSIS — I509 Heart failure, unspecified: Secondary | ICD-10-CM

## 2016-08-01 LAB — COMPREHENSIVE METABOLIC PANEL
ALBUMIN: 2 g/dL — AB (ref 3.5–5.0)
ALK PHOS: 224 U/L — AB (ref 38–126)
ALT: 43 U/L (ref 14–54)
AST: 83 U/L — ABNORMAL HIGH (ref 15–41)
Anion gap: 6 (ref 5–15)
BILIRUBIN TOTAL: 1.5 mg/dL — AB (ref 0.3–1.2)
BUN: <5 mg/dL — ABNORMAL LOW (ref 6–20)
CALCIUM: 7.2 mg/dL — AB (ref 8.9–10.3)
CO2: 27 mmol/L (ref 22–32)
CREATININE: 0.3 mg/dL — AB (ref 0.44–1.00)
Chloride: 100 mmol/L — ABNORMAL LOW (ref 101–111)
GFR calc non Af Amer: >60 mL/min (ref >60)
GLUCOSE: 145 mg/dL — AB (ref 65–99)
Potassium: 3.8 mmol/L (ref 3.5–5.1)
SODIUM: 133 mmol/L — AB (ref 135–145)
TOTAL PROTEIN: 5.1 g/dL — AB (ref 6.5–8.1)

## 2016-08-01 LAB — ECHOCARDIOGRAM COMPLETE
EERAT: 4.18
EWDT: 187 ms
FS: 33 % (ref 28–44)
HEIGHTINCHES: 59 in
IVS/LV PW RATIO, ED: 1.01
LA diam end sys: 27 mm
LA diam index: 2.14 cm/m2
LA vol A4C: 21.1 ml
LA vol: 21.7 mL
LASIZE: 27 mm
LAVOLIN: 17.2 mL/m2
LDCA: 2.01 cm2
LV E/e' medial: 4.18
LV E/e'average: 4.18
LV PW d: 6.38 mm — AB (ref 0.6–1.1)
LV TDI E'LATERAL: 15.9
LV TDI E'MEDIAL: 8.81
LV e' LATERAL: 15.9 cm/s
LVOTD: 16 mm
MV Dec: 187
MV pk A vel: 71.3 m/s
MV pk E vel: 66.5 m/s
RV LATERAL S' VELOCITY: 14 cm/s
RV TAPSE: 20.6 mm
RV sys press: 30 mmHg
Reg peak vel: 259 cm/s
TR max vel: 259 cm/s
WEIGHTICAEL: 1313.6 [oz_av]

## 2016-08-01 LAB — CBC
HCT: 25.2 % — ABNORMAL LOW (ref 36.0–46.0)
Hemoglobin: 8.5 g/dL — ABNORMAL LOW (ref 12.0–15.0)
MCH: 33.1 pg (ref 26.0–34.0)
MCHC: 33.7 g/dL (ref 30.0–36.0)
MCV: 98.1 fL (ref 78.0–100.0)
PLATELETS: 265 10*3/uL (ref 150–400)
RBC: 2.57 MIL/uL — ABNORMAL LOW (ref 3.87–5.11)
RDW: 14.7 % (ref 11.5–15.5)
WBC: 11.8 10*3/uL — ABNORMAL HIGH (ref 4.0–10.5)

## 2016-08-01 LAB — C DIFFICILE QUICK SCREEN W PCR REFLEX
C DIFFICILE (CDIFF) INTERP: NOT DETECTED
C Diff antigen: NEGATIVE
C Diff toxin: NEGATIVE

## 2016-08-01 LAB — MAGNESIUM: Magnesium: 2 mg/dL (ref 1.7–2.4)

## 2016-08-01 MED ORDER — ENOXAPARIN SODIUM 30 MG/0.3ML ~~LOC~~ SOLN
30.0000 mg | SUBCUTANEOUS | Status: DC
  Administered 2016-08-01 – 2016-08-04 (×4): 30 mg via SUBCUTANEOUS
  Filled 2016-08-01 (×4): qty 0.3

## 2016-08-01 MED ORDER — FUROSEMIDE 20 MG PO TABS
20.0000 mg | ORAL_TABLET | Freq: Every day | ORAL | Status: DC
  Administered 2016-08-01 – 2016-08-03 (×3): 20 mg via ORAL
  Filled 2016-08-01 (×3): qty 1

## 2016-08-01 MED ORDER — POTASSIUM CHLORIDE 20 MEQ PO PACK
40.0000 meq | PACK | Freq: Every day | ORAL | Status: DC
  Administered 2016-08-01: 40 meq via ORAL
  Filled 2016-08-01: qty 2

## 2016-08-01 NOTE — Progress Notes (Signed)
  Echocardiogram 2D Echocardiogram has been performed.  Courtney Thornton 08/01/2016, 1:06 PM

## 2016-08-01 NOTE — Progress Notes (Signed)
PROGRESS NOTE    Courtney Thornton  [id number] DOB: Jul 17, 1966 DOA: 07/31/2016 PCP: [facility]    Brief Narrative:  61 yof with a history of alcohol abuse, anxiety, depression, GERD, HLD, and HTN, presents to the ED with complaints of bilateral worsening foot pain and swelling. Per husband, she has had recurrent falling. While in the ED, she was noted to be hypokalemic and hyponatremic. Labs revealed alkaline phosphatase 256, albumin 2.7, AST 99, ALT 55, T. Bili.  2.8, WBC 13.2, and Hgb 9.7. Her blood alcohol level is noted to be at 34. X-ray of ankle shows diffuse soft tissue swelling about the ankle and hindfoot with ossicle adjacent to the tip of the fibula likely representative of an age-indeterminate avulsion fracture.She has been admitted for further evaluation.   Assessment & Plan:   Active Problems:   Hypokalemia   Hyponatremia   Alcohol abuse   Anemia   Diarrhea   Protein-calorie malnutrition, severe (HCC)   HTN (hypertension)   GERD (gastroesophageal reflux disease)  1. Hypokalemia. Likely related to nutritional deficiencies, diarrhea/vomiting. Review of medication list shows that she was prescribed a thiazide. Patient reports that she's been taking this very inconsistently. Potassium and magnesium have improved with replacement..  2. Hyponatremia. Probably a combination of volume depletion as well as possible beer potomania. Improving with saline. 3. HTN. Blood pressures are on the lower side. She has been prescribed lisinopril/hydrochlorothiazide as an outpatient, but reports that she has been taking this very infrequently. Continue to monitor for now. 4. GERD. Continue PPI. 5. Alcohol abuse. She does not have any signs of alcohol withdrawal this time. Reports her last drink of alcohol was on the day of admission. Continue on CIWA protocol with Ativan. Continue folic acid and thiamine. 6. Leg swelling. Patient does have bilateral leg swelling,  worse on the left side. BNP is mildly elevated at 375. With history of alcohol abuse, will order 2D echo to evaluate LVEF. She does have an injury from recent fall resulting in small avulsion fracture fibula, which could also be contributing to pain/swelling. ASO wrap was ordered by EDP. Will check venous Dopplers to rule out underlying DVT. She may also be third spacing fluids due to hypoalbuminemia. Start on low dose lasix. 7. Severe protein calorie malnutrition. Nutrition consult 8. Diarrhea. Reports to be a chronic issue. C. difficile ordered by EDP. No further diarrhea since admission 9. Anemia. Suspect this is related to nutritional deficiencies/alcohol. Anemia panel has been ordered. 10. Frequent falls. Will consult physical therapy.  DVT prophylaxis: lovenox Code Status: Full  Family Communication: daughter at bedside Disposition Plan: Discharge home once improved.    Consultants:   Nutrition   Procedures:   None   Antimicrobials:   None    Subjective: Feeling better today. Still feels weak. Has pain in left leg  Objective: Vitals:   07/31/16 1530 07/31/16 2200 07/31/16 2245 08/01/16 0400  BP: 97/65 (!) 84/55 (!) 91/57 99/70  Pulse: 94 (!) 109  (!) 105  Resp: 20 16  15   Temp: 99.2 F (37.3 C) 98.4 F (36.9 C)  98.4 F (36.9 C)  TempSrc: Oral Axillary  Oral  SpO2: 99% 98%  97%  Weight: 37.2 kg (82 lb 1.6 oz)     Height: 4\' 11"  (1.499 m)       Intake/Output Summary (Last 24 hours) at 08/01/16 0803 Last data filed at 08/01/16 0603  Gross per 24 hour  Intake  1452.5 ml  Output                0 ml  Net           1452.5 ml   Filed Weights   07/31/16 1128 07/31/16 1530  Weight: 43.1 kg (95 lb) 37.2 kg (82 lb 1.6 oz)    Examination:  General exam: Appears calm and comfortable  Respiratory system: Clear to auscultation. Respiratory effort normal. Cardiovascular system: S1 & S2 heard, RRR. No JVD, murmurs, rubs, gallops or clicks. 1+ pedal  edema. Gastrointestinal system: Abdomen is nondistended, soft and nontender. No organomegaly or masses felt. Normal bowel sounds heard. Central nervous system: Alert and oriented. No focal neurological deficits. Extremities: Symmetric 5 x 5 power. Skin: No rashes, lesions or ulcers Psychiatry: Judgement and insight appear normal. Mood & affect appropriate.     Data Reviewed: I have personally reviewed following labs and imaging studies  CBC:  Recent Labs Lab 07/31/16 1133 08/01/16 0710  WBC 13.2* 11.8*  NEUTROABS 10.0*  --   HGB 9.7* 8.5*  HCT 28.5* 25.2*  MCV 97.3 98.1  PLT 286 [id number]   Basic Metabolic Panel:  Recent Labs Lab 07/31/16 1133  NA 130*  K 1.9*  CL 90*  CO2 24  GLUCOSE 106*  BUN <5*  CREATININE 0.46  CALCIUM 7.8*  MG 1.4*   GFR: Estimated Creatinine Clearance: 48.3 mL/min (by C-G formula based on SCr of 0.46 mg/dL). Liver Function Tests:  Recent Labs Lab 07/31/16 1159  AST 99*  ALT 55*  ALKPHOS 256*  BILITOT 2.8*  PROT 6.6  ALBUMIN 2.7*    Recent Labs Lab 07/31/16 1159  LIPASE 14   No results for input(s): AMMONIA in the last 168 hours. Coagulation Profile:  Recent Labs Lab 07/31/16 1159  INR 1.04   Cardiac Enzymes:  Recent Labs Lab 07/31/16 1159  TROPONINI <0.03   BNP (last 3 results) No results for input(s): PROBNP in the last 8760 hours. HbA1C: No results for input(s): HGBA1C in the last 72 hours. CBG: No results for input(s): GLUCAP in the last 168 hours. Lipid Profile: No results for input(s): CHOL, HDL, LDLCALC, TRIG, CHOLHDL, LDLDIRECT in the last 72 hours. Thyroid Function Tests:  Recent Labs  07/31/16 1958  TSH 2.186   Anemia Panel:  Recent Labs  07/31/16 1958  RETICCTPCT 6.5*   Sepsis Labs: No results for input(s): PROCALCITON, LATICACIDVEN in the last 168 hours.  No results found for this or any previous visit (from the past 240 hour(s)).       Radiology Studies: Dg Chest 2 View  Result  Date: 07/31/2016 CLINICAL DATA:  Chest pain, vomiting for several days. EXAM: CHEST  2 VIEW COMPARISON:  11/22/2011 FINDINGS: Heart and mediastinal contours are within normal limits. No focal opacities or effusions. No acute bony abnormality. IMPRESSION: No active cardiopulmonary disease. Electronically Signed   By: Rolm Baptise M.D.   On: 07/31/2016 12:43   Dg Ankle Complete Left  Result Date: 07/31/2016 CLINICAL DATA:  Left foot and ankle pain for the past 2 weeks. No known injury. EXAM: LEFT ANKLE COMPLETE - 3+ VIEW COMPARISON:  Left foot radiographs - earlier same day. FINDINGS: There is rather diffuse soft tissue swelling about the ankle and hindfoot, worse laterally. There is an age-indeterminate punctate (approximately 0.5 cm) ossicle adjacent to the tip of the fibular which may represent an age-indeterminate avulsion fracture. Otherwise, no fracture or dislocation. Joint spaces are preserved. Ankle mortise is preserved. No definite  ankle joint effusion. No plantar calcaneal spur. IMPRESSION: Diffuse soft tissue swelling about the ankle and hindfoot with ossicle adjacent to the tip of the fibula likely representative of an age-indeterminate avulsion fracture. Correlation for point tenderness at this location is recommended. Electronically Signed   By: Sandi Mariscal M.D.   On: 07/31/2016 12:58   Dg Foot Complete Left  Result Date: 07/31/2016 CLINICAL DATA:  Bilateral lower extremity edema. Left foot and ankle pain for the past 2 weeks. No known injury. EXAM: LEFT FOOT - COMPLETE 3+ VIEW COMPARISON:  Left ankle radiographs-earlier same day. FINDINGS: There is diffuse soft tissue swelling about the lower leg and hindfoot. No subcutaneous emphysema or radiopaque foreign body. A punctate ossicle is noted adjacent to the distal end of the fibula. Otherwise, no acute fracture or dislocation. Joint spaces are preserved. No significant hallux valgus deformity. There is rather diffuse periarticular  demineralization, most conspicuously involving the proximal phalanx of the great digit as well as the distal end of the fifth metatarsal along with the distal end proximal phalanx of the fifth digit and to a lesser extent the second through fourth metatarsal heads. No definite erosions. No discrete areas of osteolysis to suggest osteomyelitis. IMPRESSION: 1. Punctate ossicle adjacent to the distal end of the fibula likely representative of age-indeterminate avulsive injury. Correlation for point tenderness at this location is recommended. 2. Diffuse soft tissue swelling about the lower leg and hindfoot. 3. Rather diffuse periarticular demineralization, most conspicuously involving the fifth digit and proximal phalanx of the great toe, nonspecific though could be seen in the setting of a metabolic bone disease. Clinical correlation is advised. Electronically Signed   By: Sandi Mariscal M.D.   On: 07/31/2016 12:54        Scheduled Meds: . enoxaparin (LOVENOX) injection  40 mg Subcutaneous Q24H  . feeding supplement (ENSURE ENLIVE)  237 mL Oral BID BM  . folic acid  1 mg Oral Daily  . Influenza vac split quadrivalent PF  0.5 mL Intramuscular Tomorrow-1000  . LORazepam  0-4 mg Intravenous Q6H   Followed by  . [START ON 08/02/2016] LORazepam  0-4 mg Intravenous Q12H  . multivitamin with minerals  1 tablet Oral Daily  . pantoprazole  40 mg Oral BID AC  . pneumococcal 23 valent vaccine  0.5 mL Intramuscular Tomorrow-1000  . potassium chloride  40 mEq Oral Q3H  . sodium chloride flush  3 mL Intravenous Q12H  . thiamine  100 mg Oral Daily   Or  . thiamine  100 mg Intravenous Daily   Continuous Infusions: . 0.9 % NaCl with KCl 40 mEq / L 100 mL/hr (07/31/16 1854)     LOS: 1 day    Time spent: 25 minutes     Kathie Dike, MD [facility] If 7PM-7AM, please contact night-coverage [email or web address] Password [email or web address] 08/01/2016, 8:03 AM

## 2016-08-01 NOTE — Progress Notes (Signed)
0756 Lab result BNP 375.0, MD notified.

## 2016-08-01 NOTE — Progress Notes (Signed)
1130 stool sample obtained and sent to lab. Test results pending at this time.

## 2016-08-01 NOTE — Progress Notes (Signed)
C.Diff negative, enteric precautions d/c.

## 2016-08-02 LAB — CBC
HEMATOCRIT: 27.3 % — AB (ref 36.0–46.0)
HEMOGLOBIN: 8.9 g/dL — AB (ref 12.0–15.0)
MCH: 32.8 pg (ref 26.0–34.0)
MCHC: 32.6 g/dL (ref 30.0–36.0)
MCV: 100.7 fL — ABNORMAL HIGH (ref 78.0–100.0)
Platelets: 255 10*3/uL (ref 150–400)
RBC: 2.71 MIL/uL — AB (ref 3.87–5.11)
RDW: 15.3 % (ref 11.5–15.5)
WBC: 11.3 10*3/uL — ABNORMAL HIGH (ref 4.0–10.5)

## 2016-08-02 LAB — COMPREHENSIVE METABOLIC PANEL
ALK PHOS: 236 U/L — AB (ref 38–126)
ALT: 45 U/L (ref 14–54)
ANION GAP: 3 — AB (ref 5–15)
AST: 91 U/L — ABNORMAL HIGH (ref 15–41)
Albumin: 2.1 g/dL — ABNORMAL LOW (ref 3.5–5.0)
BILIRUBIN TOTAL: 1.5 mg/dL — AB (ref 0.3–1.2)
BUN: <5 mg/dL — ABNORMAL LOW (ref 6–20)
CALCIUM: 8 mg/dL — AB (ref 8.9–10.3)
CO2: 26 mmol/L (ref 22–32)
CREATININE: 0.3 mg/dL — AB (ref 0.44–1.00)
Chloride: 102 mmol/L (ref 101–111)
GFR calc non Af Amer: >60 mL/min (ref >60)
GLUCOSE: 104 mg/dL — AB (ref 65–99)
Potassium: 5.5 mmol/L — ABNORMAL HIGH (ref 3.5–5.1)
SODIUM: 131 mmol/L — AB (ref 135–145)
TOTAL PROTEIN: 5.4 g/dL — AB (ref 6.5–8.1)

## 2016-08-02 LAB — IRON AND TIBC: IRON: 54 ug/dL (ref 28–170)

## 2016-08-02 LAB — VITAMIN B12: VITAMIN B 12: 2794 pg/mL — AB (ref 180–914)

## 2016-08-02 LAB — FERRITIN: Ferritin: 621 ng/mL — ABNORMAL HIGH (ref 11–307)

## 2016-08-02 LAB — FOLATE: FOLATE: 6.8 ng/mL (ref >5.9)

## 2016-08-02 MED ORDER — LOPERAMIDE HCL 2 MG PO CAPS
2.0000 mg | ORAL_CAPSULE | Freq: Three times a day (TID) | ORAL | Status: DC
  Administered 2016-08-02 – 2016-08-04 (×7): 2 mg via ORAL
  Filled 2016-08-02 (×7): qty 1

## 2016-08-02 NOTE — Care Management Note (Signed)
Case Management Note  Patient Details  Name: Courtney Thornton MRN: [id number] Date of Birth: Jan 27, 1966  Subjective/Objective:    Patient adm with hypokalemia. States she is  Ind with ADL's, lives with husband. Patient weighs  82 pounds, nutrition consult ordered. PT eval pending. Spoke with patient who states even if [facility] PT recommended she is not agreeable, but would consider OP PT.            Action/Plan: Will follow for needs.   Expected Discharge Date:      08/03/2016            Expected Discharge Plan:  [place]  In-House Referral:  NA  Discharge planning Services  CM Consult  Post Acute Care Choice:    Choice offered to:     DME Arranged:    DME Agency:     HH Arranged:    HH Agency:     Status of Service:  In process, will continue to follow  If discussed at Long Length of Stay Meetings, dates discussed:    Additional Comments:  Courtney Thornton, Courtney Reading, RN 08/02/2016, 11:54 AM

## 2016-08-02 NOTE — Progress Notes (Signed)
PROGRESS NOTE    KAYTLEN DEMETRIUS  [id number] DOB: 03-11-1966 DOA: 07/31/2016 PCP: [facility]   Brief Narrative:  50 yof with a history of alcohol abuse, anxiety, depression, GERD, HLD, and HTN, presents to the ED with complaints of bilateral worsening foot pain and swelling. Per husband, she has had recurrent falling. While in the ED, she was noted to be hypokalemic and hyponatremic. Labs revealed alkaline phosphatase 256, albumin 2.7, AST 99, ALT 55, T. Bili. 2.8, WBC 13.2, and Hgb 9.7. Her blood alcohol level is noted to be at 34. X-ray of ankleshows diffuse soft tissue swelling about the ankle and hindfoot with ossicle adjacent to the tip of the fibula likely representative of an age-indeterminate avulsion fracture.She has been admitted for further evaluation.   Assessment & Plan:   Active Problems:   Hypokalemia   Hyponatremia   Alcohol abuse   Anemia   Diarrhea   Protein-calorie malnutrition, severe (HCC)   HTN (hypertension)   GERD (gastroesophageal reflux disease)  1. Hypokalemia. Likely related to nutritional deficiencies, diarrhea/vomiting. Review of medication list shows that she was prescribed a thiazide. Patient reports that she's been taking this very inconsistently. Potassium and magnesium have improved with replacement. 2. Hyponatremia. Probably a combination of volume depletion as well as possible beer potomania. Improving with saline. 3. HTN. Blood pressures are on the lower side. She has been prescribed lisinopril/hydrochlorothiazide as an outpatient, but reports that she has been taking this very infrequently. Continue to monitor for now. 4. GERD. Continue PPI. 5. Alcohol abuse. She is mildly tremulous at this time. Reports her last drink of alcohol was on the day of admission. Continue on CIWAprotocol with Ativan. Continue folic acid and thiamine. 6. Leg swelling. Patient does have bilateral leg swelling, worse on the left side.  BNP is mildly elevated at 375. Echo shows normal EF. She does have an injury from recent fall resulting in small avulsion fracture fibula, which could also be contributing to pain/swelling. ASOwrap was ordered by EDP. Venous dopplers negative for DVT. She may also be third spacing fluids due to hypoalbuminemia. Overall edema has improved with lasix. 7. Severe protein calorie malnutrition. Nutrition consult 8. Diarrhea. Reports to be a chronic issue. C. difficile negative. She reports still having loose stools. Will start on imodium 9. Anemia. Anemia panel shows normal iron and elevated ferritin. Likely anemia of chronic disease. Hemoglobin has been stable. 10. Frequent falls. Physical therapy has assessed the patient and has recommended SNF. Will need to discuss with patient and see if she is agreeable  DVT prophylaxis: Lovenox   Code Status: Full  Family Communication: No family bedside Disposition Plan: Discharge home once improved.    Consultants:   Nutrition   Physical therapy   Procedures:  None   Antimicrobials:  None    Subjective: Still having loose stools. Feels tremulous  Objective: Vitals:   08/01/16 1400 08/01/16 2023 08/01/16 2104 08/02/16 0654  BP: 101/64  101/67 93/63  Pulse: 99  97 (!) 103  Resp: 16  18 20   Temp: 98.2 F (36.8 C)  97.7 F (36.5 C) 98.4 F (36.9 C)  TempSrc:   Oral Oral  SpO2: 98% 93% 98% 98%  Weight:      Height:        Intake/Output Summary (Last 24 hours) at 08/02/16 0805 Last data filed at 08/01/16 2244  Gross per 24 hour  Intake  363 ml  Output                0 ml  Net              363 ml   Filed Weights   07/31/16 1128 07/31/16 1530  Weight: 43.1 kg (95 lb) 37.2 kg (82 lb 1.6 oz)    Examination:  General exam: Appears calm and comfortable  Respiratory system: Clear to auscultation. Respiratory effort normal. Cardiovascular system: S1 & S2 heard, RRR. No JVD, murmurs, rubs, gallops or clicks. No pedal  edema. Gastrointestinal system: Abdomen is nondistended, soft and nontender. No organomegaly or masses felt. Normal bowel sounds heard. Central nervous system: Alert and oriented. No focal neurological deficits. Mildly tremulous Extremities: Symmetric 5 x 5 power. Skin: No rashes, lesions or ulcers Psychiatry: some confusion    Data Reviewed: I have personally reviewed following labs and imaging studies  CBC:  Recent Labs Lab 07/31/16 1133 08/01/16 0710 08/02/16 0706  WBC 13.2* 11.8* 11.3*  NEUTROABS 10.0*  --   --   HGB 9.7* 8.5* 8.9*  HCT 28.5* 25.2* 27.3*  MCV 97.3 98.1 100.7*  PLT 286 265 [id number]   Basic Metabolic Panel:  Recent Labs Lab 07/31/16 1133 08/01/16 0710 08/02/16 0706  NA 130* 133* 131*  K 1.9* 3.8 5.5*  CL 90* 100* 102  CO2 24 27 26   GLUCOSE 106* 145* 104*  BUN <5* <5* <5*  CREATININE 0.46 0.30* 0.30*  CALCIUM 7.8* 7.2* 8.0*  MG 1.4* 2.0  --    GFR: Estimated Creatinine Clearance: 48.3 mL/min (by C-G formula based on SCr of 0.3 mg/dL (L)). Liver Function Tests:  Recent Labs Lab 07/31/16 1159 08/01/16 0710 08/02/16 0706  AST 99* 83* 91*  ALT 55* 43 45  ALKPHOS 256* 224* 236*  BILITOT 2.8* 1.5* 1.5*  PROT 6.6 5.1* 5.4*  ALBUMIN 2.7* 2.0* 2.1*    Recent Labs Lab 07/31/16 1159  LIPASE 14   No results for input(s): AMMONIA in the last 168 hours. Coagulation Profile:  Recent Labs Lab 07/31/16 1159  INR 1.04   Cardiac Enzymes:  Recent Labs Lab 07/31/16 1159  TROPONINI <0.03   BNP (last 3 results) No results for input(s): PROBNP in the last 8760 hours. HbA1C: No results for input(s): HGBA1C in the last 72 hours. CBG: No results for input(s): GLUCAP in the last 168 hours. Lipid Profile: No results for input(s): CHOL, HDL, LDLCALC, TRIG, CHOLHDL, LDLDIRECT in the last 72 hours. Thyroid Function Tests:  Recent Labs  07/31/16 1958  TSH 2.186   Anemia Panel:  Recent Labs  07/31/16 1958  VITAMINB12 2,794*  FOLATE 6.8    FERRITIN 621*  TIBC NOT CALCULATED  IRON 54  RETICCTPCT 6.5*   Sepsis Labs: No results for input(s): PROCALCITON, LATICACIDVEN in the last 168 hours.  Recent Results (from the past 240 hour(s))  C difficile quick scan w PCR reflex     Status: None   Collection Time: 07/31/16  2:18 PM  Result Value Ref Range Status   C Diff antigen NEGATIVE NEGATIVE Final   C Diff toxin NEGATIVE NEGATIVE Final   C Diff interpretation No C. difficile detected.  Final         Radiology Studies: Dg Chest 2 View  Result Date: 07/31/2016 CLINICAL DATA:  Chest pain, vomiting for several days. EXAM: CHEST  2 VIEW COMPARISON:  11/22/2011 FINDINGS: Heart and mediastinal contours are within normal limits. No focal opacities or effusions. No acute bony  abnormality. IMPRESSION: No active cardiopulmonary disease. Electronically Signed   By: Rolm Baptise M.D.   On: 07/31/2016 12:43   Dg Ankle Complete Left  Result Date: 07/31/2016 CLINICAL DATA:  Left foot and ankle pain for the past 2 weeks. No known injury. EXAM: LEFT ANKLE COMPLETE - 3+ VIEW COMPARISON:  Left foot radiographs - earlier same day. FINDINGS: There is rather diffuse soft tissue swelling about the ankle and hindfoot, worse laterally. There is an age-indeterminate punctate (approximately 0.5 cm) ossicle adjacent to the tip of the fibular which may represent an age-indeterminate avulsion fracture. Otherwise, no fracture or dislocation. Joint spaces are preserved. Ankle mortise is preserved. No definite ankle joint effusion. No plantar calcaneal spur. IMPRESSION: Diffuse soft tissue swelling about the ankle and hindfoot with ossicle adjacent to the tip of the fibula likely representative of an age-indeterminate avulsion fracture. Correlation for point tenderness at this location is recommended. Electronically Signed   By: Sandi Mariscal M.D.   On: 07/31/2016 12:58   US Venous Img Lower Bilateral  Result Date: 08/01/2016 CLINICAL DATA:  50 year old  female with a history of bilateral lower extremity swelling EXAM: BILATERAL LOWER EXTREMITY VENOUS DOPPLER ULTRASOUND TECHNIQUE: Gray-scale sonography with graded compression, as well as color Doppler and duplex ultrasound were performed to evaluate the lower extremity deep venous systems from the level of the common femoral vein and including the common femoral, femoral, profunda femoral, popliteal and calf veins including the posterior tibial, peroneal and gastrocnemius veins when visible. The superficial great saphenous vein was also interrogated. Spectral Doppler was utilized to evaluate flow at rest and with distal augmentation maneuvers in the common femoral, femoral and popliteal veins. COMPARISON:  None. FINDINGS: RIGHT LOWER EXTREMITY Common Femoral Vein: No evidence of thrombus. Normal compressibility, respiratory phasicity and response to augmentation. Saphenofemoral Junction: No evidence of thrombus. Normal compressibility and flow on color Doppler imaging. Profunda Femoral Vein: No evidence of thrombus. Normal compressibility and flow on color Doppler imaging. Femoral Vein: No evidence of thrombus. Normal compressibility, respiratory phasicity and response to augmentation. Popliteal Vein: No evidence of thrombus. Normal compressibility, respiratory phasicity and response to augmentation. Calf Veins: No evidence of thrombus. Normal compressibility and flow on color Doppler imaging. Superficial Great Saphenous Vein: No evidence of thrombus. Normal compressibility and flow on color Doppler imaging. Other Findings:  None. LEFT LOWER EXTREMITY Common Femoral Vein: No evidence of thrombus. Normal compressibility, respiratory phasicity and response to augmentation. Saphenofemoral Junction: No evidence of thrombus. Normal compressibility and flow on color Doppler imaging. Profunda Femoral Vein: No evidence of thrombus. Normal compressibility and flow on color Doppler imaging. Femoral Vein: No evidence of  thrombus. Normal compressibility, respiratory phasicity and response to augmentation. Popliteal Vein: No evidence of thrombus. Normal compressibility, respiratory phasicity and response to augmentation. Calf Veins: No evidence of thrombus. Normal compressibility and flow on color Doppler imaging. Superficial Great Saphenous Vein: No evidence of thrombus. Normal compressibility and flow on color Doppler imaging. Other Findings:  None. IMPRESSION: Sonographic survey of the bilateral lower extremities negative for DVT. Signed, Dulcy Fanny. Earleen Newport, DO [facility] Electronically Signed   By: Corrie Mckusick D.O.   On: 08/01/2016 10:50   Dg Foot Complete Left  Result Date: 07/31/2016 CLINICAL DATA:  Bilateral lower extremity edema. Left foot and ankle pain for the past 2 weeks. No known injury. EXAM: LEFT FOOT - COMPLETE 3+ VIEW COMPARISON:  Left ankle radiographs-earlier same day. FINDINGS: There is diffuse soft tissue swelling about the  lower leg and hindfoot. No subcutaneous emphysema or radiopaque foreign body. A punctate ossicle is noted adjacent to the distal end of the fibula. Otherwise, no acute fracture or dislocation. Joint spaces are preserved. No significant hallux valgus deformity. There is rather diffuse periarticular demineralization, most conspicuously involving the proximal phalanx of the great digit as well as the distal end of the fifth metatarsal along with the distal end proximal phalanx of the fifth digit and to a lesser extent the second through fourth metatarsal heads. No definite erosions. No discrete areas of osteolysis to suggest osteomyelitis. IMPRESSION: 1. Punctate ossicle adjacent to the distal end of the fibula likely representative of age-indeterminate avulsive injury. Correlation for point tenderness at this location is recommended. 2. Diffuse soft tissue swelling about the lower leg and hindfoot. 3. Rather diffuse periarticular  demineralization, most conspicuously involving the fifth digit and proximal phalanx of the great toe, nonspecific though could be seen in the setting of a metabolic bone disease. Clinical correlation is advised. Electronically Signed   By: Sandi Mariscal M.D.   On: 07/31/2016 12:54        Scheduled Meds: . enoxaparin (LOVENOX) injection  30 mg Subcutaneous Q24H  . feeding supplement (ENSURE ENLIVE)  237 mL Oral BID BM  . folic acid  1 mg Oral Daily  . furosemide  20 mg Oral Daily  . LORazepam  0-4 mg Intravenous Q6H   Followed by  . LORazepam  0-4 mg Intravenous Q12H  . multivitamin with minerals  1 tablet Oral Daily  . pantoprazole  40 mg Oral BID AC  . potassium chloride  40 mEq Oral Daily  . sodium chloride flush  3 mL Intravenous Q12H  . thiamine  100 mg Oral Daily   Or  . thiamine  100 mg Intravenous Daily   Continuous Infusions:   LOS: 2 days    Time spent: 25 minutes    Kathie Dike, MD [facility] If 7PM-7AM, please contact night-coverage [email or web address] Password [email or web address] 08/02/2016, 8:05 AM

## 2016-08-02 NOTE — Progress Notes (Signed)
Initial Nutrition Assessment  DOCUMENTATION CODES:   Severe malnutrition in context of chronic illness, Underweight  INTERVENTION:  Ensure Enlive po BID, each supplement provides 350 kcal and 20 grams of protein   Vitamin therapy: Multivitamin, thiamin, folic acid  Give pt copy of Alcohol abuse and Nutrition at discharge   NUTRITION DIAGNOSIS:   Malnutrition related to chronic illness as evidenced by severe depletion of body fat, severe depletion of muscle mass.  GOAL:   Patient will meet greater than or equal to 90% of their needs   MONITOR:  Po intake, labs and wt trends     REASON FOR ASSESSMENT:   Malnutrition Screening Tool, Consult Assessment of nutrition requirement/status  ASSESSMENT:  Courtney Thornton has a hx of ETOH and tobacco abuse, GERD, depression and  HTN.   She has taken a few bites of her lunch mostly a few carrots. Her po intake is very poor 0-50% of meals since arriving at hospital. Patient says she some vomiting after eating prior to admission (related to her GERD?). She denies chewing or swallow problems.  Diet hx: she is a poor historian- when asked about her usual food beverage intake she says "I don't remember that far".  Weight hx: pt weight same as in August. 2 years ago her weight was 105#. No recent significant changes but expect pt has been chronically undernourished due to her hx of substance abuse.  Nutrition-Focused physical exam findings are severe fat depletion, severe muscle depletion, and no edema. She has a boot on her left foot associated with an (avulsion fx) which is documented to have mild edema.       Recent Labs Lab 07/31/16 1133 08/01/16 0710 08/02/16 0706  NA 130* 133* 131*  K 1.9* 3.8 5.5*  CL 90* 100* 102  CO2 24 27 26   BUN <5* <5* <5*  CREATININE 0.46 0.30* 0.30*  CALCIUM 7.8* 7.2* 8.0*  MG 1.4* 2.0  --   GLUCOSE 106* 145* 104*    Labs: Sodium 131, Potassium 5.5, Albumin 2.1   Meds: lasix, PPI   Diet Order:   Diet Heart Room service appropriate? Yes; Fluid consistency: Thin  Skin:  Reviewed, no issues  Last BM:  11/19   Height:   Ht Readings from Last 1 Encounters:  07/31/16 4\' 11"  (1.499 m)    Weight:   Wt Readings from Last 1 Encounters:  07/31/16 82 lb 1.6 oz (37.2 kg)    Ideal Body Weight:  45 kg  BMI:  Body mass index is 16.58 kg/m.  Estimated Nutritional Needs:   Kcal:  [id number] (to prevent further weight loss)  Protein:  50-55 gr   Fluid:  1.1 liters daily  EDUCATION NEEDS:   Education needs no appropriate at this time. Please give pt copy of Alcohol Abuse and Nutrition at discharge.    Courtney Cater Courtney,RD,CSG,LDN Office: [phone] Pager: [phone]

## 2016-08-02 NOTE — Evaluation (Signed)
Physical Therapy Evaluation Patient Details Name: Courtney Thornton MRN: [id number] DOB: July 07, 1966 Today's Date: 08/02/2016   History of Present Illness  51 yof with a history of alcohol abuse, anxiety, depression, GERD, HLD, and HTN, presents to the ED with complaints of bilateral worsening foot pain and swelling. Per husband, she has had recurrent falling. While in the ED, she was noted to be hypokalemic and hyponatremic. Labs revealed alkaline phosphatase 256, albumin 2.7, AST 99, ALT 55, T. Bili. 2.8, WBC 13.2, and Hgb 9.7. Her blood alcohol level is noted to be at 34. X-ray of ankleshows diffuse soft tissue swelling about the ankle and hindfoot with ossicle adjacent to the tip of the fibula likely representative of an age-indeterminate avulsion fracture.She has been admitted for further evaluation.   Clinical Impression  Pt received in bed, and was agreeable to PT evaluation.  Pt expressed that PTA, she was independent with gait - occasionally using a cane, and she was independent with ADL's.  However, during today's evaluation, she required Mod A for supine<>sit due to posterior lean, and Max A for sit<>Stand and SPT's bed<>chair.  During this she became incontinent, and required assistance for hygiene.  Pt is not a reliable historian, and therefore, question how much she was actually mobilize at home.  She is not safe to d/c home at this point due to increased level of assistance she currently requires for basic functional mobility.  She is recommended to d/c to SNF at this time.     Follow Up Recommendations SNF    Equipment Recommendations  None recommended by PT    Recommendations for Other Services       Precautions / Restrictions Precautions Precautions: Fall Precaution Comments: Anklel brase.  Required Braces or Orthoses: Other Brace/Splint Restrictions Weight Bearing Restrictions: No (L ankle age indeterminate avulsion fx - no weightbearing status. )      Mobility  Bed Mobility Overal bed mobility: Needs Assistance Bed Mobility: Supine to Sit     Supine to sit: Mod assist;HOB elevated (poor motor planning, and need for assistance to scoot hips to the EOB.  )     General bed mobility comments: Upon entering the room, pt announced that she was urinating, however pt is not on the bedpan.  Pt assisted with hygiene, and gown change.    Transfers Overall transfer level: Needs assistance Equipment used: Rolling walker (2 wheeled) Transfers: Sit to/from [facility] Sit to Stand: Max assist (x 4 trials) Stand pivot transfers: Max assist (x1 from bed<>chair)       General transfer comment: Pt is not able to get herself into full upright position due to weakness in hip extensors, as well as back extensors. She is quite flexed fwd when performing this transfer.  During sit<>stand trials, pt became incontinent again, and needed assistance with hygiene and gown change.   Ambulation/Gait Ambulation/Gait assistance:  (NA due to poor transfers.  )              Stairs            Wheelchair Mobility    Modified Rankin (Stroke Patients Only)       Balance Overall balance assessment: Needs assistance;History of Falls Sitting-balance support: Bilateral upper extremity supported;Feet unsupported (Pt refused to get feet on the floor for balance.  ) Sitting balance-Leahy Scale: Poor     Standing balance support: Bilateral upper extremity supported Standing balance-Leahy Scale: Zero  Pertinent Vitals/Pain Pain Assessment: No/denies pain    Home Living   Living Arrangements: Spouse/significant other   Type of Home: House Home Access: Stairs to enter   [employer] of Steps: 2 Home Layout: One level Home Equipment: Cane - single point      Prior Function Level of Independence: Independent   Gait / Transfers Assistance Needed: Pt states she was independent with  ambulation, but would occasionally use her cane.  However, upon further prodding - it was discovered, that pt was not getting around very well PTA - but she is not able to tell me exactly what that means.    ADL's / Homemaking Assistance Needed: Pt states she was independent with ADL's        Hand Dominance        Extremity/Trunk Assessment   Upper Extremity Assessment: Generalized weakness           Lower Extremity Assessment: Generalized weakness      Cervical / Trunk Assessment: Kyphotic  Communication      Cognition Arousal/Alertness: Awake/alert Behavior During Therapy: WFL for tasks assessed/performed Overall Cognitive Status: Impaired/Different from baseline Area of Impairment: Following commands;Safety/judgement       Following Commands: Follows one step commands consistently (Requires increased time.  ) Safety/Judgement: Decreased awareness of safety;Decreased awareness of deficits          General Comments      Exercises     Assessment/Plan    PT Assessment Patient needs continued PT services  PT Problem List Decreased strength;Decreased activity tolerance;Decreased balance;Decreased mobility;Decreased cognition;Decreased knowledge of use of DME;Decreased safety awareness;Decreased knowledge of precautions;Pain          PT Treatment Interventions DME instruction;Gait training;Functional mobility training;Therapeutic activities;Therapeutic exercise;Balance training;Cognitive remediation;Neuromuscular re-education;Patient/family education;Wheelchair mobility training    PT Goals (Current goals can be found in the Care Plan section)  Acute Rehab PT Goals Patient Stated Goal: Pt wants to go home to have her husband assist her at home.  PT Goal Formulation: With patient Time For Goal Achievement: 08/09/16 Potential to Achieve Goals: Fair    Frequency Min 4X/week   Barriers to discharge Decreased caregiver support it is unclear if pt's husband  works during the day - different answers have been given by the patient, therefore, uncertain if [id number] supervision/assistance is available.     Co-evaluation               End of Session Equipment Utilized During Treatment: Gait belt Activity Tolerance: Patient limited by fatigue Patient left: in chair;with call bell/phone within reach;with chair alarm set      Functional Assessment Tool Used: Ivalee "6-clicks"  Functional Limitation: Mobility: Walking and moving around Mobility: Walking and Moving Around Current Status [phone]): At least 60 percent but less than 80 percent impaired, limited or restricted Mobility: Walking and Moving Around Goal Status [phone]): At least 40 percent but less than 60 percent impaired, limited or restricted    Time: 1440-1524 PT Time Calculation (min) (ACUTE ONLY): 44 min   Charges:   PT Evaluation $PT Eval Moderate Complexity: 1 Procedure PT Treatments $Neuromuscular Re-education: 8-22 mins $Self Care/Home Management: 8-22   PT G Codes:   PT G-Codes **NOT FOR INPATIENT CLASS** Functional Assessment Tool Used: [employer] "6-clicks"  Functional Limitation: Mobility: Walking and moving around Mobility: Walking and Moving Around Current Status [phone]): At least 60 percent but less than 80 percent impaired, limited or restricted Mobility: Walking and Moving Around Goal Status [phone]):  At least 40 percent but less than 60 percent impaired, limited or restricted    Beth Lashina Milles, PT, DPT X: [phone]

## 2016-08-03 LAB — BASIC METABOLIC PANEL
ANION GAP: 4 — AB (ref 5–15)
BUN: <5 mg/dL — ABNORMAL LOW (ref 6–20)
CALCIUM: 8.5 mg/dL — AB (ref 8.9–10.3)
CHLORIDE: 102 mmol/L (ref 101–111)
CO2: 25 mmol/L (ref 22–32)
Creatinine, Ser: 0.35 mg/dL — ABNORMAL LOW (ref 0.44–1.00)
GFR calc non Af Amer: >60 mL/min (ref >60)
Glucose, Bld: 94 mg/dL (ref 65–99)
Potassium: 4.2 mmol/L (ref 3.5–5.1)
SODIUM: 131 mmol/L — AB (ref 135–145)

## 2016-08-03 LAB — CBC
HCT: 28.1 % — ABNORMAL LOW (ref 36.0–46.0)
HEMOGLOBIN: 9.3 g/dL — AB (ref 12.0–15.0)
MCH: 33.3 pg (ref 26.0–34.0)
MCHC: 33.1 g/dL (ref 30.0–36.0)
MCV: 100.7 fL — ABNORMAL HIGH (ref 78.0–100.0)
Platelets: 270 10*3/uL (ref 150–400)
RBC: 2.79 MIL/uL — AB (ref 3.87–5.11)
RDW: 15.3 % (ref 11.5–15.5)
WBC: 15.2 10*3/uL — AB (ref 4.0–10.5)

## 2016-08-03 MED ORDER — SODIUM CHLORIDE 0.9 % IV SOLN
INTRAVENOUS | Status: DC
  Administered 2016-08-03: 20:00:00 via INTRAVENOUS

## 2016-08-03 NOTE — Clinical Social Work Placement (Signed)
   CLINICAL SOCIAL WORK PLACEMENT  NOTE  Date:  08/03/2016  Patient Details  Name: Courtney Thornton MRN: [id number] Date of Birth: 06-20-1966  Clinical Social Work is seeking post-discharge placement for this patient at the [place] level of care (*CSW will initial, date and re-position this form in  chart as items are completed):  Yes   Patient/family provided with [facility] Work Department's list of facilities offering this level of care within the geographic area requested by the patient (or if unable, by the patient's family).  Yes   Patient/family informed of their freedom to choose among providers that offer the needed level of care, that participate in Medicare, Medicaid or managed care program needed by the patient, have an available bed and are willing to accept the patient.  Yes   Patient/family informed of [facility]'s ownership interest in [facility] and [facility], as well as of the fact that they are under no obligation to receive care at these facilities.  PASRR submitted to EDS on 08/03/16     PASRR number received on       Existing PASRR number confirmed on       FL2 transmitted to all facilities in geographic area requested by pt/family on 08/03/16     FL2 transmitted to all facilities within larger geographic area on       Patient informed that his/her managed care company has contracts with or will negotiate with certain facilities, including the following:            Patient/family informed of bed offers received.  Patient chooses bed at       Physician recommends and patient chooses bed at      Patient to be transferred to   on  .  Patient to be transferred to facility by       Patient family notified on   of transfer.  Name of family member notified:        PHYSICIAN       Additional Comment:    _______________________________________________ Salome Arnt, [place] 08/03/2016, 1:14  PM [phone]

## 2016-08-03 NOTE — Progress Notes (Signed)
Physical Therapy Treatment Patient Details Name: Courtney Thornton MRN: [id number] DOB: April 15, 1966 Today's Date: 08/03/2016    History of Present Illness 6 yof with a history of alcohol abuse, anxiety, depression, GERD, HLD, and HTN, presents to the ED with complaints of bilateral worsening foot pain and swelling. Per husband, she has had recurrent falling. While in the ED, she was noted to be hypokalemic and hyponatremic. Labs revealed alkaline phosphatase 256, albumin 2.7, AST 99, ALT 55, T. Bili. 2.8, WBC 13.2, and Hgb 9.7. Her blood alcohol level is noted to be at 34. X-ray of ankleshows diffuse soft tissue swelling about the ankle and hindfoot with ossicle adjacent to the tip of the fibula likely representative of an age-indeterminate avulsion fracture.She has been admitted for further evaluation.     PT Comments    Pt supine in bed upon PTA entrance, pt focused on television though did state willing to participate with therapy today.  Pt required increased time for response with all questions and increased cueing to focus on task.  Min A with bed mobilty for supine to sit.  Required 2 HHA for pt to assist with posterior lean.  Max A with verbal and tactile cueing for hand placement to assist with sit to stand as well as max A for transfers to bed sit commade then chair.  Pt left in chair with call bell within reach and chair alarm set.  No reports of pain through session.  Follow Up Recommendations  SNF     Equipment Recommendations       Recommendations for Other Services       Precautions / Restrictions Precautions Precautions: Fall Precaution Comments: Anklel brase.  Required Braces or Orthoses: Other Brace/Splint Restrictions Weight Bearing Restrictions: No LLE Weight Bearing:  (L ankle age indeterminate avulsion fx - no weightbearing status)    Mobility  Bed Mobility Overal bed mobility: Needs Assistance Bed Mobility: Supine to Sit     Supine to sit: Min assist  (2 HHA required to sit by EOB to reduce posterior lean)     General bed mobility comments: Upon entering the room pt stated she needs to urinate.  Diaper applied and Max A to bedside commade.  Assised with hygiene then transfered to chair  Transfers Overall transfer level: Needs assistance Equipment used: Rolling walker (2 wheeled) Transfers: Sit to/from [facility] Sit to Stand: Max assist (1st attempt max A) Stand pivot transfers: Max assist       General transfer comment: Pt is not able to get herself into full upright position due to weakness in hip extensors, as well as back extensors. She is quite flexed fwd when performing this transfer.    Ambulation/Gait Ambulation/Gait assistance:  (NA due to max A with transfers)               Stairs            Wheelchair Mobility    Modified Rankin (Stroke Patients Only)       Balance                                    Cognition Arousal/Alertness: Awake/alert Behavior During Therapy: WFL for tasks assessed/performed Overall Cognitive Status: Impaired/Different from baseline Area of Impairment: Following commands;Safety/judgement       Following Commands: Follows one step commands consistently (Required increased time) Safety/Judgement: Decreased awareness of safety;Decreased awareness of deficits  Exercises      General Comments        Pertinent Vitals/Pain Pain Assessment: No/denies pain    Home Living                      Prior Function            PT Goals (current goals can now be found in the care plan section)      Frequency    Min 4X/week      PT Plan Current plan remains appropriate    Co-evaluation             End of Session Equipment Utilized During Treatment: Gait belt Activity Tolerance: Patient limited by fatigue Patient left: in chair;with call bell/phone within reach;with chair alarm set     Time: [id number] PT  Time Calculation (min) (ACUTE ONLY): 20 min  Charges:  $Therapeutic Activity: 8-22 mins                    G Codes:     Ihor Austin, LPTA; CBIS [phone]  Aldona Lento 08/03/2016, 10:35 AM

## 2016-08-03 NOTE — Clinical Social Work Note (Signed)
Clinical Social Work Assessment  Patient Details  Name: Courtney Thornton MRN: [id number] Date of Birth: [birth year]  Date of referral:  08/03/16               Reason for consult:  Discharge Planning                Permission sought to share information with:    Permission granted to share information::     Name::        Agency::     Relationship::     Contact Information:     Housing/Transportation Living arrangements for the past 2 months:  Single Family Home Source of Information:  Patient Patient Interpreter Needed:  None Criminal Activity/Legal Involvement Pertinent to Current Situation/Hospitalization:  No - Comment as needed Significant Relationships:  Spouse Lives with:  Spouse Do you feel safe going back to the place where you live?  Yes Need for family participation in patient care:  Yes (Comment)  Care giving concerns:  Pt is alone during the day and cannot manage at home in current condition.   Social Worker assessment / plan:  CSW met with pt at bedside. Pt alert and oriented, but speaks very softly and is hard to understand. CSW clarified all statements. Pt lives with her husband, but he works during the day. She indicates that she generally lays in bed most of the time and gets up to potty chair. PT evaluated pt and recommend SNF. CSW discussed placement process, including Medicare coverage/criteria. Pt requests [facility] or [facility] if needed. Also briefly discussed ETOH abuse. Pt admits to drinking a 40 oz beer daily. CSW attempted to further discuss this with pt, but she indicated she was not interested.   Employment status:  Disabled (Comment on whether or not currently receiving Disability) Insurance information:    PT Recommendations:  [place] / Referral to community resources:  [place]  Patient/Family's Response to care: Pt agreeable to short term rehab when medically stable.   Patient/Family's Understanding of and  Emotional Response to Diagnosis, Current Treatment, and Prognosis:  Pt aware of SNF recommendation and agrees. ETOH abuse discussed, but pt states she does not want to talk about this further.   Emotional Assessment Appearance:  Appears stated age Attitude/Demeanor/Rapport:  Other (Cooperative) Affect (typically observed):  Accepting Orientation:  Oriented to Self, Oriented to Place, Oriented to  Time, Oriented to Situation Alcohol / Substance use:  Alcohol Use Psych involvement (Current and /or in the community):  No (Comment)  Discharge Needs  Concerns to be addressed:  Discharge Planning Concerns Readmission within the last 30 days:  No Current discharge risk:  Substance Abuse Barriers to Discharge:  Continued Medical Work up   [employer], [place] 08/03/2016, 1:17 PM [phone]

## 2016-08-03 NOTE — NC FL2 (Signed)
[place] LEVEL OF CARE SCREENING TOOL     IDENTIFICATION  Patient Name: Courtney Thornton Birthdate: 1965-12-22 Sex: female Admission Date (Current Location): 07/31/2016  [facility] and [place] Number:  [employer] and Address:  [facility] [address], [place]      Provider Number: [id number]  Attending Physician Name and Address:  Kathie Dike, MD  Relative Name and Phone Number:       Current Level of Care: Hospital Recommended Level of Care: [place] Prior Approval Number:    Date Approved/Denied:   PASRR Number:    Discharge Plan: SNF    Current Diagnoses: Patient Active Problem List   Diagnosis Date Noted  . Hypokalemia 07/31/2016  . Hyponatremia 07/31/2016  . Alcohol abuse 07/31/2016  . Anemia 07/31/2016  . Diarrhea 07/31/2016  . Protein-calorie malnutrition, severe ([place]) 07/31/2016  . HTN (hypertension) 07/31/2016  . GERD (gastroesophageal reflux disease) 07/31/2016    Orientation RESPIRATION BLADDER Height & Weight     Self, Time, Situation, Place  Normal Incontinent Weight: 82 lb 1.6 oz (37.2 kg) Height:  4\' 11"  (149.9 cm)  BEHAVIORAL SYMPTOMS/MOOD NEUROLOGICAL BOWEL NUTRITION STATUS  Other (Comment) (none)  (n/a) Incontinent Diet (heart healthy)  AMBULATORY STATUS COMMUNICATION OF NEEDS Skin   Total Care Verbally Normal                       Personal Care Assistance Level of Assistance  Bathing, Feeding, Dressing Bathing Assistance: Maximum assistance Feeding assistance: Limited assistance Dressing Assistance: Maximum assistance     Functional Limitations Info  Sight, Hearing, Speech Sight Info: Adequate Hearing Info: Adequate Speech Info: Adequate    SPECIAL CARE FACTORS FREQUENCY  PT (By licensed PT)     PT Frequency: 5              Contractures Contractures Info: Not present    Additional Factors Info  Code Status, Allergies Code Status Info: Full  code Allergies Info: No known allergies           Current Medications (08/03/2016):  This is the current hospital active medication list Current Facility-Administered Medications  Medication Dose Route Frequency Provider Last Rate Last Dose  . acetaminophen (TYLENOL) tablet 650 mg  650 mg Oral Q6H PRN Kathie Dike, MD       Or  . acetaminophen (TYLENOL) suppository 650 mg  650 mg Rectal Q6H PRN Kathie Dike, MD      . enoxaparin (LOVENOX) injection 30 mg  30 mg Subcutaneous Q24H Kathie Dike, MD   30 mg at 08/02/16 2128  . feeding supplement (ENSURE ENLIVE) (ENSURE ENLIVE) liquid 237 mL  237 mL Oral BID BM Kathie Dike, MD   237 mL at 08/03/16 1130  . folic acid (FOLVITE) tablet 1 mg  1 mg Oral Daily Kathie Dike, MD   1 mg at 08/03/16 1130  . HYDROcodone-acetaminophen (NORCO/VICODIN) 5-325 MG per tablet 1-2 tablet  1-2 tablet Oral Q4H PRN Kathie Dike, MD   2 tablet at 08/03/16 0518  . loperamide (IMODIUM) capsule 2 mg  2 mg Oral TID Kathie Dike, MD   2 mg at 08/03/16 1129  . LORazepam (ATIVAN) injection 0-4 mg  0-4 mg Intravenous Q12H Kathie Dike, MD   1 mg at 08/03/16 0630  . LORazepam (ATIVAN) tablet 1 mg  1 mg Oral Q6H PRN Kathie Dike, MD   1 mg at 08/02/16 2334   Or  . LORazepam (  ATIVAN) injection 1 mg  1 mg Intravenous Q6H PRN Kathie Dike, MD      . multivitamin with minerals tablet 1 tablet  1 tablet Oral Daily Kathie Dike, MD   1 tablet at 08/03/16 1129  . ondansetron (ZOFRAN) tablet 4 mg  4 mg Oral Q6H PRN Kathie Dike, MD       Or  . ondansetron (ZOFRAN) injection 4 mg  4 mg Intravenous Q6H PRN Kathie Dike, MD   4 mg at 08/01/16 1756  . pantoprazole (PROTONIX) EC tablet 40 mg  40 mg Oral BID AC Kathie Dike, MD   40 mg at 08/03/16 1130  . sodium chloride flush (NS) 0.9 % injection 3 mL  3 mL Intravenous Q12H Kathie Dike, MD   3 mL at 08/03/16 1131  . thiamine (VITAMIN B-1) tablet 100 mg  100 mg Oral Daily Kathie Dike, MD   100 mg at  08/02/16 1225   Or  . thiamine (B-1) injection 100 mg  100 mg Intravenous Daily Kathie Dike, MD   100 mg at 08/03/16 1129     Discharge Medications: Please see discharge summary for a list of discharge medications.  Relevant Imaging Results:  Relevant Lab Results:   Additional Information SSN: [id number]  Salome Arnt, [place]

## 2016-08-03 NOTE — Progress Notes (Signed)
PROGRESS NOTE    Courtney Thornton  [id number] DOB: 1966/01/09 DOA: 07/31/2016 PCP: [facility]   Brief Narrative:  50 yof with a history of alcohol abuse, anxiety, depression, GERD, HLD, and HTN, presents to the ED with complaints of bilateral worsening foot pain and swelling. Per husband, she has had recurrent falling. While in the ED, she was noted to be hypokalemic and hyponatremic. X-ray of ankleshowed diffuse soft tissue swelling about the ankle and hindfoot with ossicle adjacent to the tip of the fibula likely representative of an age-indeterminate avulsion fracture. She was admitted and started on electrolyte replacement. She is being monitored for alcohol withdrawal. She will need SNF on discharge.  Assessment & Plan:   Active Problems:   Hypokalemia   Hyponatremia   Alcohol abuse   Anemia   Diarrhea   Protein-calorie malnutrition, severe (HCC)   HTN (hypertension)   GERD (gastroesophageal reflux disease)  1. Hypokalemia. Likely related to nutritional deficiencies, diarrhea/vomiting. Review of medication list shows that she was prescribed a thiazide. Patient reports that she's been taking this very inconsistently. Potassium and magnesium have improved with replacement. 2. Hyponatremia. Probably a combination of volume depletion as well as possible beer potomania. Improving with saline. 3. HTN. Blood pressures are on the lower side. She has been prescribed lisinopril/hydrochlorothiazide as an outpatient, but reports that she has been taking this very infrequently. Pressures are currently running low. Will continue to hold these medications. 4. GERD. Likely causing nausea and vomiting. Continue PPI and antiemetics. 5. Alcohol abuse. She is mildly tremulous at this time. Reports her last drink of alcohol was on the day of admission. Continue on CIWAprotocol with Ativan. Continue folic acid and thiamine. 6. Leg swelling. Patient had bilateral leg  swelling, worse on the left side. BNP was mildly elevated at 375. Echo showed normal EF. She does have aninjury from recent fall resulting in small avulsion fracture fibula, which could also be contributing to pain/swelling. ASOwrap was ordered by EDP. Venous dopplers negative for DVT. She may also be third spacing fluids due to hypoalbuminemia. She was given low dose lasix with improvement in edema. 7. Severe protein calorie malnutrition. Nutrition consult 8. Diarrhea. Reports to be a chronic issue. C. difficile negative. She reports still having loose stools. On imodium 9. Anemia. Anemia panel shows normal iron and elevated ferritin. Likely anemia of chronic disease. Hemoglobin remains stable. 10. Frequent falls. Physical therapy has assessed the patient and has recommended SNF. She is agreeable. 11. Lethargy. Patient noted to be lethargic today. May be medication related (ativan). Blood pressure also low and she is mildly tachycardic. May be developing some dehydration from lasix. Will discontinue further lasix and gently hydrate overnight.  DVT prophylaxis: Lovenox  Code Status: Full  Family Communication: No family bedside Disposition Plan: Discharge home once improved.    Consultants:   PT  Nutrition   Procedures:   None   Antimicrobials:  None   Subjective: Somnolent. Feels weak. Poor appetite.  Objective: Vitals:   08/02/16 1448 08/02/16 2220 08/02/16 2225 08/03/16 0519  BP: 97/65  (!) 88/60 (!) 121/100  Pulse: (!) 110  (!) 107 88  Resp: 20  18 16   Temp: 98.8 F (37.1 C)  99.1 F (37.3 C) 98.7 F (37.1 C)  TempSrc: Oral  Oral Oral  SpO2: 100% 96% 99% 100%  Weight:      Height:        Intake/Output Summary (Last 24 hours) at 08/03/16 0841 Last  data filed at 08/03/16 0630  Gross per 24 hour  Intake              546 ml  Output                0 ml  Net              546 ml   Filed Weights   07/31/16 1128 07/31/16 1530  Weight: 43.1 kg (95 lb) 37.2 kg (82  lb 1.6 oz)    Examination:  General exam: Appears calm and comfortable, somnolent, cachetic Respiratory system: Clear to auscultation. Respiratory effort normal. Cardiovascular system: S1 & S2 heard, tachycardic. No JVD, murmurs, rubs, gallops or clicks. No pedal edema. Gastrointestinal system: Abdomen is nondistended, soft and nontender. No organomegaly or masses felt. Normal bowel sounds heard. Central nervous system: somnolent. No focal neurological deficits. Extremities: Symmetric 5 x 5 power. Skin: No rashes, lesions or ulcers Psychiatry: somnolent     Data Reviewed: I have personally reviewed following labs and imaging studies  CBC:  Recent Labs Lab 07/31/16 1133 08/01/16 0710 08/02/16 0706  WBC 13.2* 11.8* 11.3*  NEUTROABS 10.0*  --   --   HGB 9.7* 8.5* 8.9*  HCT 28.5* 25.2* 27.3*  MCV 97.3 98.1 100.7*  PLT 286 265 [id number]   Basic Metabolic Panel:  Recent Labs Lab 07/31/16 1133 08/01/16 0710 08/02/16 0706  NA 130* 133* 131*  K 1.9* 3.8 5.5*  CL 90* 100* 102  CO2 24 27 26   GLUCOSE 106* 145* 104*  BUN <5* <5* <5*  CREATININE 0.46 0.30* 0.30*  CALCIUM 7.8* 7.2* 8.0*  MG 1.4* 2.0  --    GFR: Estimated Creatinine Clearance: 48.3 mL/min (by C-G formula based on SCr of 0.3 mg/dL (L)). Liver Function Tests:  Recent Labs Lab 07/31/16 1159 08/01/16 0710 08/02/16 0706  AST 99* 83* 91*  ALT 55* 43 45  ALKPHOS 256* 224* 236*  BILITOT 2.8* 1.5* 1.5*  PROT 6.6 5.1* 5.4*  ALBUMIN 2.7* 2.0* 2.1*    Recent Labs Lab 07/31/16 1159  LIPASE 14   No results for input(s): AMMONIA in the last 168 hours. Coagulation Profile:  Recent Labs Lab 07/31/16 1159  INR 1.04   Cardiac Enzymes:  Recent Labs Lab 07/31/16 1159  TROPONINI <0.03   BNP (last 3 results) No results for input(s): PROBNP in the last 8760 hours. HbA1C: No results for input(s): HGBA1C in the last 72 hours. CBG: No results for input(s): GLUCAP in the last 168 hours. Lipid Profile: No  results for input(s): CHOL, HDL, LDLCALC, TRIG, CHOLHDL, LDLDIRECT in the last 72 hours. Thyroid Function Tests:  Recent Labs  07/31/16 1958  TSH 2.186   Anemia Panel:  Recent Labs  07/31/16 1958  VITAMINB12 2,794*  FOLATE 6.8  FERRITIN 621*  TIBC NOT CALCULATED  IRON 54  RETICCTPCT 6.5*   Sepsis Labs: No results for input(s): PROCALCITON, LATICACIDVEN in the last 168 hours.  Recent Results (from the past 240 hour(s))  C difficile quick scan w PCR reflex     Status: None   Collection Time: 07/31/16  2:18 PM  Result Value Ref Range Status   C Diff antigen NEGATIVE NEGATIVE Final   C Diff toxin NEGATIVE NEGATIVE Final   C Diff interpretation No C. difficile detected.  Final         Radiology Studies: US Venous Img Lower Bilateral  Result Date: 08/01/2016 CLINICAL DATA:  50 year old female with a history of bilateral lower extremity  swelling EXAM: BILATERAL LOWER EXTREMITY VENOUS DOPPLER ULTRASOUND TECHNIQUE: Gray-scale sonography with graded compression, as well as color Doppler and duplex ultrasound were performed to evaluate the lower extremity deep venous systems from the level of the common femoral vein and including the common femoral, femoral, profunda femoral, popliteal and calf veins including the posterior tibial, peroneal and gastrocnemius veins when visible. The superficial great saphenous vein was also interrogated. Spectral Doppler was utilized to evaluate flow at rest and with distal augmentation maneuvers in the common femoral, femoral and popliteal veins. COMPARISON:  None. FINDINGS: RIGHT LOWER EXTREMITY Common Femoral Vein: No evidence of thrombus. Normal compressibility, respiratory phasicity and response to augmentation. Saphenofemoral Junction: No evidence of thrombus. Normal compressibility and flow on color Doppler imaging. Profunda Femoral Vein: No evidence of thrombus. Normal compressibility and flow on color Doppler imaging. Femoral Vein: No evidence of  thrombus. Normal compressibility, respiratory phasicity and response to augmentation. Popliteal Vein: No evidence of thrombus. Normal compressibility, respiratory phasicity and response to augmentation. Calf Veins: No evidence of thrombus. Normal compressibility and flow on color Doppler imaging. Superficial Great Saphenous Vein: No evidence of thrombus. Normal compressibility and flow on color Doppler imaging. Other Findings:  None. LEFT LOWER EXTREMITY Common Femoral Vein: No evidence of thrombus. Normal compressibility, respiratory phasicity and response to augmentation. Saphenofemoral Junction: No evidence of thrombus. Normal compressibility and flow on color Doppler imaging. Profunda Femoral Vein: No evidence of thrombus. Normal compressibility and flow on color Doppler imaging. Femoral Vein: No evidence of thrombus. Normal compressibility, respiratory phasicity and response to augmentation. Popliteal Vein: No evidence of thrombus. Normal compressibility, respiratory phasicity and response to augmentation. Calf Veins: No evidence of thrombus. Normal compressibility and flow on color Doppler imaging. Superficial Great Saphenous Vein: No evidence of thrombus. Normal compressibility and flow on color Doppler imaging. Other Findings:  None. IMPRESSION: Sonographic survey of the bilateral lower extremities negative for DVT. Signed, Dulcy Fanny. Earleen Newport, DO [facility] Electronically Signed   By: Corrie Mckusick D.O.   On: 08/01/2016 10:50        Scheduled Meds: . enoxaparin (LOVENOX) injection  30 mg Subcutaneous Q24H  . feeding supplement (ENSURE ENLIVE)  237 mL Oral BID BM  . folic acid  1 mg Oral Daily  . furosemide  20 mg Oral Daily  . loperamide  2 mg Oral TID  . LORazepam  0-4 mg Intravenous Q12H  . multivitamin with minerals  1 tablet Oral Daily  . pantoprazole  40 mg Oral BID AC  . sodium chloride flush  3 mL Intravenous Q12H  . thiamine   100 mg Oral Daily   Or  . thiamine  100 mg Intravenous Daily   Continuous Infusions:   LOS: 3 days    Time spent: 25 minutes     Kathie Dike, MD [facility] If 7PM-7AM, please contact night-coverage [email or web address] Password [facility] 08/03/2016, 8:41 AM

## 2016-08-04 LAB — BASIC METABOLIC PANEL
Anion gap: 7 (ref 5–15)
BUN: <5 mg/dL — ABNORMAL LOW (ref 6–20)
CHLORIDE: 103 mmol/L (ref 101–111)
CO2: 23 mmol/L (ref 22–32)
Calcium: 8.2 mg/dL — ABNORMAL LOW (ref 8.9–10.3)
Creatinine, Ser: <0.3 mg/dL — ABNORMAL LOW (ref 0.44–1.00)
Glucose, Bld: 97 mg/dL (ref 65–99)
POTASSIUM: 3.2 mmol/L — AB (ref 3.5–5.1)
SODIUM: 133 mmol/L — AB (ref 135–145)

## 2016-08-04 LAB — CBC
HEMATOCRIT: 29 % — AB (ref 36.0–46.0)
Hemoglobin: 9.5 g/dL — ABNORMAL LOW (ref 12.0–15.0)
MCH: 33.3 pg (ref 26.0–34.0)
MCHC: 32.8 g/dL (ref 30.0–36.0)
MCV: 101.8 fL — AB (ref 78.0–100.0)
Platelets: 285 10*3/uL (ref 150–400)
RBC: 2.85 MIL/uL — AB (ref 3.87–5.11)
RDW: 15.9 % — ABNORMAL HIGH (ref 11.5–15.5)
WBC: 12.4 10*3/uL — AB (ref 4.0–10.5)

## 2016-08-04 MED ORDER — ACETAMINOPHEN 325 MG PO TABS: 650.0000 mg | ORAL_TABLET | Freq: Four times a day (QID) | ORAL | 1 refills | Status: DC | PRN

## 2016-08-04 MED ORDER — FOLIC ACID 1 MG PO TABS: 1.0000 mg | ORAL_TABLET | Freq: Every day | ORAL | 0 refills | Status: DC

## 2016-08-04 MED ORDER — ENSURE ENLIVE PO LIQD: 237.0000 mL | Freq: Two times a day (BID) | ORAL | 12 refills | Status: DC

## 2016-08-04 NOTE — Clinical Social Work Placement (Addendum)
   CLINICAL SOCIAL WORK PLACEMENT  NOTE  Date:  08/04/2016  Patient Details  Name: Courtney Thornton MRN: [id number] Date of Birth: 1966/04/28  Clinical Social Work is seeking post-discharge placement for this patient at the [place] level of care (*CSW will initial, date and re-position this form in  chart as items are completed):  Yes   Patient/family provided with [place] Work Department's list of facilities offering this level of care within the geographic area requested by the patient (or if unable, by the patient's family).  Yes   Patient/family informed of their freedom to choose among providers that offer the needed level of care, that participate in Medicare, Medicaid or managed care program needed by the patient, have an available bed and are willing to accept the patient.  Yes   Patient/family informed of [place]'s ownership interest in [facility] and [facility], as well as of the fact that they are under no obligation to receive care at these facilities.  PASRR submitted to EDS on 08/03/16     PASRR number received on   08/04/16    Existing PASRR number confirmed on       FL2 transmitted to all facilities in geographic area requested by pt/family on 08/03/16     FL2 transmitted to all facilities within larger geographic area on       Patient informed that his/her managed care company has contracts with or will negotiate with certain facilities, including the following:        Yes   Patient/family informed of bed offers received.  Patient chooses bed at [facility]     Physician recommends and patient chooses bed at      Patient to be transferred to [facility] on 08/04/16.  Patient to be transferred to facility by facility Lucianne Lei     Patient family notified on 08/04/16 of transfer.  Name of family member notified:  Herbie [place]- spouse by voicemail     PHYSICIAN       Additional Comment:  Facility  aware of 30 day pasarr.   _______________________________________________ Salome Arnt, [place] 08/04/2016, 11:06 AM [phone]

## 2016-08-04 NOTE — Care Management Important Message (Signed)
Important Message  Patient Details  Name: Courtney Thornton MRN: [id number] Date of Birth: [birth year]   Medicare Important Message Given:  Yes    Sherald Barge, RN 08/04/2016, 9:25 AM

## 2016-08-04 NOTE — Clinical Social Work Note (Signed)
CSW went in to pt's room to share that facility could pick up pt this afternoon. She states that she has decided she is not going to [place]. Pt is aware that neither facility in [place] accepted pt. Therefore, she has decided to go home. CM, RN, and MD notified.  Benay Pike, [place]

## 2016-08-04 NOTE — NC FL2 (Signed)
Real LEVEL OF CARE SCREENING TOOL     IDENTIFICATION  Patient Name: Courtney Thornton Birthdate: 21-Oct-1965 Sex: female Admission Date (Current Location): 07/31/2016  [facility] and [place] Number:  [employer] and Address:  [place] [address] [place]      Provider Number: [id number]  Attending Physician Name and Address:  Orvan Falconer, MD  Relative Name and Phone Number:       Current Level of Care: Hospital Recommended Level of Care: [place] Prior Approval Number:    Date Approved/Denied:   PASRR Number: [id number]  Discharge Plan: SNF    Current Diagnoses: Patient Active Problem List   Diagnosis Date Noted  . Hypokalemia 07/31/2016  . Hyponatremia 07/31/2016  . Alcohol abuse 07/31/2016  . Anemia 07/31/2016  . Diarrhea 07/31/2016  . Protein-calorie malnutrition, severe ([place]) 07/31/2016  . HTN (hypertension) 07/31/2016  . GERD (gastroesophageal reflux disease) 07/31/2016    Orientation RESPIRATION BLADDER Height & Weight     Self, Time, Situation, Place  Normal Incontinent Weight: 82 lb 1.6 oz (37.2 kg) Height:  4\' 11"  (149.9 cm)  BEHAVIORAL SYMPTOMS/MOOD NEUROLOGICAL BOWEL NUTRITION STATUS  Other (Comment) (none)  (n/a) Incontinent Diet (heart healthy)  AMBULATORY STATUS COMMUNICATION OF NEEDS Skin   Total Care Verbally Normal                       Personal Care Assistance Level of Assistance  Bathing, Feeding, Dressing Bathing Assistance: Maximum assistance Feeding assistance: Limited assistance Dressing Assistance: Maximum assistance     Functional Limitations Info  Sight, Hearing, Speech Sight Info: Adequate Hearing Info: Adequate Speech Info: Adequate    SPECIAL CARE FACTORS FREQUENCY  PT (By licensed PT)     PT Frequency: 5              Contractures Contractures Info: Not present    Additional Factors Info  Code Status, Allergies Code Status Info:  Full code Allergies Info: No known allergies           Current Medications (08/04/2016):  This is the current hospital active medication list Current Facility-Administered Medications  Medication Dose Route Frequency Provider Last Rate Last Dose  . 0.9 %  sodium chloride infusion   Intravenous Continuous Kathie Dike, MD 75 mL/hr at 08/03/16 1959    . acetaminophen (TYLENOL) tablet 650 mg  650 mg Oral Q6H PRN Kathie Dike, MD       Or  . acetaminophen (TYLENOL) suppository 650 mg  650 mg Rectal Q6H PRN Kathie Dike, MD      . enoxaparin (LOVENOX) injection 30 mg  30 mg Subcutaneous Q24H Kathie Dike, MD   30 mg at 08/03/16 2204  . feeding supplement (ENSURE ENLIVE) (ENSURE ENLIVE) liquid 237 mL  237 mL Oral BID BM Kathie Dike, MD   237 mL at 08/03/16 1613  . folic acid (FOLVITE) tablet 1 mg  1 mg Oral Daily Kathie Dike, MD   1 mg at 08/03/16 1130  . HYDROcodone-acetaminophen (NORCO/VICODIN) 5-325 MG per tablet 1-2 tablet  1-2 tablet Oral Q4H PRN Kathie Dike, MD   2 tablet at 08/04/16 0003  . loperamide (IMODIUM) capsule 2 mg  2 mg Oral TID Kathie Dike, MD   2 mg at 08/03/16 2204  . LORazepam (ATIVAN) injection 0-4 mg  0-4 mg Intravenous Q12H Kathie Dike, MD   1 mg at 08/04/16 [id number]  . multivitamin with minerals tablet 1 tablet  1 tablet Oral Daily Kathie Dike, MD   1 tablet at 08/03/16 1129  . ondansetron (ZOFRAN) tablet 4 mg  4 mg Oral Q6H PRN Kathie Dike, MD       Or  . ondansetron (ZOFRAN) injection 4 mg  4 mg Intravenous Q6H PRN Kathie Dike, MD   4 mg at 08/01/16 1756  . pantoprazole (PROTONIX) EC tablet 40 mg  40 mg Oral BID AC Kathie Dike, MD   40 mg at 08/03/16 1613  . sodium chloride flush (NS) 0.9 % injection 3 mL  3 mL Intravenous Q12H Kathie Dike, MD   3 mL at 08/03/16 2206  . thiamine (VITAMIN B-1) tablet 100 mg  100 mg Oral Daily Kathie Dike, MD   100 mg at 08/02/16 1225   Or  . thiamine (B-1) injection 100 mg  100 mg Intravenous Daily  Kathie Dike, MD   100 mg at 08/03/16 1129     Discharge Medications: Please see discharge summary for a list of discharge medications.  Relevant Imaging Results:  Relevant Lab Results:   Additional Information Pasarr expires 09/03/16. SSN: [id number].  Courtney Thornton, [place]

## 2016-08-04 NOTE — Care Management (Signed)
PT recommends pt use WC and not RW. Pt agreeable. [facility] made aware of changes.

## 2016-08-04 NOTE — Clinical Social Work Note (Signed)
Per MD, pt now agreeable to SNF. CSW discussed with pt again and she continues to refuse placement outside of [place]. She is aware that neither facility in [place] can offer bed to pt. MD updated.   Benay Pike, [place]

## 2016-08-04 NOTE — Care Management (Addendum)
Pt has changed her mind and now wishes to DC home. Pt ordered RW and [place] services. She has chosen [facility] from list of DME providers. She is aware HH has 48hrs to make first visit. Blake Divine, of [facility], is aware of referral, will obtain pt info from chart and deliver DME to pt room prior to DC. Pt states her husband will come pick her up once he gets off work.

## 2016-08-04 NOTE — Discharge Summary (Signed)
Physician Discharge Summary  Courtney Thornton [id number] DOB: Aug 08, 1966 DOA: 07/31/2016  PCP: [place] date: 07/31/2016 Discharge date: 08/04/2016  Admitted From: Home.  Disposition: SNF  Recommendations for Outpatient Follow-up:  1. Follow up with PCP in 1-2 weeks 2. Please obtain BMP/CBC in one week 3. Please follow up on the following pending results:  Home Health: None.  Equipment/Devices: None.  Discharge Condition: Improved.  Able to eat.  No sign of withdrawal.  Electrolytes OK.  CODE STATUS: FULL CODE.  Diet recommendation: As tolerated, with supplements.   Brief/Interim Summary:  Patient was admitted into the hospital on Jul 31, 2016 by Dr Jolaine Artist for bilateral leg swelling.  As per his H and P:  " Courtney Thornton is a 50 y.o. female with medical history significant of alcohol abuse, anxiety, depression, GERD, HLD, and HTN, presents to the ED with complaints of bilateral worsening foot pain and swelling which onset about 1-2 weeks ago. Husband reports that she has been falling lately on a regular basis. She reports that her legs feel weak and gave out. She's not had any fever. She does have diarrhea which appears to be a chronic issue. She also reports that for the past year she's had persistent nausea and vomiting and has been unable to keep anything down. She has been losing weight. Husband reports that she used to drink between 2-3, 40 ounce beers daily, but has cut back to one 40 ounce a day. She has not had any dysuria, shortness of breath, chest pain. She has been feeling increasingly weak. By mouth intake has been poor  ED Course: While in the ED, all vital signs appear stable. Sodium 130, potassium 1.9, BUN <5, alkaline phosphatase 256, albumin 2.7, AST 99, ALT 55, T. Bili.  2.8, WBC 13.2, and Hgb 9.7. Her blood alcohol level is noted to be at 34. X-ray of ankle shows Diffuse soft tissue swelling about the ankle and hindfoot  with ossicle adjacent to the tip of the fibula likely representative of an age-indeterminate avulsion fracture. She's been referred for further treatment.  Review of Systems: As per HPI otherwise 10 point review of systems negative.   HOSPITAL COURSE:  It was felt that her leg swelling may have been from the fall, and xray showed a small avulsive Fx of the fibula.  She was placed on ASO and physical therapy was consulted and recommended SNF placement.  She had an ECHO, which showed normal EF, and doppler of her legs was negative for any DVT.  She also could be third spacing from hypoalbumin.  She was diuresed, which improved her edema. For her nutritional deficit, she was started on oral supplements.  She had chronic diarrhea, and C diff was tested, and was found to be negative.  She was originally found to be severely hypokalemia, likely from diuretics and alcohol use.  She was replaced, both Magnesium and K, and her levels were corrected.  She also had frequent fall, and PT had recommended SNF prior to consideration for going home.  She was agreeable to this.  She did have a period of lethargy, felt to be due to benzo given to prevent alcohol withdrawal, and this has resolved.  Due to her borderline hypotension, her Zesteretic was discontinued, and she will not be discharged on it.  She is now ready for discharge, and she has a bed.  She will avoid resuming alcohol, and will see her PCP in one week.  Labs should be drawn at that time.  Thank you for allowing me to participate in her care.  Good Day.   Discharge Diagnoses:  Active Problems:   Hypokalemia   Hyponatremia   Alcohol abuse   Anemia   Diarrhea   Protein-calorie malnutrition, severe (HCC)   HTN (hypertension)   GERD (gastroesophageal reflux disease)    Discharge Instructions  Discharge Instructions    Diet - low sodium heart healthy    Complete by:  As directed    Discharge instructions    Complete by:  As directed    Do not  resume drinking alcohol.  See your PCP next week.   Increase activity slowly    Complete by:  As directed        Medication List    STOP taking these medications   lisinopril-hydrochlorothiazide 20-12.5 MG tablet Commonly known as:  PRINZIDE,ZESTORETIC   loratadine 10 MG tablet Commonly known as:  CLARITIN     TAKE these medications   acetaminophen 325 MG tablet Commonly known as:  TYLENOL Take 2 tablets (650 mg total) by mouth every 6 (six) hours as needed for mild pain (or Fever >/= 101).   albuterol 108 (90 Base) MCG/ACT inhaler Commonly known as:  PROVENTIL HFA;VENTOLIN HFA Inhale 2 puffs into the lungs every 6 (six) hours as needed. For shortness of breath   feeding supplement (ENSURE ENLIVE) Liqd Take 237 mLs by mouth 2 (two) times daily between meals.   folic acid 1 MG tablet Commonly known as:  FOLVITE Take 1 tablet (1 mg total) by mouth daily.   metoprolol tartrate 25 MG tablet Commonly known as:  LOPRESSOR Take 25 mg by mouth 2 (two) times daily.   multivitamin with minerals Tabs tablet Take 1 tablet by mouth daily.   nitroGLYCERIN 0.4 MG SL tablet Commonly known as:  NITROSTAT Place 0.4 mg under the tongue every 5 (five) minutes as needed. For chest pain   ondansetron 4 MG tablet Commonly known as:  ZOFRAN Take 1 tablet (4 mg total) by mouth every 6 (six) hours. As needed for nausea or vomiting   ranitidine 150 MG tablet Commonly known as:  ZANTAC Take 150 mg by mouth daily.   sertraline 50 MG tablet Commonly known as:  ZOLOFT Take 50 mg by mouth daily as needed. depression       No Known Allergies  Consultations:  NONE.    Procedures/Studies: Dg Chest 2 View  Result Date: 07/31/2016 CLINICAL DATA:  Chest pain, vomiting for several days. EXAM: CHEST  2 VIEW COMPARISON:  11/22/2011 FINDINGS: Heart and mediastinal contours are within normal limits. No focal opacities or effusions. No acute bony abnormality. IMPRESSION: No active  cardiopulmonary disease. Electronically Signed   By: Rolm Baptise M.D.   On: 07/31/2016 12:43   Dg Ankle Complete Left  Result Date: 07/31/2016 CLINICAL DATA:  Left foot and ankle pain for the past 2 weeks. No known injury. EXAM: LEFT ANKLE COMPLETE - 3+ VIEW COMPARISON:  Left foot radiographs - earlier same day. FINDINGS: There is rather diffuse soft tissue swelling about the ankle and hindfoot, worse laterally. There is an age-indeterminate punctate (approximately 0.5 cm) ossicle adjacent to the tip of the fibular which may represent an age-indeterminate avulsion fracture. Otherwise, no fracture or dislocation. Joint spaces are preserved. Ankle mortise is preserved. No definite ankle joint effusion. No plantar calcaneal spur. IMPRESSION: Diffuse soft tissue swelling about the ankle and hindfoot with ossicle adjacent to the tip of the  fibula likely representative of an age-indeterminate avulsion fracture. Correlation for point tenderness at this location is recommended. Electronically Signed   By: Sandi Mariscal M.D.   On: 07/31/2016 12:58   US Venous Img Lower Bilateral  Result Date: 08/01/2016 CLINICAL DATA:  50 year old female with a history of bilateral lower extremity swelling EXAM: BILATERAL LOWER EXTREMITY VENOUS DOPPLER ULTRASOUND TECHNIQUE: Gray-scale sonography with graded compression, as well as color Doppler and duplex ultrasound were performed to evaluate the lower extremity deep venous systems from the level of the common femoral vein and including the common femoral, femoral, profunda femoral, popliteal and calf veins including the posterior tibial, peroneal and gastrocnemius veins when visible. The superficial great saphenous vein was also interrogated. Spectral Doppler was utilized to evaluate flow at rest and with distal augmentation maneuvers in the common femoral, femoral and popliteal veins. COMPARISON:  None. FINDINGS: RIGHT LOWER EXTREMITY Common Femoral Vein: No evidence of thrombus.  Normal compressibility, respiratory phasicity and response to augmentation. Saphenofemoral Junction: No evidence of thrombus. Normal compressibility and flow on color Doppler imaging. Profunda Femoral Vein: No evidence of thrombus. Normal compressibility and flow on color Doppler imaging. Femoral Vein: No evidence of thrombus. Normal compressibility, respiratory phasicity and response to augmentation. Popliteal Vein: No evidence of thrombus. Normal compressibility, respiratory phasicity and response to augmentation. Calf Veins: No evidence of thrombus. Normal compressibility and flow on color Doppler imaging. Superficial Great Saphenous Vein: No evidence of thrombus. Normal compressibility and flow on color Doppler imaging. Other Findings:  None. LEFT LOWER EXTREMITY Common Femoral Vein: No evidence of thrombus. Normal compressibility, respiratory phasicity and response to augmentation. Saphenofemoral Junction: No evidence of thrombus. Normal compressibility and flow on color Doppler imaging. Profunda Femoral Vein: No evidence of thrombus. Normal compressibility and flow on color Doppler imaging. Femoral Vein: No evidence of thrombus. Normal compressibility, respiratory phasicity and response to augmentation. Popliteal Vein: No evidence of thrombus. Normal compressibility, respiratory phasicity and response to augmentation. Calf Veins: No evidence of thrombus. Normal compressibility and flow on color Doppler imaging. Superficial Great Saphenous Vein: No evidence of thrombus. Normal compressibility and flow on color Doppler imaging. Other Findings:  None. IMPRESSION: Sonographic survey of the bilateral lower extremities negative for DVT. Signed, Dulcy Fanny. Earleen Newport, DO [facility] Electronically Signed   By: Corrie Mckusick D.O.   On: 08/01/2016 10:50   Dg Foot Complete Left  Result Date: 07/31/2016 CLINICAL DATA:  Bilateral lower extremity edema. Left foot  and ankle pain for the past 2 weeks. No known injury. EXAM: LEFT FOOT - COMPLETE 3+ VIEW COMPARISON:  Left ankle radiographs-earlier same day. FINDINGS: There is diffuse soft tissue swelling about the lower leg and hindfoot. No subcutaneous emphysema or radiopaque foreign body. A punctate ossicle is noted adjacent to the distal end of the fibula. Otherwise, no acute fracture or dislocation. Joint spaces are preserved. No significant hallux valgus deformity. There is rather diffuse periarticular demineralization, most conspicuously involving the proximal phalanx of the great digit as well as the distal end of the fifth metatarsal along with the distal end proximal phalanx of the fifth digit and to a lesser extent the second through fourth metatarsal heads. No definite erosions. No discrete areas of osteolysis to suggest osteomyelitis. IMPRESSION: 1. Punctate ossicle adjacent to the distal end of the fibula likely representative of age-indeterminate avulsive injury. Correlation for point tenderness at this location is recommended. 2. Diffuse soft tissue swelling about the lower leg and hindfoot. 3. Rather diffuse periarticular  demineralization, most conspicuously involving the fifth digit and proximal phalanx of the great toe, nonspecific though could be seen in the setting of a metabolic bone disease. Clinical correlation is advised. Electronically Signed   By: Sandi Mariscal M.D.   On: 07/31/2016 12:54       Subjective: I am ready to go.    Discharge Exam: Vitals:   08/04/16 0500 08/04/16 0600  BP: 104/65 94/65  Pulse: 65 91  Resp: 18   Temp: 98.7 F (37.1 C)    Vitals:   08/03/16 1758 08/04/16 0000 08/04/16 0500 08/04/16 0600  BP: (!) 87/63 102/67 104/65 94/65  Pulse: (!) 114 (!) 101 65 91  Resp: 20  18   Temp: 99.2 F (37.3 C)  98.7 F (37.1 C)   TempSrc: Oral  Oral   SpO2: 99%  100%   Weight:      Height:        General: Pt is alert, awake, not in acute distress Cardiovascular: RRR,  S1/S2 +, no rubs, no gallops Respiratory: CTA bilaterally, no wheezing, no rhonchi Abdominal: Soft, NT, ND, bowel sounds + Extremities: no edema, no cyanosis    The results of significant diagnostics from this hospitalization (including imaging, microbiology, ancillary and laboratory) are listed below for reference.     Microbiology: Recent Results (from the past 240 hour(s))  C difficile quick scan w PCR reflex     Status: None   Collection Time: 07/31/16  2:18 PM  Result Value Ref Range Status   C Diff antigen NEGATIVE NEGATIVE Final   C Diff toxin NEGATIVE NEGATIVE Final   C Diff interpretation No C. difficile detected.  Final     Labs: BNP (last 3 results)  Recent Labs  07/31/16 1958  BNP [id number]*   Basic Metabolic Panel:  Recent Labs Lab 07/31/16 1133 08/01/16 0710 08/02/16 0706 08/03/16 1019 08/04/16 0551  NA 130* 133* 131* 131* 133*  K 1.9* 3.8 5.5* 4.2 3.2*  CL 90* 100* 102 102 103  CO2 24 27 26 25 23   GLUCOSE 106* 145* 104* 94 97  BUN <5* <5* <5* <5* <5*  CREATININE 0.46 0.30* 0.30* 0.35* <0.30*  CALCIUM 7.8* 7.2* 8.0* 8.5* 8.2*  MG 1.4* 2.0  --   --   --    Liver Function Tests:  Recent Labs Lab 07/31/16 1159 08/01/16 0710 08/02/16 0706  AST 99* 83* 91*  ALT 55* 43 45  ALKPHOS 256* 224* 236*  BILITOT 2.8* 1.5* 1.5*  PROT 6.6 5.1* 5.4*  ALBUMIN 2.7* 2.0* 2.1*    Recent Labs Lab 07/31/16 1159  LIPASE 14   CBC:  Recent Labs Lab 07/31/16 1133 08/01/16 0710 08/02/16 0706 08/03/16 1019 08/04/16 0551  WBC 13.2* 11.8* 11.3* 15.2* 12.4*  NEUTROABS 10.0*  --   --   --   --   HGB 9.7* 8.5* 8.9* 9.3* 9.5*  HCT 28.5* 25.2* 27.3* 28.1* 29.0*  MCV 97.3 98.1 100.7* 100.7* 101.8*  PLT 286 265 255 270 285   Cardiac Enzymes:  Recent Labs Lab 07/31/16 1159  TROPONINI <0.03   Urinalysis    Component Value Date/Time   COLORURINE YELLOW 09/13/2008 1359   APPEARANCEUR CLEAR 09/13/2008 1359   LABSPEC 1.015 09/13/2008 1359   PHURINE 5.5  09/13/2008 1359   GLUCOSEU NEGATIVE 09/13/2008 1359   HGBUR SMALL (A) 09/13/2008 1359   BILIRUBINUR NEGATIVE 09/13/2008 1359   KETONESUR NEGATIVE 09/13/2008 1359   PROTEINUR NEGATIVE 09/13/2008 1359   UROBILINOGEN 0.2 09/13/2008 1359  NITRITE NEGATIVE 09/13/2008 1359   LEUKOCYTESUR NEGATIVE 09/13/2008 1359   Sepsis Labs Invalid input(s): PROCALCITONIN,  WBC,  LACTICIDVEN Microbiology Recent Results (from the past 240 hour(s))  C difficile quick scan w PCR reflex     Status: None   Collection Time: 07/31/16  2:18 PM  Result Value Ref Range Status   C Diff antigen NEGATIVE NEGATIVE Final   C Diff toxin NEGATIVE NEGATIVE Final   C Diff interpretation No C. difficile detected.  Final     Time coordinating discharge: Over 30 minutes  SIGNED:   Orvan Falconer, MD FACP [facility] 08/04/2016, 10:53 AM   If 7PM-7AM, please contact night-coverage [email or web address] Password [email or web address]

## 2016-08-04 NOTE — Progress Notes (Signed)
Patient was recommended to go to STR by prior physician and by physical therapy.  She has withdrawal symptoms earlier and has hx of alcohol abuse.  She fell and Fx her fibula.  She was accepted in [place].  Discharge summary and orders were done. She suddenly decided to want to go home, as she only wanted to go to STR near here. I am not certain she is competent to accept these risks, and will seek psychiatric consultation prior to discharging her home.  TTS was not able to provide that answer, just intake for inpatient Tx.   Orvan Falconer MD FACP. Hospitalist.

## 2016-08-04 NOTE — Progress Notes (Signed)
Physical Therapy Treatment Patient Details Name: Courtney Thornton MRN: [id number] DOB: 21-Oct-1965 Today's Date: 08/04/2016    History of Present Illness 1 yof with a history of alcohol abuse, anxiety, depression, GERD, HLD, and HTN, presents to the ED with complaints of bilateral worsening foot pain and swelling. Per husband, she has had recurrent falling. While in the ED, she was noted to be hypokalemic and hyponatremic. Labs revealed alkaline phosphatase 256, albumin 2.7, AST 99, ALT 55, T. Bili. 2.8, WBC 13.2, and Hgb 9.7. Her blood alcohol level is noted to be at 34. X-ray of ankleshows diffuse soft tissue swelling about the ankle and hindfoot with ossicle adjacent to the tip of the fibula likely representative of an age-indeterminate avulsion fracture.She has been admitted for further evaluation.     PT Comments    Pt received in bed, and states that she is feeling better today.  She is agreeable to PT tx.  Pt continues to require Max A for SPT's due to weakness in hip extensors and back extensor.  When she is standing she demonstrates a strong posterior lean, and is unable to shift her weight fwd to find her center of gravity over her feet.  Due to continued need for increased level of assistance, and high risk for falling, she is recommended to d/c to SNF at this time.    Follow Up Recommendations  SNF     Equipment Recommendations  None recommended by PT    Recommendations for Other Services       Precautions / Restrictions Precautions Precautions: Fall Precaution Comments: Anklel brase.  Required Braces or Orthoses: Other Brace/Splint    Mobility  Bed Mobility Overal bed mobility: Needs Assistance Bed Mobility: Supine to Sit     Supine to sit: Min assist (HOB completely raised and significant increased time to scoot hips to the EOB.  Pt needed multiple cues to get both feet flat on the floor )        Transfers Overall transfer level: Needs assistance Equipment  used: None Transfers: Stand Pivot Transfers;Sit to/from Stand Sit to Stand: Mod assist (x5 trials - pt demonstrated improved upright posture, however continues to require Mod A due to significant posterior lean and inability to get weight shifted fwd over her feet.  ) Stand pivot transfers: Max assist (bed<>BSC, and then BSC<>chair)          Ambulation/Gait Ambulation/Gait assistance:  (NA - continued poor ability to transfer or obtain full upright posture, or weight shift)               Stairs            Wheelchair Mobility    Modified Rankin (Stroke Patients Only)       Balance   Sitting-balance support: Bilateral upper extremity supported;Feet supported Sitting balance-Leahy Scale: Poor   Postural control: Posterior lean Standing balance support: Bilateral upper extremity supported Standing balance-Leahy Scale: Zero Standing balance comment: Strong posterior lean with back of LE's being supported by the chair.                      Cognition Arousal/Alertness: Awake/alert Behavior During Therapy: Flat affect Overall Cognitive Status: Impaired/Different from baseline Area of Impairment: Following commands;Safety/judgement       Following Commands: Follows one step commands consistently Safety/Judgement: Decreased awareness of safety;Decreased awareness of deficits          Exercises      General Comments  Pertinent Vitals/Pain Pain Assessment: No/denies pain    Home Living                      Prior Function            PT Goals (current goals can now be found in the care plan section) Acute Rehab PT Goals Patient Stated Goal: Pt wants to go home to have her husband assist her at home.  PT Goal Formulation: With patient Time For Goal Achievement: 08/09/16 Potential to Achieve Goals: Fair Progress towards PT goals: Progressing toward goals    Frequency    Min 4X/week      PT Plan Current plan remains  appropriate    Co-evaluation             End of Session Equipment Utilized During Treatment: Gait belt Activity Tolerance: Patient limited by fatigue Patient left: in chair;with call bell/phone within reach;with chair alarm set     Time: [id number] PT Time Calculation (min) (ACUTE ONLY): 35 min  Charges:  $Therapeutic Activity: 8-22 mins $Self Care/Home Management: 8-22                    G Codes:      Beth Zuri [place], PT, DPT X: [phone]

## 2016-08-05 ENCOUNTER — Encounter: Payer: Self-pay | Admitting: Emergency Medicine

## 2016-08-05 ENCOUNTER — Emergency Department: Payer: Medicare Other

## 2016-08-05 ENCOUNTER — Emergency Department
Admission: EM | Admit: 2016-08-05 | Discharge: 2016-08-05 | Disposition: A | Discharge status: Home / Self Care | Payer: Medicare Other | Attending: Emergency Medicine | Admitting: Emergency Medicine

## 2016-08-05 DIAGNOSIS — Z5321 Procedure and treatment not carried out due to patient leaving prior to being seen by health care provider: Secondary | ICD-10-CM | POA: Insufficient documentation

## 2016-08-05 DIAGNOSIS — F17210 Nicotine dependence, cigarettes, uncomplicated: Secondary | ICD-10-CM | POA: Insufficient documentation

## 2016-08-05 DIAGNOSIS — I10 Essential (primary) hypertension: Secondary | ICD-10-CM | POA: Insufficient documentation

## 2016-08-05 DIAGNOSIS — M25552 Pain in left hip: Secondary | ICD-10-CM | POA: Insufficient documentation

## 2016-08-05 DIAGNOSIS — M25572 Pain in left ankle and joints of left foot: Secondary | ICD-10-CM | POA: Insufficient documentation

## 2016-08-05 DIAGNOSIS — W19XXXA Unspecified fall, initial encounter: Secondary | ICD-10-CM | POA: Insufficient documentation

## 2016-08-05 DIAGNOSIS — Y929 Unspecified place or not applicable: Secondary | ICD-10-CM | POA: Insufficient documentation

## 2016-08-05 DIAGNOSIS — Z859 Personal history of malignant neoplasm, unspecified: Secondary | ICD-10-CM | POA: Insufficient documentation

## 2016-08-05 DIAGNOSIS — Y999 Unspecified external cause status: Secondary | ICD-10-CM | POA: Insufficient documentation

## 2016-08-05 DIAGNOSIS — Y939 Activity, unspecified: Secondary | ICD-10-CM | POA: Insufficient documentation

## 2016-08-05 NOTE — Progress Notes (Signed)
Patient and her husband now changed their minds again, and would like to go home. I spoke with her husband, to see if he needs more time to contemplate prior to making a final decision, but he is sure they would like to go home. They expressed full understanding that she is at risk for falling, and relapsing, and they are willing to proceed with the inherent risks.  He said " if she breaks her neck, then she breaks her neck"   "this is what she wants to do". Will discharge her to her home and follow up with her PCP next week.   Orvan Falconer MD [facility].

## 2016-08-05 NOTE — Progress Notes (Signed)
Patient discharged with instructions, prescription, and care notes.  Verbalized understanding via teach back.  IV was removed and the site was WNL. Patient voiced no further complaints or concerns at the time of discharge.  Appointments scheduled per instructions.  Patient left the floor via w/c family  And staff in stable condition.  Dr. Marin Comment spoke with the patient and family in regard to d/c plan.  Orders given to d/c home and was followed.

## 2016-08-05 NOTE — ED Triage Notes (Addendum)
Pt presents to ED from home via [facility], with c/o left hip pain radiating down leg and ankle, also c/o coccyx pain x 2 weeks. Pt states fell 2 weeks ago. Pt was seen at [facility] today but states hip or ankle was not xray. Pt reports is suppose to use cane but states "it makes me fall so I don't use it."

## 2016-08-05 NOTE — Clinical Social Work Note (Signed)
CSW received call stating that pt is now agreeable to rehab at [facility]. CSW spoke with pt and pt's husband who confirm this. Per admissions, facility can accept pt today and according to MD, no changes to d/c summary from yesterday. Family to provide transport.  Benay Pike, [place]

## 2016-08-05 NOTE — Discharge Summary (Signed)
After speaking with her husband and her daughter, they were able to convince her to accept the STR, and nothing is changed. She will be going to the STR as planned.  Thanks,  Orvan Falconer  MD FACP. Hospitalist.

## 2016-08-05 NOTE — ED Triage Notes (Signed)
Per EMS report, patient was discharged from [facility] today to the [facility] in [place]. Patient went home and then called EMS. EMS was unsure of the complaint per to the patient's irritation.

## 2016-08-06 ENCOUNTER — Inpatient Hospital Stay (HOSPITAL_COMMUNITY)
Admission: EM | Admit: 2016-08-06 | Discharge: 2016-08-10 | DRG: 640 | Disposition: A | Discharge status: Skilled Nursing Facility | Payer: Medicare Other | Attending: Internal Medicine | Admitting: Internal Medicine

## 2016-08-06 ENCOUNTER — Encounter (HOSPITAL_COMMUNITY): Payer: Self-pay

## 2016-08-06 ENCOUNTER — Emergency Department (HOSPITAL_COMMUNITY): Payer: Medicare Other

## 2016-08-06 DIAGNOSIS — M7989 Other specified soft tissue disorders: Secondary | ICD-10-CM

## 2016-08-06 DIAGNOSIS — R7989 Other specified abnormal findings of blood chemistry: Secondary | ICD-10-CM

## 2016-08-06 DIAGNOSIS — R197 Diarrhea, unspecified: Secondary | ICD-10-CM | POA: Diagnosis not present

## 2016-08-06 DIAGNOSIS — R51 Headache: Secondary | ICD-10-CM | POA: Diagnosis present

## 2016-08-06 DIAGNOSIS — R519 Headache, unspecified: Secondary | ICD-10-CM

## 2016-08-06 DIAGNOSIS — F17210 Nicotine dependence, cigarettes, uncomplicated: Secondary | ICD-10-CM | POA: Diagnosis present

## 2016-08-06 DIAGNOSIS — E8809 Other disorders of plasma-protein metabolism, not elsewhere classified: Secondary | ICD-10-CM

## 2016-08-06 DIAGNOSIS — D72829 Elevated white blood cell count, unspecified: Secondary | ICD-10-CM | POA: Diagnosis present

## 2016-08-06 DIAGNOSIS — K219 Gastro-esophageal reflux disease without esophagitis: Secondary | ICD-10-CM | POA: Diagnosis not present

## 2016-08-06 DIAGNOSIS — D649 Anemia, unspecified: Secondary | ICD-10-CM | POA: Diagnosis not present

## 2016-08-06 DIAGNOSIS — M25472 Effusion, left ankle: Secondary | ICD-10-CM | POA: Diagnosis not present

## 2016-08-06 DIAGNOSIS — F1010 Alcohol abuse, uncomplicated: Secondary | ICD-10-CM | POA: Diagnosis not present

## 2016-08-06 DIAGNOSIS — Z87442 Personal history of urinary calculi: Secondary | ICD-10-CM

## 2016-08-06 DIAGNOSIS — E43 Unspecified severe protein-calorie malnutrition: Secondary | ICD-10-CM | POA: Diagnosis present

## 2016-08-06 DIAGNOSIS — R109 Unspecified abdominal pain: Secondary | ICD-10-CM

## 2016-08-06 DIAGNOSIS — R945 Abnormal results of liver function studies: Secondary | ICD-10-CM

## 2016-08-06 DIAGNOSIS — I10 Essential (primary) hypertension: Secondary | ICD-10-CM | POA: Diagnosis present

## 2016-08-06 DIAGNOSIS — K7290 Hepatic failure, unspecified without coma: Secondary | ICD-10-CM | POA: Diagnosis present

## 2016-08-06 DIAGNOSIS — Z90710 Acquired absence of both cervix and uterus: Secondary | ICD-10-CM

## 2016-08-06 DIAGNOSIS — M79672 Pain in left foot: Secondary | ICD-10-CM | POA: Diagnosis present

## 2016-08-06 DIAGNOSIS — Z681 Body mass index (BMI) 19 or less, adult: Secondary | ICD-10-CM

## 2016-08-06 DIAGNOSIS — N390 Urinary tract infection, site not specified: Secondary | ICD-10-CM | POA: Diagnosis present

## 2016-08-06 DIAGNOSIS — E876 Hypokalemia: Principal | ICD-10-CM | POA: Diagnosis present

## 2016-08-06 DIAGNOSIS — Z8673 Personal history of transient ischemic attack (TIA), and cerebral infarction without residual deficits: Secondary | ICD-10-CM

## 2016-08-06 DIAGNOSIS — M6281 Muscle weakness (generalized): Secondary | ICD-10-CM

## 2016-08-06 DIAGNOSIS — F419 Anxiety disorder, unspecified: Secondary | ICD-10-CM | POA: Diagnosis present

## 2016-08-06 DIAGNOSIS — E785 Hyperlipidemia, unspecified: Secondary | ICD-10-CM | POA: Diagnosis present

## 2016-08-06 DIAGNOSIS — R627 Adult failure to thrive: Secondary | ICD-10-CM | POA: Diagnosis present

## 2016-08-06 DIAGNOSIS — D72828 Other elevated white blood cell count: Secondary | ICD-10-CM

## 2016-08-06 DIAGNOSIS — E871 Hypo-osmolality and hyponatremia: Secondary | ICD-10-CM | POA: Diagnosis present

## 2016-08-06 DIAGNOSIS — K7460 Unspecified cirrhosis of liver: Secondary | ICD-10-CM | POA: Diagnosis present

## 2016-08-06 LAB — CBC WITH DIFFERENTIAL/PLATELET
BASOS ABS: 0.2 10*3/uL — AB (ref 0.0–0.1)
BASOS PCT: 1 %
Eosinophils Absolute: 0.2 10*3/uL (ref 0.0–0.7)
Eosinophils Relative: 1 %
HCT: 27.4 % — ABNORMAL LOW (ref 36.0–46.0)
HEMOGLOBIN: 9.3 g/dL — AB (ref 12.0–15.0)
LYMPHS PCT: 17 %
Lymphs Abs: 2.8 10*3/uL (ref 0.7–4.0)
MCH: 33 pg (ref 26.0–34.0)
MCHC: 33.9 g/dL (ref 30.0–36.0)
MCV: 97.2 fL (ref 78.0–100.0)
MONOS PCT: 9 %
Monocytes Absolute: 1.5 10*3/uL — ABNORMAL HIGH (ref 0.1–1.0)
NEUTROS ABS: 11.5 10*3/uL — AB (ref 1.7–7.7)
Neutrophils Relative %: 72 %
Platelets: 339 10*3/uL (ref 150–400)
RBC: 2.82 MIL/uL — ABNORMAL LOW (ref 3.87–5.11)
RDW: 16.9 % — ABNORMAL HIGH (ref 11.5–15.5)
WBC: 16.2 10*3/uL — ABNORMAL HIGH (ref 4.0–10.5)

## 2016-08-06 LAB — URINE MICROSCOPIC-ADD ON

## 2016-08-06 LAB — CBC
HEMATOCRIT: 28.1 % — AB (ref 36.0–46.0)
Hemoglobin: 9.3 g/dL — ABNORMAL LOW (ref 12.0–15.0)
MCH: 32.4 pg (ref 26.0–34.0)
MCHC: 33.1 g/dL (ref 30.0–36.0)
MCV: 97.9 fL (ref 78.0–100.0)
Platelets: 387 10*3/uL (ref 150–400)
RBC: 2.87 MIL/uL — ABNORMAL LOW (ref 3.87–5.11)
RDW: 16.7 % — AB (ref 11.5–15.5)
WBC: 18.3 10*3/uL — ABNORMAL HIGH (ref 4.0–10.5)

## 2016-08-06 LAB — HEPATIC FUNCTION PANEL
ALT: 37 U/L (ref 14–54)
AST: 61 U/L — AB (ref 15–41)
Albumin: 2 g/dL — ABNORMAL LOW (ref 3.5–5.0)
Alkaline Phosphatase: 258 U/L — ABNORMAL HIGH (ref 38–126)
Bilirubin, Direct: 0.7 mg/dL — ABNORMAL HIGH (ref 0.1–0.5)
Indirect Bilirubin: 0.6 mg/dL (ref 0.3–0.9)
TOTAL PROTEIN: 5.3 g/dL — AB (ref 6.5–8.1)
Total Bilirubin: 1.3 mg/dL — ABNORMAL HIGH (ref 0.3–1.2)

## 2016-08-06 LAB — BASIC METABOLIC PANEL
Anion gap: 10 (ref 5–15)
CHLORIDE: 102 mmol/L (ref 101–111)
CO2: 23 mmol/L (ref 22–32)
Calcium: 8.5 mg/dL — ABNORMAL LOW (ref 8.9–10.3)
Creatinine, Ser: 0.44 mg/dL (ref 0.44–1.00)
GFR calc Af Amer: >60 mL/min (ref >60)
GFR calc non Af Amer: >60 mL/min (ref >60)
GLUCOSE: 118 mg/dL — AB (ref 65–99)
POTASSIUM: 2.8 mmol/L — AB (ref 3.5–5.1)
Sodium: 135 mmol/L (ref 135–145)

## 2016-08-06 LAB — URINALYSIS, ROUTINE W REFLEX MICROSCOPIC
Glucose, UA: NEGATIVE mg/dL
HGB URINE DIPSTICK: NEGATIVE
Ketones, ur: 15 mg/dL — AB
Nitrite: POSITIVE — AB
Protein, ur: NEGATIVE mg/dL
SPECIFIC GRAVITY, URINE: 1.02 (ref 1.005–1.030)
pH: 6 (ref 5.0–8.0)

## 2016-08-06 LAB — PHOSPHORUS: Phosphorus: 1.2 mg/dL — ABNORMAL LOW (ref 2.5–4.6)

## 2016-08-06 LAB — MAGNESIUM: MAGNESIUM: 2.2 mg/dL (ref 1.7–2.4)

## 2016-08-06 MED ORDER — ONDANSETRON HCL 4 MG/2ML IJ SOLN
4.0000 mg | Freq: Four times a day (QID) | INTRAMUSCULAR | Status: DC | PRN
  Administered 2016-08-07: 4 mg via INTRAVENOUS
  Filled 2016-08-06: qty 2

## 2016-08-06 MED ORDER — ALBUTEROL SULFATE (2.5 MG/3ML) 0.083% IN NEBU: 2.5000 mg | INHALATION_SOLUTION | Freq: Four times a day (QID) | RESPIRATORY_TRACT | Status: DC | PRN

## 2016-08-06 MED ORDER — POTASSIUM CHLORIDE 2 MEQ/ML IV SOLN
30.0000 meq | Freq: Once | INTRAVENOUS | Status: AC
  Administered 2016-08-06: 30 meq via INTRAVENOUS
  Filled 2016-08-06: qty 15

## 2016-08-06 MED ORDER — SODIUM CHLORIDE 0.9% FLUSH
3.0000 mL | Freq: Two times a day (BID) | INTRAVENOUS | Status: DC
  Administered 2016-08-06 – 2016-08-07 (×2): 3 mL via INTRAVENOUS

## 2016-08-06 MED ORDER — SERTRALINE HCL 50 MG PO TABS
50.0000 mg | ORAL_TABLET | Freq: Every day | ORAL | Status: DC
  Administered 2016-08-07 – 2016-08-10 (×4): 50 mg via ORAL
  Filled 2016-08-06 (×4): qty 1

## 2016-08-06 MED ORDER — HYDROCODONE-ACETAMINOPHEN 5-325 MG PO TABS: 1.0000 | ORAL_TABLET | ORAL | Status: DC | PRN

## 2016-08-06 MED ORDER — VITAMIN B-1 100 MG PO TABS
100.0000 mg | ORAL_TABLET | Freq: Every day | ORAL | Status: DC
  Administered 2016-08-07: 100 mg via ORAL
  Filled 2016-08-06: qty 1

## 2016-08-06 MED ORDER — MAGNESIUM SULFATE 2 GM/50ML IV SOLN
2.0000 g | Freq: Once | INTRAVENOUS | Status: AC
  Administered 2016-08-06: 2 g via INTRAVENOUS
  Filled 2016-08-06: qty 50

## 2016-08-06 MED ORDER — ACETAMINOPHEN 650 MG RE SUPP: 650.0000 mg | Freq: Four times a day (QID) | RECTAL | Status: DC | PRN

## 2016-08-06 MED ORDER — SODIUM CHLORIDE 0.9% FLUSH: 3.0000 mL | INTRAVENOUS | Status: DC | PRN

## 2016-08-06 MED ORDER — ACETAMINOPHEN 325 MG PO TABS
650.0000 mg | ORAL_TABLET | Freq: Four times a day (QID) | ORAL | Status: DC | PRN
Start: 2016-08-06 — End: 2016-08-10

## 2016-08-06 MED ORDER — POTASSIUM CHLORIDE CRYS ER 20 MEQ PO TBCR
40.0000 meq | EXTENDED_RELEASE_TABLET | Freq: Once | ORAL | Status: AC
  Administered 2016-08-07: 40 meq via ORAL
  Filled 2016-08-06: qty 2

## 2016-08-06 MED ORDER — SODIUM CHLORIDE 0.9 % IV SOLN: 250.0000 mL | INTRAVENOUS | Status: DC | PRN

## 2016-08-06 MED ORDER — ENSURE ENLIVE PO LIQD
237.0000 mL | Freq: Two times a day (BID) | ORAL | Status: DC
  Administered 2016-08-07 – 2016-08-08 (×2): 237 mL via ORAL

## 2016-08-06 MED ORDER — SODIUM CHLORIDE 0.9% FLUSH
3.0000 mL | Freq: Two times a day (BID) | INTRAVENOUS | Status: DC
  Administered 2016-08-06 – 2016-08-10 (×5): 3 mL via INTRAVENOUS

## 2016-08-06 MED ORDER — ONDANSETRON HCL 4 MG PO TABS: 4.0000 mg | ORAL_TABLET | Freq: Four times a day (QID) | ORAL | Status: DC | PRN

## 2016-08-06 NOTE — ED Provider Notes (Signed)
[facility] DEPT Provider Note   CSN: [id number] Arrival date & time: 08/06/16  1514     History   Chief Complaint Chief Complaint  Patient presents with  . Foot Swelling    HPI Courtney Thornton is a 50 y.o. female.  The history is provided by the patient, the spouse and medical records. No language interpreter was used.    Patient is a 50 year old female with PMH alcohol abuse, HTN, HLD, who presents complaining of bilateral foot and ankle swelling and pain in her legs for past 3 weeks. Of note, patient was recently admitted to OSH and was discharged yesterday. At that time she was treated for severe hypokalemia, hypomagnesemia. Bilateral lower extremity Dopplers were negative for DVT. Patient had hypoalbuminemia and swelling was felt to be related to third spacing. In addition she had suffered a fall prior to admission and was found to have left fibular fracture. She was recommended to be discharged to a SNF but declined when it was too far from her home. After less than 24 hours at home, patient felt her symptoms had worsened. Family felt they're unable to take care of her and presented again to the emergency department for evaluation. Patient currently complains of pain and swelling in her bilateral lower extremities. She has chronic diarrhea as well as intermittent nausea and vomiting. She has poor by mouth intake and has been losing weight. Of note, patient has a history of alcohol abuse but both patient and spouse denies drinking any alcohol for the past week. Denies seizures, shakes, or tremors. Denies fevers, chills, cough w/ sputum production.   Past Medical History:  Diagnosis Date  . Anxiety    takes Xanax daily  . Arthritis    shoulders  . Blood transfusion 2004  . Bronchitis    hx of-couple of years ago  . Bruises easily    platelets are always high  . Cancer (HCC)    cervical  . Confusion   . Depression    takes Zoloft nightly  . Diarrhea   . Dizziness    hx of  . Emphysema   . Gastric ulcer   . GERD (gastroesophageal reflux disease)    takes Omeprazole and Ranitidine daily  . Headache(784.0)   . History of kidney stones 2000  . Hyperlipidemia    doesn't take any meds at present time  . Hypertension    takes Metoprolol and Lisinopril occ  . Insomnia    xanax and pain pills help  . Joint pain   . Joint swelling   . Migraines    last on e3/10/13  . Neck pain    stenosis and radiculopathy  . PONV (postoperative nausea and vomiting)   . Shortness of breath    with exertion  . Stroke [facility])    TIA-22 yrs ago    Patient Active Problem List   Diagnosis Date Noted  . Leukocytosis 08/06/2016  . Hypokalemia 07/31/2016  . Hyponatremia 07/31/2016  . Alcohol abuse 07/31/2016  . Anemia 07/31/2016  . Diarrhea 07/31/2016  . Protein-calorie malnutrition, severe ([place]) 07/31/2016  . HTN (hypertension) 07/31/2016  . GERD (gastroesophageal reflux disease) 07/31/2016    Past Surgical History:  Procedure Laterality Date  . ABDOMINAL HYSTERECTOMY  2004   partial d/t fibroid tumors  . ANTERIOR CERVICAL DECOMP/DISCECTOMY FUSION  11/24/2011   Procedure: ANTERIOR CERVICAL DECOMPRESSION/DISCECTOMY FUSION 3 LEVELS;  Surgeon: Hosie Spangle, MD;  Location: [facility];  Service: Neurosurgery;  Laterality: N/A;  Cervical three-four, four-five,five-six anterior cervical decompression with fusion plating and bonegraft  . BREAST SURGERY  2002   breast reduction  . CERVICAL CONE BIOPSY  20+yrs ago   d/t cervical cancer  . ECTOPIC PREGNANCY SURGERY  1987/1989  . right hand  90's   cyst removed from top of hand    OB History    No data available       Home Medications    Prior to Admission medications   Medication Sig Start Date End Date Taking? Authorizing Provider  acetaminophen (TYLENOL) 325 MG tablet Take 2 tablets (650 mg total) by mouth every 6 (six) hours as needed for mild pain (or Fever >/= 101). 08/04/16  Yes Orvan Falconer, MD    albuterol (PROVENTIL HFA;VENTOLIN HFA) 108 (90 BASE) MCG/ACT inhaler Inhale 2 puffs into the lungs every 6 (six) hours as needed. For shortness of breath   Yes Historical Provider, MD  feeding supplement, ENSURE ENLIVE, (ENSURE ENLIVE) LIQD Take 237 mLs by mouth 2 (two) times daily between meals. 08/04/16  Yes Orvan Falconer, MD  ibuprofen (ADVIL,MOTRIN) 400 MG tablet Take 400 mg by mouth every 6 (six) hours as needed for headache (or pain).   Yes Historical Provider, MD  metoprolol tartrate (LOPRESSOR) 25 MG tablet Take 25 mg by mouth 2 (two) times daily as needed (high blood pressure).    Yes Historical Provider, MD  Multiple Vitamin (MULITIVITAMIN WITH MINERALS) TABS Take 1 tablet by mouth daily.   Yes Historical Provider, MD  nitroGLYCERIN (NITROSTAT) 0.4 MG SL tablet Place 0.4 mg under the tongue every 5 (five) minutes as needed. For chest pain   Yes Historical Provider, MD  ondansetron (ZOFRAN) 4 MG tablet Take 1 tablet (4 mg total) by mouth every 6 (six) hours. As needed for nausea or vomiting 04/29/16  Yes Tammy Triplett, PA-C  ranitidine (ZANTAC) 150 MG tablet Take 150 mg by mouth daily as needed for heartburn.    Yes Historical Provider, MD  sertraline (ZOLOFT) 50 MG tablet Take 50 mg by mouth daily as needed. depression   Yes Historical Provider, MD  folic acid (FOLVITE) 1 MG tablet Take 1 tablet (1 mg total) by mouth daily. Patient not taking: Reported on 08/06/2016 08/04/16   Orvan Falconer, MD    Family History Family History  Problem Relation Age of Onset  . Anesthesia problems Neg Hx   . Hypotension Neg Hx   . Malignant hyperthermia Neg Hx   . Pseudochol deficiency Neg Hx     Social History Social History  Substance Use Topics  . Smoking status: Current Every Day Smoker    Packs/day: 1.00    Years: 32.00    Types: Cigarettes  . Smokeless tobacco: Never Used  . Alcohol use No     Comment: 1.5 40oz a day     Allergies   Patient has no known allergies.   Review of  Systems Review of Systems  Constitutional: Positive for fatigue. Negative for chills and fever.  HENT: Negative for ear pain and sore throat.   Eyes: Negative for pain and visual disturbance.  Respiratory: Negative for cough and shortness of breath.   Cardiovascular: Positive for leg swelling. Negative for chest pain and palpitations.  Gastrointestinal: Negative for abdominal pain and vomiting.  Genitourinary: Negative for dysuria and hematuria.  Musculoskeletal: Positive for joint swelling. Negative for back pain.  Skin: Negative for color change and rash.  Neurological: Negative for seizures and syncope.  All other systems reviewed and are  negative.    Physical Exam Updated Vital Signs BP 120/82 (BP Location: Left Arm)   Pulse 93   Temp 99.5 F (37.5 C) (Oral)   Resp 16   Ht 4\' 11"  (1.499 m)   Wt 38.1 kg   SpO2 100%   BMI 16.96 kg/m   Physical Exam  Constitutional: No distress.  Very thin, AAF, appears chronically ill  HENT:  Head: Normocephalic and atraumatic.  Mucus membranes dry. Poor dentition.  Eyes: Conjunctivae are normal.  Neck: Neck supple.  Cardiovascular: Regular rhythm.  Tachycardia present.   No murmur heard. Pulmonary/Chest: Effort normal and breath sounds normal. No respiratory distress.  Abdominal: Soft. There is no tenderness.  Musculoskeletal:  Swelling about bilateral feet and ankles, left greater than right. Mild tenderness to palpation over left ankle. No calf swelling or tenderness.  Neurological: She is alert.  Skin: Skin is warm and dry.  Psychiatric: She has a normal mood and affect.  Nursing note and vitals reviewed.    ED Treatments / Results  Labs (all labs ordered are listed, but only abnormal results are displayed) Labs Reviewed  BASIC METABOLIC PANEL - Abnormal; Notable for the following:       Result Value   Potassium 2.8 (*)    Glucose, Bld 118 (*)    BUN <5 (*)    Calcium 8.5 (*)    All other components within normal  limits  CBC - Abnormal; Notable for the following:    WBC 18.3 (*)    RBC 2.87 (*)    Hemoglobin 9.3 (*)    HCT 28.1 (*)    RDW 16.7 (*)    All other components within normal limits  PHOSPHORUS - Abnormal; Notable for the following:    Phosphorus 1.2 (*)    All other components within normal limits  HEPATIC FUNCTION PANEL - Abnormal; Notable for the following:    Total Protein 5.3 (*)    Albumin 2.0 (*)    AST 61 (*)    Alkaline Phosphatase 258 (*)    Total Bilirubin 1.3 (*)    Bilirubin, Direct 0.7 (*)    All other components within normal limits  CBC WITH DIFFERENTIAL/PLATELET - Abnormal; Notable for the following:    WBC 16.2 (*)    RBC 2.82 (*)    Hemoglobin 9.3 (*)    HCT 27.4 (*)    RDW 16.9 (*)    Neutro Abs 11.5 (*)    Monocytes Absolute 1.5 (*)    Basophils Absolute 0.2 (*)    All other components within normal limits  URINALYSIS, ROUTINE W REFLEX MICROSCOPIC (NOT AT [facility]) - Abnormal; Notable for the following:    Color, Urine AMBER (*)    APPearance CLOUDY (*)    Bilirubin Urine MODERATE (*)    Ketones, ur 15 (*)    Nitrite POSITIVE (*)    Leukocytes, UA SMALL (*)    All other components within normal limits  URINE MICROSCOPIC-ADD ON - Abnormal; Notable for the following:    Squamous Epithelial / LPF 0-5 (*)    Bacteria, UA FEW (*)    Casts HYALINE CASTS (*)    All other components within normal limits  MAGNESIUM  MAGNESIUM  PHOSPHORUS  TSH  COMPREHENSIVE METABOLIC PANEL  CBC  PROTIME-INR    EKG  EKG Interpretation  Date/Time:  Friday August 06 2016 19:34:36 EST Ventricular Rate:  92 PR Interval:    QRS Duration: 129 QT Interval:  356 QTC Calculation: 441  R Axis:   44 Text Interpretation:  Sinus tachycardia electical noisce Nonspecific intraventricular conduction delay ST depression, consider ischemia, lateral lds  Artifact in lead(s) I II III aVR aVL aVF V1 V2 V3 V4 V5 V6 Since last tracing background noise present Confirmed by Sabra Heck  MD,  BRIAN ([id number]) on 08/06/2016 7:43:06 PM       Radiology  Procedures Procedures (including critical care time)  Medications Ordered in ED Medications  sertraline (ZOLOFT) tablet 50 mg (not administered)  albuterol (PROVENTIL) (2.5 MG/3ML) 0.083% nebulizer solution 2.5 mg (not administered)  feeding supplement (ENSURE ENLIVE) (ENSURE ENLIVE) liquid 237 mL (not administered)  sodium chloride flush (NS) 0.9 % injection 3 mL (3 mLs Intravenous Given 08/06/16 2345)  acetaminophen (TYLENOL) tablet 650 mg (not administered)    Or  acetaminophen (TYLENOL) suppository 650 mg (not administered)  HYDROcodone-acetaminophen (NORCO/VICODIN) 5-325 MG per tablet 1-2 tablet (not administered)  ondansetron (ZOFRAN) tablet 4 mg (not administered)    Or  ondansetron (ZOFRAN) injection 4 mg (not administered)  sodium chloride flush (NS) 0.9 % injection 3 mL (3 mLs Intravenous Given 08/06/16 2345)  sodium chloride flush (NS) 0.9 % injection 3 mL (not administered)  0.9 %  sodium chloride infusion (not administered)  thiamine (VITAMIN B-1) tablet 100 mg (not administered)  potassium chloride 30 mEq in sodium chloride 0.9 % 265 mL (KCL MULTIRUN) IVPB (30 mEq Intravenous Given 08/06/16 2010)  magnesium sulfate IVPB 2 g 50 mL (0 g Intravenous Stopped 08/06/16 2307)  potassium chloride SA (K-DUR,KLOR-CON) CR tablet 40 mEq (40 mEq Oral Given 08/07/16 0055)     Initial Impression / Assessment and Plan / ED Course  I have reviewed the triage vital signs and the nursing notes.  Pertinent labs & imaging results that were available during my care of the patient were reviewed by me and considered in my medical decision making (see chart for details).  Clinical Course     Patient is a 50 year old female with past medical history significant for alcohol abuse, chronic diarrhea, hypokalemia who presents with bilateral foot and ankle swelling and pain. Of note patient has recent admission for the same, as outlined  above. At that time hypokalemia and hypomagnesemia were repleted and patient was recommended for discharge to SNF for management of FTT, but declined. Afebrile, mildly tachycardic with otherwise normal vital signs at presentation. Exam as above, significant for bilateral foot and ankle swelling left greater than right. Patient appears cachectic and chronically ill. EKG shows sinus tachycardia without acute ischemic changes. Labs with hypokalemia at 2.8. Patient also has leukocytosis of unclear etiology. Potassium and magnesium were repleted.  Patient likely with low albumin due to cirrhosis and poor nutrition which is continuing to her lower extremity swelling. She has recent negative DVT studies. No new injuries or falls. Patient was discussed with case management for possible placement. Advised admission to hospital for observation, electrolyte replacement and will work with social work on placement near patient's home tomorrow. Hospitalist was consulted for admission. Added on hepatic function panel, magnesium, UA and chest x-ray per hospitalist request. Will be admitted to floor for further observation and management of failure to thrive.  Patient was discussed with Dr. Sabra Heck, ED attending.  Final Clinical Impressions(s) / ED Diagnoses   Final diagnoses:  Foot swelling  Hypoalbuminemia  FTT (failure to thrive) in adult    New Prescriptions Current Discharge Medication List       Gibson Ramp, MD 08/07/16 [id number]    Noemi Chapel, MD  08/07/16 1441  

## 2016-08-06 NOTE — ED Triage Notes (Addendum)
Pt here for feet swelling. She reports she was discharged from the hospital yesterday. She reports she is dehydrated. She also states she is in the process of detoxing from alcohol. Pt is very frail and thin. Swelling to the left foot is more significant. She also states she has broken bones in her left ankle and foot.

## 2016-08-06 NOTE — ED Provider Notes (Signed)
I saw and evaluated the patient, reviewed the resident's note and I agree with the findings and plan.  Pertinent History: The patient is a 50 year old female, she is chronically malnourished and is a known alcoholic who last stopped drinking 7 days ago, she was admitted to the hospital for hypokalemia with a very low potassium of 1.9. The patient was supposed to be placed in a skilled nursing facility but refused to go to the facility that was arranged, she has been at home since yesterday, she is complaining of increasing ankle swelling since that time. She does have chronic foot and ankle swelling. I suspect this is related to her chronic hypoalbuminemia secondary to her liver failure secondary to her alcohol abuse. The significant other states that she is so weak she has a hard time even standing up and going to the commode, she has a very poor appetite and has not been taking very much but he is forcing her to drink protein shakes. The patient denies any fevers chills nausea or vomiting but states that she is chronically cold, she is very weak, she denies shortness of breath or chest pain.  Pertinent Exam findings: On exam the patient does have bilateral 2+ pitting edema of the feet, otherwise her lower extremities appear to have some muscle wasting and she does appear generally cachectic. Heart and lung exams are unremarkable, abdomen is soft and nontender, mental status is normal, there is no obvious tremor, she is not hallucinating or delirious or delusional.  I discussed the care with the patient and the family at the bedside. We will ask our case manager to evaluate the patient for possible placement in a different facility however at this time there is no indication to admit the patient to the hospital. Her potassium today is higher than it was at 2.8, she has not yet filled the medications which she was given at discharge. She'll also get magnesium.   EKG Interpretation  Date/Time:  Friday  August 06 2016 19:34:36 EST Ventricular Rate:  92 PR Interval:    QRS Duration: 129 QT Interval:  356 QTC Calculation: 441 R Axis:   44 Text Interpretation:  Sinus tachycardia electical noisce Nonspecific intraventricular conduction delay ST depression, consider ischemia, lateral lds  Artifact in lead(s) I II III aVR aVL aVF V1 V2 V3 V4 V5 V6 Since last tracing background noise present Confirmed by Michel Hendon  MD, Richards Pherigo ([id number]) on 08/06/2016 7:43:06 PM        I personally interpreted the EKG as well as the resident and agree with the interpretation on the resident's chart.  Final diagnoses:  Foot swelling  Hypoalbuminemia  FTT (failure to thrive) in adult      Noemi Chapel, MD 08/07/16 1441

## 2016-08-06 NOTE — H&P (Signed)
Courtney Thornton [id number] DOB: [birth year] DOA: 08/06/2016     PCP: [facility]   Outpatient Specialists: none  Patient coming from:   home Lives  With family    Chief Complaint: hurting all over  HPI: Courtney Thornton is a 50 y.o. female with medical history significant of alcohol abuse, anxiety, depression, GERD, HLD, and HTN,     Presented with complaints of persistent diarrhea which is chronic and pain over diffuse weakness all over  Patient has been recently admitted to [facility] they have worsening bilateral foot pain and swelling she has chronic diarrhea with chronic hypokalemia. Patient was recommended to be discharged to [facility] but declined she went home instead. After 24 hours at home with family felt that though unable to take care of her and she came back for [place] to [place] [facility] and then now to [employer]. Case management has been consulted and currently working on admission to nursing home facility. During her hospitalization she was found to have small avulsive fracture of the fibula likely explaining leg swelling. ASO was placed in physical therapy was done echogram was done showing normal EF Dopplers were negative for DVT she has low albumin and swelling felt to be likely related to third spacing. She was diuresed and has improved. She's been having persistent nausea and vomiting that's unchanged has been losing weight. Patient used to be heavy alcohol abuser but states she quits 1 week ago. Patient does not endorse any nausea or vomiting currently no shortness of breath or chest pain. And persistent diarrhea she was tested for C. difficile analysis negative at the time of her discharge from any pain in both her magnesium and potassium has been replaced with a blood pressure medications were held secondary to hypotension.     IN ER:  Temp (24hrs), Avg:98.5 F (36.9 C), Min:98.5 F (36.9 C), Max:98.5 F (36.9 C)      RR 13 HR 99 BP  122/81 K 2.8 WBC 18 up from 12.4 Hg 9.3 at baseline CXR - non acute Following Medications were ordered in ER: Medications  potassium chloride 30 mEq in sodium chloride 0.9 % 265 mL (KCL MULTIRUN) IVPB (30 mEq Intravenous Given 08/06/16 2010)  magnesium sulfate IVPB 2 g 50 mL (2 g Intravenous New Bag/Given 08/06/16 2055)     ER provider discussed case with: case Management  Hospitalist was called for admission for hypokalemia  Review of Systems:    Pertinent positives include: chills, fatigue,    weight loss Bilateral lower extremity swelling    Constitutional:  No weight loss, night sweats, Fevers, HEENT:  No headaches, Difficulty swallowing,Tooth/dental problems,Sore throat,  No sneezing, itching, ear ache, nasal congestion, post nasal drip,  Cardio-vascular:  No chest pain, Orthopnea, PND, anasarca, dizziness, palpitations.no GI:  No heartburn, indigestion, abdominal pain, nausea, vomiting, diarrhea, change in bowel habits, loss of appetite, melena, blood in stool, hematemesis Resp:  no shortness of breath at rest. No dyspnea on exertion, No excess mucus, no productive cough, No non-productive cough, No coughing up of blood.No change in color of mucus.No wheezing. Skin:  no rash or lesions. No jaundice GU:  no dysuria, change in color of urine, no urgency or frequency. No straining to urinate.  No flank pain.  Musculoskeletal:  No joint pain or no joint swelling. No decreased range of motion. No back pain.  Psych:  No change in mood or affect. No depression or anxiety. No memory loss.  Neuro: no localizing neurological complaints, no tingling, no weakness, no double vision, no gait abnormality, no slurred speech, no confusion  As per HPI otherwise 10 point review of systems negative.   Past Medical History: Past Medical History:  Diagnosis Date  . Anxiety    takes Xanax daily  . Arthritis    shoulders  . Blood transfusion 2004  . Bronchitis    hx of-couple of  years ago  . Bruises easily    platelets are always high  . Cancer (HCC)    cervical  . Confusion   . Depression    takes Zoloft nightly  . Diarrhea   . Dizziness    hx of  . Emphysema   . Gastric ulcer   . GERD (gastroesophageal reflux disease)    takes Omeprazole and Ranitidine daily  . Headache(784.0)   . History of kidney stones 2000  . Hyperlipidemia    doesn't take any meds at present time  . Hypertension    takes Metoprolol and Lisinopril occ  . Insomnia    xanax and pain pills help  . Joint pain   . Joint swelling   . Migraines    last on e3/10/13  . Neck pain    stenosis and radiculopathy  . PONV (postoperative nausea and vomiting)   . Shortness of breath    with exertion  . Stroke [facility])    TIA-22 yrs ago   Past Surgical History:  Procedure Laterality Date  . ABDOMINAL HYSTERECTOMY  2004   partial d/t fibroid tumors  . ANTERIOR CERVICAL DECOMP/DISCECTOMY FUSION  11/24/2011   Procedure: ANTERIOR CERVICAL DECOMPRESSION/DISCECTOMY FUSION 3 LEVELS;  Surgeon: Hosie Spangle, MD;  Location: [place] NEURO ORS;  Service: Neurosurgery;  Laterality: N/A;  Cervical three-four, four-five,five-six anterior cervical decompression with fusion plating and bonegraft  . BREAST SURGERY  2002   breast reduction  . CERVICAL CONE BIOPSY  20+yrs ago   d/t cervical cancer  . ECTOPIC PREGNANCY SURGERY  1987/1989  . right hand  90's   cyst removed from top of hand     Social History:       reports that she has been smoking Cigarettes.  She has a 32.00 pack-year smoking history. She has never used smokeless tobacco. She reports that she does not drink alcohol or use drugs.  Allergies:  No Known Allergies     Family History:   Family History  Problem Relation Age of Onset  . Anesthesia problems Neg Hx   . Hypotension Neg Hx   . Malignant hyperthermia Neg Hx   . Pseudochol deficiency Neg Hx     Medications: Prior to Admission medications   Medication Sig Start Date  End Date Taking? Authorizing Provider  acetaminophen (TYLENOL) 325 MG tablet Take 2 tablets (650 mg total) by mouth every 6 (six) hours as needed for mild pain (or Fever >/= 101). 08/04/16   Orvan Falconer, MD  albuterol (PROVENTIL HFA;VENTOLIN HFA) 108 (90 BASE) MCG/ACT inhaler Inhale 2 puffs into the lungs every 6 (six) hours as needed. For shortness of breath    Historical Provider, MD  feeding supplement, ENSURE ENLIVE, (ENSURE ENLIVE) LIQD Take 237 mLs by mouth 2 (two) times daily between meals. 08/04/16   Orvan Falconer, MD  folic acid (FOLVITE) 1 MG tablet Take 1 tablet (1 mg total) by mouth daily. 08/04/16   Orvan Falconer, MD  metoprolol tartrate (LOPRESSOR) 25 MG tablet Take 25 mg by mouth 2 (two) times daily.  Historical Provider, MD  Multiple Vitamin (MULITIVITAMIN WITH MINERALS) TABS Take 1 tablet by mouth daily.    Historical Provider, MD  nitroGLYCERIN (NITROSTAT) 0.4 MG SL tablet Place 0.4 mg under the tongue every 5 (five) minutes as needed. For chest pain    Historical Provider, MD  ondansetron (ZOFRAN) 4 MG tablet Take 1 tablet (4 mg total) by mouth every 6 (six) hours. As needed for nausea or vomiting 04/29/16   Tammy Triplett, PA-C  ranitidine (ZANTAC) 150 MG tablet Take 150 mg by mouth daily.    Historical Provider, MD  sertraline (ZOLOFT) 50 MG tablet Take 50 mg by mouth daily as needed. depression    Historical Provider, MD    Physical Exam: Patient Vitals for the past 24 hrs:  BP Temp Temp src Pulse Resp SpO2 Height Weight  08/06/16 2145 118/87 - - 99 16 99 % - -  08/06/16 2115 120/83 - - 98 15 100 % - -  08/06/16 2030 130/92 - - - 12 - - -  08/06/16 1829 130/85 - - 109 16 98 % - -  08/06/16 1700 112/78 - - 98 16 100 % - -  08/06/16 1526 - - - - - - 4\' 11"  (1.499 m) 38.6 kg (85 lb)  08/06/16 1525 101/77 98.5 F (36.9 C) Oral 108 16 100 % - -    1. General:  in No Acute distress, thin 2. Psychological: Alert and  Oriented 3. Head/ENT:     Dry Mucous Membranes                           Head Non traumatic, neck supple                            Poor Dentition 4. SKIN:  decreased Skin turgor,  Skin clean Dry and intact no rash 5. Heart: Regular rate and rhythm no   Murmur, Rub or gallop 6. Lungs:  Clear to auscultation bilaterally, no wheezes or crackles   7. Abdomen: Soft,  non-tender, Non distended 8. Lower extremities: no clubbing, cyanosis,  bilateral edema 9. Neurologically Grossly intact, moving all 4 extremities equally 10. MSK: Normal range of motion   body mass index is 17.17 kg/m.  Labs on Admission:   Labs on Admission: I have personally reviewed following labs and imaging studies  CBC:  Recent Labs Lab 07/31/16 1133 08/01/16 0710 08/02/16 0706 08/03/16 1019 08/04/16 0551 08/06/16 1535  WBC 13.2* 11.8* 11.3* 15.2* 12.4* 18.3*  NEUTROABS 10.0*  --   --   --   --   --   HGB 9.7* 8.5* 8.9* 9.3* 9.5* 9.3*  HCT 28.5* 25.2* 27.3* 28.1* 29.0* 28.1*  MCV 97.3 98.1 100.7* 100.7* 101.8* 97.9  PLT 286 265 255 270 285 [id number]   Basic Metabolic Panel:  Recent Labs Lab 07/31/16 1133 08/01/16 0710 08/02/16 0706 08/03/16 1019 08/04/16 0551 08/06/16 1535  NA 130* 133* 131* 131* 133* 135  K 1.9* 3.8 5.5* 4.2 3.2* 2.8*  CL 90* 100* 102 102 103 102  CO2 24 27 26 25 23 23   GLUCOSE 106* 145* 104* 94 97 118*  BUN <5* <5* <5* <5* <5* <5*  CREATININE 0.46 0.30* 0.30* 0.35* <0.30* 0.44  CALCIUM 7.8* 7.2* 8.0* 8.5* 8.2* 8.5*  MG 1.4* 2.0  --   --   --   --    GFR: Estimated Creatinine Clearance: 50.1 mL/min (  by C-G formula based on SCr of 0.44 mg/dL). Liver Function Tests:  Recent Labs Lab 07/31/16 1159 08/01/16 0710 08/02/16 0706  AST 99* 83* 91*  ALT 55* 43 45  ALKPHOS 256* 224* 236*  BILITOT 2.8* 1.5* 1.5*  PROT 6.6 5.1* 5.4*  ALBUMIN 2.7* 2.0* 2.1*    Recent Labs Lab 07/31/16 1159  LIPASE 14   No results for input(s): AMMONIA in the last 168 hours. Coagulation Profile:  Recent Labs Lab 07/31/16 1159  INR 1.04   Cardiac  Enzymes:  Recent Labs Lab 07/31/16 1159  TROPONINI <0.03   BNP (last 3 results) No results for input(s): PROBNP in the last 8760 hours. HbA1C: No results for input(s): HGBA1C in the last 72 hours. CBG: No results for input(s): GLUCAP in the last 168 hours. Lipid Profile: No results for input(s): CHOL, HDL, LDLCALC, TRIG, CHOLHDL, LDLDIRECT in the last 72 hours. Thyroid Function Tests: No results for input(s): TSH, T4TOTAL, FREET4, T3FREE, THYROIDAB in the last 72 hours. Anemia Panel: No results for input(s): VITAMINB12, FOLATE, FERRITIN, TIBC, IRON, RETICCTPCT in the last 72 hours.  Sepsis Labs: @LABRCNTIP (procalcitonin:4,lacticidven:4) ) Recent Results (from the past 240 hour(s))  C difficile quick scan w PCR reflex     Status: None   Collection Time: 07/31/16  2:18 PM  Result Value Ref Range Status   C Diff antigen NEGATIVE NEGATIVE Final   C Diff toxin NEGATIVE NEGATIVE Final   C Diff interpretation No C. difficile detected.  Final      UA  ordered  No results found for: HGBA1C  Estimated Creatinine Clearance: 50.1 mL/min (by C-G formula based on SCr of 0.44 mg/dL).  BNP (last 3 results) No results for input(s): PROBNP in the last 8760 hours.   ECG REPORT  Independently reviewed Rate:92  Rhythm: NSR ST&T Change: No acute ischemic changes   QTC 441  Filed Weights   08/06/16 1526  Weight: 38.6 kg (85 lb)     Cultures: No results found for: SDES, SPECREQUEST, CULT, REPTSTATUS   Radiological Exams on Admission: Dg Chest 2 View  Result Date: 08/06/2016 CLINICAL DATA:  Acute onset of leukocytosis.  Initial encounter. EXAM: CHEST  2 VIEW COMPARISON:  Chest radiograph performed 07/31/2016 FINDINGS: The lungs are well-aerated. Minimal right midlung and left basilar opacities likely reflect atelectasis. There is no evidence of pleural effusion or pneumothorax. The heart is normal in size; the mediastinal contour is within normal limits. No acute osseous  abnormalities are seen. Cervical fusion hardware is partially imaged. IMPRESSION: Minimal right midlung and left basilar opacities likely reflect atelectasis. Electronically Signed   By: Garald Balding M.D.   On: 08/06/2016 22:36   Dg Foot Complete Left  Result Date: 08/05/2016 CLINICAL DATA:  Left foot pain EXAM: LEFT FOOT - COMPLETE 3+ VIEW COMPARISON:  05/28/2014 FINDINGS: No fracture or subluxation is evident. Old fracture deformity involving the proximal fourth proximal phalanx. There are patchy areas of osteopenia involving the distal fifth metatarsal and proximal phalanx with periarticular osteopenia of the first through fifth MTP joints. IMPRESSION: 1. No definite acute displaced fracture or malalignment 2. Patchy areas of osteopenia, involving the digits of the left foot, findings could be secondary to disuse osteopenia, infection, or early manifestation of inflammatory arthropathy. Clinical correlation is recommended. Electronically Signed   By: Donavan Foil M.D.   On: 08/05/2016 20:51   Dg Hip Unilat With Pelvis 2-3 Views Left  Result Date: 08/05/2016 CLINICAL DATA:  Left hip pain EXAM: DG HIP (WITH  OR WITHOUT PELVIS) 2-3V LEFT COMPARISON:  04/29/2016 FINDINGS: There is no evidence of hip fracture or dislocation. There is no evidence of arthropathy or other focal bone abnormality. IMPRESSION: Negative. Electronically Signed   By: Donavan Foil M.D.   On: 08/05/2016 20:52    Chart has been reviewed    Assessment/Plan  50 y.o. female with medical history significant of alcohol abuse, anxiety, depression, GERD, HLD, and HTN being admitted for hypokalemia  Present on Admission: . Alcohol abuse currently in remission . Anemia stable and at baseline . GERD (gastroesophageal reflux disease) able continue home medications at discharge . HTN (hypertension) somewhat soft blood pressures will continue to hold blood pressure medications from home . Hypokalemia we'll replace check magnesium  level . Protein-calorie malnutrition, severe ([place]) continue ensure . Diarrhea chronic patient have had a workup in the past unsure of etiology supportive management for the Leukocytosis - unclear T allergy no evidence of infectious process at this point chest x-ray unremarkable we'll order UA patient has occasional chills but no fever and is been persistently elevated will order CBC with differential  Other plan as per orders.  DVT prophylaxis:  SCD     Code Status:  FULL CODE as per patient    Family Communication:   Family  at  Bedside  plan of care was discussed with  Husband,   Disposition Plan:    likely will need placement for rehabilitation [place] management has been consulted plan for patient to be placed dyspnea in the a.m.                                                                      Consults called: none   Admission status:  obs   Level of care    tele      I have spent a total of 56 min on this admission    Erland Vivas 08/06/2016, 11:17 PM    [facility]  Pager [phone]   after 2 AM please page floor coverage PA If 7AM-7PM, please contact the day team taking care of the patient  [email or web address]  Password [email or web address]

## 2016-08-06 NOTE — Care Management (Signed)
Patient was discharged home, after declining SNF placement to the [facility]. CM noted several notes concerning patient agreeing then deciding against going to [facility].   Patient has had 2 ED visits since d/c'd yesterday. CM met with patient and husband at bedside. Patient and husband reports they decline placement at the [facility] due to the 1 hour drive, and it is difficult for family to visit. Decided on discharge home but unable to manage at home.  Patient presented this evening with complaints of leg swelling. ED eval results hypokalemic K+2.8 , WBC 18.3 up from 12.4  11/22.   CM contacted covering CSW at [facility] to follow up with this patient and family.

## 2016-08-06 NOTE — ED Notes (Signed)
Transported to [facility] in stable condition . 

## 2016-08-07 ENCOUNTER — Observation Stay (HOSPITAL_COMMUNITY): Payer: Medicare Other

## 2016-08-07 DIAGNOSIS — Z87442 Personal history of urinary calculi: Secondary | ICD-10-CM | POA: Diagnosis not present

## 2016-08-07 DIAGNOSIS — R51 Headache: Secondary | ICD-10-CM | POA: Diagnosis present

## 2016-08-07 DIAGNOSIS — K7460 Unspecified cirrhosis of liver: Secondary | ICD-10-CM | POA: Diagnosis present

## 2016-08-07 DIAGNOSIS — F1010 Alcohol abuse, uncomplicated: Secondary | ICD-10-CM | POA: Diagnosis present

## 2016-08-07 DIAGNOSIS — F17210 Nicotine dependence, cigarettes, uncomplicated: Secondary | ICD-10-CM | POA: Diagnosis present

## 2016-08-07 DIAGNOSIS — E876 Hypokalemia: Secondary | ICD-10-CM | POA: Diagnosis present

## 2016-08-07 DIAGNOSIS — Z681 Body mass index (BMI) 19 or less, adult: Secondary | ICD-10-CM | POA: Diagnosis not present

## 2016-08-07 DIAGNOSIS — E871 Hypo-osmolality and hyponatremia: Secondary | ICD-10-CM | POA: Diagnosis present

## 2016-08-07 DIAGNOSIS — M25472 Effusion, left ankle: Secondary | ICD-10-CM | POA: Diagnosis present

## 2016-08-07 DIAGNOSIS — E43 Unspecified severe protein-calorie malnutrition: Secondary | ICD-10-CM | POA: Diagnosis present

## 2016-08-07 DIAGNOSIS — K7290 Hepatic failure, unspecified without coma: Secondary | ICD-10-CM | POA: Diagnosis present

## 2016-08-07 DIAGNOSIS — E785 Hyperlipidemia, unspecified: Secondary | ICD-10-CM | POA: Diagnosis present

## 2016-08-07 DIAGNOSIS — D649 Anemia, unspecified: Secondary | ICD-10-CM | POA: Diagnosis present

## 2016-08-07 DIAGNOSIS — N390 Urinary tract infection, site not specified: Secondary | ICD-10-CM | POA: Diagnosis present

## 2016-08-07 DIAGNOSIS — K219 Gastro-esophageal reflux disease without esophagitis: Secondary | ICD-10-CM | POA: Diagnosis present

## 2016-08-07 DIAGNOSIS — R627 Adult failure to thrive: Secondary | ICD-10-CM | POA: Diagnosis present

## 2016-08-07 DIAGNOSIS — F419 Anxiety disorder, unspecified: Secondary | ICD-10-CM | POA: Diagnosis present

## 2016-08-07 DIAGNOSIS — Z8673 Personal history of transient ischemic attack (TIA), and cerebral infarction without residual deficits: Secondary | ICD-10-CM | POA: Diagnosis not present

## 2016-08-07 DIAGNOSIS — Z90710 Acquired absence of both cervix and uterus: Secondary | ICD-10-CM | POA: Diagnosis not present

## 2016-08-07 DIAGNOSIS — R197 Diarrhea, unspecified: Secondary | ICD-10-CM | POA: Diagnosis not present

## 2016-08-07 DIAGNOSIS — M79672 Pain in left foot: Secondary | ICD-10-CM | POA: Diagnosis present

## 2016-08-07 DIAGNOSIS — I10 Essential (primary) hypertension: Secondary | ICD-10-CM | POA: Diagnosis present

## 2016-08-07 LAB — COMPREHENSIVE METABOLIC PANEL
ALK PHOS: 274 U/L — AB (ref 38–126)
ALT: 36 U/L (ref 14–54)
ANION GAP: 9 (ref 5–15)
AST: 68 U/L — ABNORMAL HIGH (ref 15–41)
Albumin: 2.1 g/dL — ABNORMAL LOW (ref 3.5–5.0)
BILIRUBIN TOTAL: 1.1 mg/dL (ref 0.3–1.2)
BUN: <5 mg/dL — ABNORMAL LOW (ref 6–20)
CALCIUM: 8.1 mg/dL — AB (ref 8.9–10.3)
CO2: 24 mmol/L (ref 22–32)
Chloride: 106 mmol/L (ref 101–111)
Creatinine, Ser: 0.4 mg/dL — ABNORMAL LOW (ref 0.44–1.00)
Glucose, Bld: 98 mg/dL (ref 65–99)
POTASSIUM: 3.3 mmol/L — AB (ref 3.5–5.1)
Sodium: 139 mmol/L (ref 135–145)
TOTAL PROTEIN: 5.6 g/dL — AB (ref 6.5–8.1)

## 2016-08-07 LAB — PROTIME-INR
INR: 1.12
PROTHROMBIN TIME: 14.5 s (ref 11.4–15.2)

## 2016-08-07 LAB — CBC
HEMATOCRIT: 29.3 % — AB (ref 36.0–46.0)
HEMOGLOBIN: 9.8 g/dL — AB (ref 12.0–15.0)
MCH: 32.7 pg (ref 26.0–34.0)
MCHC: 33.4 g/dL (ref 30.0–36.0)
MCV: 97.7 fL (ref 78.0–100.0)
Platelets: 405 10*3/uL — ABNORMAL HIGH (ref 150–400)
RBC: 3 MIL/uL — AB (ref 3.87–5.11)
RDW: 17.1 % — ABNORMAL HIGH (ref 11.5–15.5)
WBC: 15.5 10*3/uL — AB (ref 4.0–10.5)

## 2016-08-07 LAB — PHOSPHORUS: PHOSPHORUS: 1 mg/dL — AB (ref 2.5–4.6)

## 2016-08-07 LAB — URIC ACID: URIC ACID, SERUM: 4.1 mg/dL (ref 2.3–6.6)

## 2016-08-07 LAB — MAGNESIUM: MAGNESIUM: 1.9 mg/dL (ref 1.7–2.4)

## 2016-08-07 LAB — AMMONIA: AMMONIA: 63 umol/L — AB (ref 9–35)

## 2016-08-07 LAB — TSH: TSH: 4.729 u[IU]/mL — ABNORMAL HIGH (ref 0.350–4.500)

## 2016-08-07 MED ORDER — K PHOS MONO-SOD PHOS DI & MONO 155-852-130 MG PO TABS
500.0000 mg | ORAL_TABLET | Freq: Three times a day (TID) | ORAL | Status: DC
  Administered 2016-08-07 – 2016-08-10 (×11): 500 mg via ORAL
  Filled 2016-08-07 (×12): qty 2

## 2016-08-07 MED ORDER — DEXTROSE 5 % IV SOLN
1.0000 g | INTRAVENOUS | Status: DC
  Administered 2016-08-07 – 2016-08-10 (×4): 1 g via INTRAVENOUS
  Filled 2016-08-07 (×4): qty 10

## 2016-08-07 MED ORDER — LACTULOSE 10 GM/15ML PO SOLN
10.0000 g | Freq: Two times a day (BID) | ORAL | Status: DC
  Administered 2016-08-07 – 2016-08-08 (×3): 10 g via ORAL
  Filled 2016-08-07 (×3): qty 15

## 2016-08-07 NOTE — Progress Notes (Addendum)
CSW notified by [facility], pt ready for [place] called [facility], pt's bed available.  CSW met with pt and spouse @ bedside. Pt is ok with going to [facility], Spouse reports it's 50 miles from their home and request facility in [place] which is where he works. CSW faxed out to [facility] facilities and spoke to [facility] about possible acceptance today. CSW will continue to monitor status to see if pt can transfer today.  CSW received call from [facility], [facility], pt has not met 3 night stay.  CSW will remain available to pt/family for support as needed.  Edana Aguado B. Joline Maxcy Clinical Social Work Dept Weekend Social Worker [phone] 4:39 PM

## 2016-08-07 NOTE — Progress Notes (Addendum)
CRITICAL VALUE ALERT  Critical value received:  Phosphorus 1.0  Date of notification:  08/07/16  Time of notification:  07:38  Critical value read back: yes  Nurse who received alert: Wells Guiles  MD notified (1st page): Dr. [place] Pink  Time of first page:  08:30  MD notified (2nd page):  Time of second page:  Responding MD:    Time MD responded:

## 2016-08-07 NOTE — Progress Notes (Addendum)
Initial Nutrition Assessment  DOCUMENTATION CODES:  Underweight, Severe malnutrition in context of chronic illness   INTERVENTION:  Continue Ensure Enlive po BID, each supplement provides 350 kcal and 20 grams of protein  NUTRITION DIAGNOSIS:  Malnutrition related to poor appetite and ETOH abuse as evidenced by severe depletion of muscle mass, severe depletion of body fat.  GOAL:  Patient will meet greater than or equal to 90% of their needs  MONITOR:  PO intake, Supplement acceptance, Labs, I & O's  REASON FOR ASSESSMENT:  Malnutrition Screening Tool    ASSESSMENT:  50 y/o female PMhx Etoh abuse, anxiety, depression, GERD, HLD and HTN. Presented with persistent diarrhea and generalized pain/weakness. Found to have fibula fracture. Admitted for hypokalemia  Pt is a poor historian. She says her appetite varies day to day. Sometimes she eats well, other times she does not. She has vitamins, but "I only take my meds when I feel like it", which includes the vitamins. She says she is 2 weeks sober. She could not really report what foods she ate. She mentioned candy bars and other miscellaneous snack foods.   She reports diarrhea, but this this is new as opposed to chronic. She has ocasional nausea  She could not give a UBW and says it varies with how she ate that day. She believes she is heavier when she eats junk food that day. Per chart review, she appears to have maintained her weight recently.   RD asked what he could do to help her eat better and she replied "giving me something to give me an appetite". She declined any food requests stating that her husband would bring her something. Was agreeable to continuing the Ensures.   NFPE: Severe Muscle and Fat wasting  Labs: Elevated liver enzymes, Albumin:2.1, Phos: 1.0, Mag: 1.9, K: 3.3, WBC:15.5  Medications: Lactulose, Ensure Enlive, IV abx, thiamin,  phosphorus,    Recent Labs Lab 08/01/16 0710  08/04/16 0551 08/06/16 1535  08/06/16 2249 08/07/16 0605  NA 133*  < > 133* 135  --  139  K 3.8  < > 3.2* 2.8*  --  3.3*  CL 100*  < > 103 102  --  106  CO2 27  < > 23 23  --  24  BUN <5*  < > <5* <5*  --  <5*  CREATININE 0.30*  < > <0.30* 0.44  --  0.40*  CALCIUM 7.2*  < > 8.2* 8.5*  --  8.1*  MG 2.0  --   --   --  2.2 1.9  PHOS  --   --   --   --  1.2* 1.0*  GLUCOSE 145*  < > 97 118*  --  98  < > = values in this interval not displayed.   Diet Order:  Diet Heart Room service appropriate? Yes; Fluid consistency: Thin  Skin:     Last BM:     Height:  Ht Readings from Last 1 Encounters:  08/06/16 4\' 11"  (1.499 m)   Weight:  Wt Readings from Last 1 Encounters:  08/07/16 83 lb 15.9 oz (38.1 kg)   Wt Readings from Last 10 Encounters:  08/07/16 83 lb 15.9 oz (38.1 kg)  08/05/16 85 lb (38.6 kg)  07/31/16 82 lb 1.6 oz (37.2 kg)  04/29/16 81 lb 3.2 oz (36.8 kg)  05/28/14 105 lb (47.6 kg)  01/09/14 111 lb (50.3 kg)  11/22/11 119 lb 4.8 oz (54.1 kg)   Ideal Body Weight:  44.7  kg  BMI:  Body mass index is 16.96 kg/m.  Estimated Nutritional Needs:  Kcal:  1350-1550 kcals (35-40 kcal/kg bw) Protein:  57-69 g Pro (1.5-1.8g/kg bw) Fluid:  1.4-1.6 L daily  EDUCATION NEEDS:  No education needs identified at this time  Burtis Junes RD, LDN, [place] Clinical Nutrition Pager: [phone] 08/07/2016 6:08 PM

## 2016-08-07 NOTE — Progress Notes (Signed)
[facility] PROGRESS NOTE  Courtney Thornton [id number] DOB: Jan 10, 1966 DOA: 08/06/2016  PCP: [facility]  Brief History/Interval Summary: 50 y.o. female with medical history significant of alcohol abuse, anxiety, depression, GERD, HLD, and HTN,  who was recently discharged from [facility]. She was admitted there with complaints of bilateral foot pain and swelling. Patient was noted to have electrolyte imbalances. She was noted to be hypokalemic and hyponatremic. These abnormalities were corrected. Patient was given Lasix with improvement in the swelling. Alcohol abuse. She was also thought to be playing a role in her presentation. Patient was seen by physical therapy and plan was for her to go to skilled nursing facility. However, patient declined. In the end and went home instead. She came back to the hospital here with complains of foot pain. Patient is a very poor historian.  Consultants: None  Procedures: None  Antibiotics: Ceftriaxone  Subjective/Interval History: Patient complains of headache this morning. She denies any falls or head injuries recently. She complains of generalized fatigue and weakness. Her Courtney Thornton is at the bedside. He feels that she needs to go to rehabilitation. However, patient continues to refuse.   ROS: Denies any nausea or vomiting  Objective:  Vital Signs  Vitals:   08/06/16 2345 08/07/16 0025 08/07/16 0545 08/07/16 1000  BP: 123/89 120/82 109/69 92/67  Pulse: 97 93 (!) 102 (!) 107  Resp: 14 16 16 18   Temp:  99.5 F (37.5 C) 98.2 F (36.8 C) 98.5 F (36.9 C)  TempSrc:  Oral Oral Oral  SpO2: 100% 100% 100% 98%  Weight:  38.1 kg (83 lb 15.9 oz)    Height:        Intake/Output Summary (Last 24 hours) at 08/07/16 1330 Last data filed at 08/07/16 0900  Gross per 24 hour  Intake               30 ml  Output             1250 ml  Net            -1220 ml   Filed Weights   08/06/16 1526 08/07/16 0025    Weight: 38.6 kg (85 lb) 38.1 kg (83 lb 15.9 oz)    General appearance: alert, cooperative, appears stated age, cachectic, distracted and no distress Resp: Coarse breath sounds bilaterally without any wheezing, rales or rhonchi. Cardio: regular rate and rhythm, S1, S2 normal, no murmur, click, rub or gallop GI: Abdomen is slightly distended. Mildly tender. Appears to have hepatomegaly. No rebound, rigidity or guarding. Extremities: No significant swelling noted in both her lower extremities. Some restriction in range of motion of the left ankle. Neurologic: She is awake and alert. Oriented to person, place, time. Cranial nerves II-12 intact. Motor strength equal bilateral upper extremities. Some tremors are noted. Normal strength in the right lower extremity. Some difficulty with raising her left leg.  Lab Results:  Data Reviewed: I have personally reviewed following labs and imaging studies  CBC:  Recent Labs Lab 08/03/16 1019 08/04/16 0551 08/06/16 1535 08/06/16 2249 08/07/16 0605  WBC 15.2* 12.4* 18.3* 16.2* 15.5*  NEUTROABS  --   --   --  11.5*  --   HGB 9.3* 9.5* 9.3* 9.3* 9.8*  HCT 28.1* 29.0* 28.1* 27.4* 29.3*  MCV 100.7* 101.8* 97.9 97.2 97.7  PLT 270 285 387 339 405*    Basic Metabolic Panel:  Recent Labs Lab 08/01/16 0710 08/02/16 0706 08/03/16 1019 08/04/16 0551  08/06/16 1535 08/06/16 2249 08/07/16 0605  NA 133* 131* 131* 133* 135  --  139  K 3.8 5.5* 4.2 3.2* 2.8*  --  3.3*  CL 100* 102 102 103 102  --  106  CO2 27 26 25 23 23   --  24  GLUCOSE 145* 104* 94 97 118*  --  98  BUN <5* <5* <5* <5* <5*  --  <5*  CREATININE 0.30* 0.30* 0.35* <0.30* 0.44  --  0.40*  CALCIUM 7.2* 8.0* 8.5* 8.2* 8.5*  --  8.1*  MG 2.0  --   --   --   --  2.2 1.9  PHOS  --   --   --   --   --  1.2* 1.0*    GFR: Estimated Creatinine Clearance: 49.5 mL/min (by C-G formula based on SCr of 0.4 mg/dL (L)).  Liver Function Tests:  Recent Labs Lab 08/01/16 0710 08/02/16 0706  08/06/16 2249 08/07/16 0605  AST 83* 91* 61* 68*  ALT 43 45 37 36  ALKPHOS 224* 236* 258* 274*  BILITOT 1.5* 1.5* 1.3* 1.1  PROT 5.1* 5.4* 5.3* 5.6*  ALBUMIN 2.0* 2.1* 2.0* 2.1*    No results for input(s): LIPASE, AMYLASE in the last 168 hours.  Recent Labs Lab 08/07/16 1103  AMMONIA 63*    Coagulation Profile:  Recent Labs Lab 08/07/16 0605  INR 1.12    Thyroid Function Tests:  Recent Labs  08/07/16 0605  TSH 4.729*     Recent Results (from the past 240 hour(s))  C difficile quick scan w PCR reflex     Status: None   Collection Time: 07/31/16  2:18 PM  Result Value Ref Range Status   C Diff antigen NEGATIVE NEGATIVE Final   C Diff toxin NEGATIVE NEGATIVE Final   C Diff interpretation No C. difficile detected.  Final      Radiology Studies: Dg Chest 2 View  Result Date: 08/06/2016 CLINICAL DATA:  Acute onset of leukocytosis.  Initial encounter. EXAM: CHEST  2 VIEW COMPARISON:  Chest radiograph performed 07/31/2016 FINDINGS: The lungs are well-aerated. Minimal right midlung and left basilar opacities likely reflect atelectasis. There is no evidence of pleural effusion or pneumothorax. The heart is normal in size; the mediastinal contour is within normal limits. No acute osseous abnormalities are seen. Cervical fusion hardware is partially imaged. IMPRESSION: Minimal right midlung and left basilar opacities likely reflect atelectasis. Electronically Signed   By: Garald Balding M.D.   On: 08/06/2016 22:36   Dg Foot Complete Left  Result Date: 08/05/2016 CLINICAL DATA:  Left foot pain EXAM: LEFT FOOT - COMPLETE 3+ VIEW COMPARISON:  05/28/2014 FINDINGS: No fracture or subluxation is evident. Old fracture deformity involving the proximal fourth proximal phalanx. There are patchy areas of osteopenia involving the distal fifth metatarsal and proximal phalanx with periarticular osteopenia of the first through fifth MTP joints. IMPRESSION: 1. No definite acute displaced  fracture or malalignment 2. Patchy areas of osteopenia, involving the digits of the left foot, findings could be secondary to disuse osteopenia, infection, or early manifestation of inflammatory arthropathy. Clinical correlation is recommended. Electronically Signed   By: Donavan Foil M.D.   On: 08/05/2016 20:51   Dg Hip Unilat With Pelvis 2-3 Views Left  Result Date: 08/05/2016 CLINICAL DATA:  Left hip pain EXAM: DG HIP (WITH OR WITHOUT PELVIS) 2-3V LEFT COMPARISON:  04/29/2016 FINDINGS: There is no evidence of hip fracture or dislocation. There is no evidence of arthropathy or other  focal bone abnormality. IMPRESSION: Negative. Electronically Signed   By: Donavan Foil M.D.   On: 08/05/2016 20:52     Medications:  Scheduled: . cefTRIAXone (ROCEPHIN)  IV  1 g Intravenous Q24H  . feeding supplement (ENSURE ENLIVE)  237 mL Oral BID BM  . lactulose  10 g Oral BID  . phosphorus  500 mg Oral TID  . sertraline  50 mg Oral Daily  . sodium chloride flush  3 mL Intravenous Q12H  . sodium chloride flush  3 mL Intravenous Q12H  . thiamine  100 mg Oral Daily   Continuous:  [id number] chloride, acetaminophen **OR** acetaminophen, albuterol, HYDROcodone-acetaminophen, ondansetron **OR** ondansetron (ZOFRAN) IV, sodium chloride flush  Assessment/Plan:  Active Problems:   Hypokalemia   Alcohol abuse   Anemia   Diarrhea   Protein-calorie malnutrition, severe (HCC)   HTN (hypertension)   GERD (gastroesophageal reflux disease)   Leukocytosis    Left foot pain. She had recent evaluation for the same at [place] did not show any acute fractures. Old injury may have been present. No DVT was noted on recent Doppler study. Physical therapy evaluation. Pain control. Uric acid level is 4.1.  Headache. Unable to get much more information from her. Unclear if this occurred recently or has been present previously. Proceed with CT head  Hypokalemia and hypophosphatemia. Potassium  will be repleted. Already improved this morning. She will also be given Neutra-Phos. Hypophosphatemia could be contributing to her generalized weakness and fatigue. Magnesium level was normal.  Hyperammonemia. Could have a component of hepatic encephalopathy. Low-dose lactulose.  History of alcohol abuse with possible hepatomegaly and abnormal LFTs. Proceed with the ultrasound of her abdomen. No concerns for withdrawal at this time. Continue with thiamine.  Abnormal UA with leukocytosis/possible UTI. Initiate ceftriaxone. Urine culture.  Severe protein calorie malnutrition. Ensure.  Normocytic anemia Vitamin B-12 level was 2700. Ferritin was 621 recently. Folic acid level 6.8. Hemoglobin is stable  DVT Prophylaxis: SCDs    Code Status: Full code  Family Communication: Discussed with the patient and her Courtney Thornton  Disposition Plan: Management as outlined above. Disposition will be the main challenge. Await PT evaluation. Patient states that she will go to her daughter's place at the time of discharge.    LOS: 0 days   [facility] Pager [phone] 08/07/2016, 1:30 PM  If 7PM-7AM, please contact night-coverage at [email or web address], password [facility]

## 2016-08-08 ENCOUNTER — Inpatient Hospital Stay (HOSPITAL_COMMUNITY): Payer: Medicare Other

## 2016-08-08 DIAGNOSIS — R7989 Other specified abnormal findings of blood chemistry: Secondary | ICD-10-CM

## 2016-08-08 LAB — URINE CULTURE: Culture: <10000 — AB

## 2016-08-08 LAB — BASIC METABOLIC PANEL
Anion gap: 6 (ref 5–15)
CHLORIDE: 103 mmol/L (ref 101–111)
CO2: 22 mmol/L (ref 22–32)
CREATININE: 0.38 mg/dL — AB (ref 0.44–1.00)
Calcium: 7.8 mg/dL — ABNORMAL LOW (ref 8.9–10.3)
GFR calc Af Amer: >60 mL/min (ref >60)
GFR calc non Af Amer: >60 mL/min (ref >60)
Glucose, Bld: 98 mg/dL (ref 65–99)
Potassium: 4.4 mmol/L (ref 3.5–5.1)
Sodium: 131 mmol/L — ABNORMAL LOW (ref 135–145)

## 2016-08-08 LAB — COMPREHENSIVE METABOLIC PANEL
ALBUMIN: 1.8 g/dL — AB (ref 3.5–5.0)
ALK PHOS: 230 U/L — AB (ref 38–126)
ALT: 32 U/L (ref 14–54)
ANION GAP: 8 (ref 5–15)
AST: 57 U/L — AB (ref 15–41)
BILIRUBIN TOTAL: 0.9 mg/dL (ref 0.3–1.2)
CALCIUM: 7.7 mg/dL — AB (ref 8.9–10.3)
CO2: 23 mmol/L (ref 22–32)
Chloride: 109 mmol/L (ref 101–111)
Creatinine, Ser: 0.32 mg/dL — ABNORMAL LOW (ref 0.44–1.00)
GFR calc Af Amer: >60 mL/min (ref >60)
GFR calc non Af Amer: >60 mL/min (ref >60)
GLUCOSE: 84 mg/dL (ref 65–99)
Potassium: 2.6 mmol/L — CL (ref 3.5–5.1)
Sodium: 140 mmol/L (ref 135–145)
TOTAL PROTEIN: 5.1 g/dL — AB (ref 6.5–8.1)

## 2016-08-08 LAB — CBC
HEMATOCRIT: 26.2 % — AB (ref 36.0–46.0)
HEMOGLOBIN: 8.7 g/dL — AB (ref 12.0–15.0)
MCH: 32.6 pg (ref 26.0–34.0)
MCHC: 33.2 g/dL (ref 30.0–36.0)
MCV: 98.1 fL (ref 78.0–100.0)
Platelets: 376 10*3/uL (ref 150–400)
RBC: 2.67 MIL/uL — ABNORMAL LOW (ref 3.87–5.11)
RDW: 17.5 % — ABNORMAL HIGH (ref 11.5–15.5)
WBC: 16.3 10*3/uL — ABNORMAL HIGH (ref 4.0–10.5)

## 2016-08-08 LAB — MAGNESIUM: Magnesium: 1.8 mg/dL (ref 1.7–2.4)

## 2016-08-08 LAB — PHOSPHORUS: Phosphorus: 3.4 mg/dL (ref 2.5–4.6)

## 2016-08-08 MED ORDER — VITAMIN B-1 100 MG PO TABS
100.0000 mg | ORAL_TABLET | Freq: Every day | ORAL | Status: DC
  Administered 2016-08-08 – 2016-08-10 (×3): 100 mg via ORAL
  Filled 2016-08-08 (×3): qty 1

## 2016-08-08 MED ORDER — LACTULOSE 10 GM/15ML PO SOLN
10.0000 g | Freq: Every day | ORAL | Status: DC
  Administered 2016-08-09 – 2016-08-10 (×2): 10 g via ORAL
  Filled 2016-08-08 (×2): qty 15

## 2016-08-08 MED ORDER — LORAZEPAM 1 MG PO TABS
1.0000 mg | ORAL_TABLET | Freq: Four times a day (QID) | ORAL | Status: DC | PRN
  Administered 2016-08-08 – 2016-08-09 (×2): 1 mg via ORAL
  Filled 2016-08-08: qty 1

## 2016-08-08 MED ORDER — LORAZEPAM 2 MG/ML IJ SOLN: 1.0000 mg | Freq: Four times a day (QID) | INTRAMUSCULAR | Status: DC | PRN

## 2016-08-08 MED ORDER — POTASSIUM CHLORIDE CRYS ER 20 MEQ PO TBCR
40.0000 meq | EXTENDED_RELEASE_TABLET | ORAL | Status: AC
  Administered 2016-08-08 (×3): 40 meq via ORAL
  Filled 2016-08-08 (×3): qty 2

## 2016-08-08 MED ORDER — MAGNESIUM SULFATE 2 GM/50ML IV SOLN
2.0000 g | Freq: Once | INTRAVENOUS | Status: AC
  Administered 2016-08-08: 2 g via INTRAVENOUS
  Filled 2016-08-08: qty 50

## 2016-08-08 MED ORDER — SODIUM CHLORIDE 0.9 % IV SOLN
INTRAVENOUS | Status: DC
  Administered 2016-08-08: 10:00:00 via INTRAVENOUS

## 2016-08-08 MED ORDER — SODIUM CHLORIDE 0.9 % IV SOLN
30.0000 meq | Freq: Once | INTRAVENOUS | Status: AC
  Administered 2016-08-08: 30 meq via INTRAVENOUS
  Filled 2016-08-08: qty 15

## 2016-08-08 MED ORDER — THIAMINE HCL 100 MG/ML IJ SOLN: 100.0000 mg | Freq: Every day | INTRAMUSCULAR | Status: DC

## 2016-08-08 MED ORDER — LORAZEPAM 1 MG PO TABS
0.0000 mg | ORAL_TABLET | Freq: Two times a day (BID) | ORAL | Status: DC
  Filled 2016-08-08: qty 1

## 2016-08-08 MED ORDER — FOLIC ACID 1 MG PO TABS
1.0000 mg | ORAL_TABLET | Freq: Every day | ORAL | Status: DC
  Administered 2016-08-08 – 2016-08-10 (×3): 1 mg via ORAL
  Filled 2016-08-08 (×3): qty 1

## 2016-08-08 MED ORDER — IOPAMIDOL (ISOVUE-300) INJECTION 61%
INTRAVENOUS | Status: AC
  Filled 2016-08-08: qty 30

## 2016-08-08 MED ORDER — ADULT MULTIVITAMIN W/MINERALS CH
1.0000 | ORAL_TABLET | Freq: Every day | ORAL | Status: DC
  Administered 2016-08-08 – 2016-08-10 (×3): 1 via ORAL
  Filled 2016-08-08 (×3): qty 1

## 2016-08-08 MED ORDER — LORAZEPAM 1 MG PO TABS
0.0000 mg | ORAL_TABLET | Freq: Four times a day (QID) | ORAL | Status: AC
  Administered 2016-08-08: 2 mg via ORAL
  Administered 2016-08-09: 1 mg via ORAL
  Filled 2016-08-08: qty 1
  Filled 2016-08-08: qty 2

## 2016-08-08 NOTE — Progress Notes (Signed)
[facility] PROGRESS NOTE  Courtney Thornton [id number] DOB: 1966-04-01 DOA: 08/06/2016  PCP: [facility]  Brief History/Interval Summary: 50 y.o. female with medical history significant of alcohol abuse, anxiety, depression, GERD, HLD, and HTN,  who was recently discharged from [facility]. She was admitted there with complaints of bilateral foot pain and swelling. Patient was noted to have electrolyte imbalances. She was noted to be hypokalemic and hyponatremic. These abnormalities were corrected. Patient was given Lasix with improvement in the swelling. Alcohol abuse. She was also thought to be playing a role in her presentation. Patient was seen by physical therapy and plan was for her to go to skilled nursing facility. However, patient declined. In the end and went home instead. She came back to the hospital here with complains of foot pain. Patient is a very poor historian. Patient found to have severe hypokalemia. She was hospitalized for further management.  Consultants: None  Procedures: None  Antibiotics: Ceftriaxone  Subjective/Interval History: Patient seems to be confused and distracted this morning. Her husband is at the bedside. She states that she wants to go home. Denies any pain.  ROS: Denies any nausea or vomiting  Objective:  Vital Signs  Vitals:   08/07/16 1751 08/07/16 1832 08/07/16 2134 08/08/16 0633  BP: 107/71 101/80 109/76 110/75  Pulse: 100 94 83 90  Resp: 18 18 18 18   Temp: 98.5 F (36.9 C) 98.6 F (37 C) 98.6 F (37 C) 98.5 F (36.9 C)  TempSrc: Oral Oral Oral Oral  SpO2: 100% 100% 100% 100%  Weight:   37.5 kg (82 lb 10.8 oz)   Height:        Intake/Output Summary (Last 24 hours) at 08/08/16 0911 Last data filed at 08/08/16 [id number]  Gross per 24 hour  Intake              290 ml  Output                0 ml  Net              290 ml   Filed Weights   08/06/16 1526 08/07/16 0025 08/07/16 2134    Weight: 38.6 kg (85 lb) 38.1 kg (83 lb 15.9 oz) 37.5 kg (82 lb 10.8 oz)    General appearance: alert, cooperative, appears stated age, cachectic, distracted and no distress Resp: Coarse breath sounds bilaterally without any wheezing, rales or rhonchi. Cardio: regular rate and rhythm, S1, S2 normal, no murmur, click, rub or gallop GI: Abdomen is slightly distended. tenderDiffusely without any rebound, rigidity or guarding.Marland Kitchen Appears to have hepatomegaly.  Extremities: No significant swelling noted in both her lower extremities. Some restriction in range of motion of the left ankle. Neurologic: She is awake and alert. Oriented to person, place, time. Cranial nerves II-12 intact. Motor strength equal bilateral upper extremities. Some tremors are noted. Normal strength in the right lower extremity. Some difficulty with raising her left leg.  Lab Results:  Data Reviewed: I have personally reviewed following labs and imaging studies  CBC:  Recent Labs Lab 08/04/16 0551 08/06/16 1535 08/06/16 2249 08/07/16 0605 08/08/16 0732  WBC 12.4* 18.3* 16.2* 15.5* 16.3*  NEUTROABS  --   --  11.5*  --   --   HGB 9.5* 9.3* 9.3* 9.8* 8.7*  HCT 29.0* 28.1* 27.4* 29.3* 26.2*  MCV 101.8* 97.9 97.2 97.7 98.1  PLT 285 387 339 405* [id number]    Basic Metabolic Panel:  Recent  Labs Lab 08/03/16 1019 08/04/16 0551 08/06/16 1535 08/06/16 2249 08/07/16 0605 08/08/16 0732  NA 131* 133* 135  --  139 140  K 4.2 3.2* 2.8*  --  3.3* 2.6*  CL 102 103 102  --  106 109  CO2 25 23 23   --  24 23  GLUCOSE 94 97 118*  --  98 84  BUN <5* <5* <5*  --  <5* <5*  CREATININE 0.35* <0.30* 0.44  --  0.40* 0.32*  CALCIUM 8.5* 8.2* 8.5*  --  8.1* 7.7*  MG  --   --   --  2.2 1.9  --   PHOS  --   --   --  1.2* 1.0* 3.4    GFR: Estimated Creatinine Clearance: 48.7 mL/min (by C-G formula based on SCr of 0.32 mg/dL (L)).  Liver Function Tests:  Recent Labs Lab 08/02/16 0706 08/06/16 2249 08/07/16 0605 08/08/16 0732   AST 91* 61* 68* 57*  ALT 45 37 36 32  ALKPHOS 236* 258* 274* 230*  BILITOT 1.5* 1.3* 1.1 0.9  PROT 5.4* 5.3* 5.6* 5.1*  ALBUMIN 2.1* 2.0* 2.1* 1.8*    No results for input(s): LIPASE, AMYLASE in the last 168 hours.  Recent Labs Lab 08/07/16 1103  AMMONIA 63*    Coagulation Profile:  Recent Labs Lab 08/07/16 0605  INR 1.12    Thyroid Function Tests:  Recent Labs  08/07/16 0605  TSH 4.729*     Recent Results (from the past 240 hour(s))  C difficile quick scan w PCR reflex     Status: None   Collection Time: 07/31/16  2:18 PM  Result Value Ref Range Status   C Diff antigen NEGATIVE NEGATIVE Final   C Diff toxin NEGATIVE NEGATIVE Final   C Diff interpretation No C. difficile detected.  Final      Radiology Studies: Dg Chest 2 View  Result Date: 08/06/2016 CLINICAL DATA:  Acute onset of leukocytosis.  Initial encounter. EXAM: CHEST  2 VIEW COMPARISON:  Chest radiograph performed 07/31/2016 FINDINGS: The lungs are well-aerated. Minimal right midlung and left basilar opacities likely reflect atelectasis. There is no evidence of pleural effusion or pneumothorax. The heart is normal in size; the mediastinal contour is within normal limits. No acute osseous abnormalities are seen. Cervical fusion hardware is partially imaged. IMPRESSION: Minimal right midlung and left basilar opacities likely reflect atelectasis. Electronically Signed   By: Garald Balding M.D.   On: 08/06/2016 22:36   Ct Head Wo Contrast  Result Date: 08/07/2016 CLINICAL DATA:  Headache EXAM: CT HEAD WITHOUT CONTRAST TECHNIQUE: Contiguous axial images were obtained from the base of the skull through the vertex without intravenous contrast. COMPARISON:  None. FINDINGS: Brain: No intracranial hemorrhage, mass effect or midline shift. Mild cerebral atrophy. Mild periventricular white matter decreased attenuation probable due to chronic small vessel ischemic changes. No acute cortical infarction. No mass  lesion is noted on this unenhanced scan. Vascular: Mild atherosclerotic calcifications of carotid siphon. Skull: No skull fracture is noted. Sinuses/Orbits: No acute findings Other: None IMPRESSION: No acute intracranial abnormality. Mild cerebral atrophy. Mild periventricular white matter decreased attenuation probable due to chronic small vessel ischemic changes. Electronically Signed   By: Lahoma Crocker M.D.   On: 08/07/2016 20:05   US Abdomen Complete  Result Date: 08/07/2016 CLINICAL DATA:  Abdominal pain for 1 week, increased LFTs EXAM: ABDOMEN ULTRASOUND COMPLETE COMPARISON:  CT scan 09/13/2008 FINDINGS: Gallbladder: Gallstones and sludge noted within gallbladder. No sonographic Murphy's sign.  There is mild thickening of gallbladder wall up to 3.6 mm. Common bile duct: Diameter: 8.7 mm in diameter prominent in size Liver: There is diffuse increased echogenicity of the liver with nodular contour. Chronic liver disease or early cirrhosis cannot be excluded. Clinical correlation is necessary. No focal hepatic mass. IVC: No abnormality visualized. Pancreas: Not seen due to bowel gas Spleen: Size and appearance within normal limits. Measures 4.7 cm in length Right Kidney: Length: 11.4 cm. Echogenicity within normal limits. No mass or hydronephrosis visualized. Left Kidney: Length: 11.2 cm. Echogenicity within normal limits. No mass or hydronephrosis visualized. Abdominal aorta: No aneurysm visualized. Measures up to 2.2 cm in diameter. Other findings: Perihepatic ascites is noted. IMPRESSION: 1. Small gallstones and layering sludge noted within gallbladder. There is mild thickening of gallbladder wall up to 3.6 mm without sonographic Murphy's sign. 2. There is diffuse increased echogenicity of the liver with nodular contour suspicious for chronic liver disease or cirrhosis. Perihepatic ascites is noted. No focal hepatic mass. 3. No hydronephrosis. 4. No aortic aneurysm. Electronically Signed   By: Lahoma Crocker  M.D.   On: 08/07/2016 18:38     Medications:  Scheduled: . cefTRIAXone (ROCEPHIN)  IV  1 g Intravenous Q24H  . feeding supplement (ENSURE ENLIVE)  237 mL Oral BID BM  . folic acid  1 mg Oral Daily  . [START ON 08/09/2016] lactulose  10 g Oral Daily  . LORazepam  0-4 mg Oral Q6H   Followed by  . [START ON 08/10/2016] LORazepam  0-4 mg Oral Q12H  . multivitamin with minerals  1 tablet Oral Daily  . phosphorus  500 mg Oral TID  . potassium chloride (KCL MULTIRUN) 30 mEq in 265 mL IVPB  30 mEq Intravenous Once  . potassium chloride  40 mEq Oral Q4H  . sertraline  50 mg Oral Daily  . sodium chloride flush  3 mL Intravenous Q12H  . sodium chloride flush  3 mL Intravenous Q12H  . thiamine  100 mg Oral Daily   Or  . thiamine  100 mg Intravenous Daily   Continuous: . sodium chloride     [id number] chloride, acetaminophen **OR** acetaminophen, albuterol, HYDROcodone-acetaminophen, LORazepam **OR** LORazepam, ondansetron **OR** ondansetron (ZOFRAN) IV, sodium chloride flush  Assessment/Plan:  Active Problems:   Hypokalemia   Alcohol abuse   Anemia   Diarrhea   Protein-calorie malnutrition, severe (HCC)   HTN (hypertension)   GERD (gastroesophageal reflux disease)   Leukocytosis    Hypokalemia and hypophosphatemia. Potassium level has decreased again this morning. Etiology remains unclear. We will order random urine sodium, potassium levels. Magnesium is 1.8. She'll be given some magnesium sulfate. Potassium will be repleted aggressively. We'll repeat blood work later today. Continue Neutra-Phos. Hypophosphatemia could be contributing to her generalized weakness and fatigue.  Headache. CT head did not show any acute findings. Does not appear to be in any discomfort at this time.   Mild acute encephalopathy Patient appears to be distracted and confused at times. Ammonia level was noted to be elevated. She was given lactulose, but she is experiencing diarrhea as a result. Check  RPR, HIV. Check hepatitis panel. CT head as above. No focal neurological deficits. TSH mildly elevated. B-12 level was normal when checked recently. Check free T4 in the morning.  Abdominal Tenderness Ultrasound of the abdomen did show liver disease. Gallstones were noted. Mild thickening of the gallbladder wall was seen, however. Murphy sign was negative. Possible cirrhosis. No other abnormalities noted. LFTs are mildly abnormal.  Lipase was normal. Proceed with CT scan of the abdomen and pelvis. If unremarkable, we may have to proceed with HIDA scan to conclusively rule out cholecystitis.  Left foot pain. She had recent evaluation for the same at [place] did not show any acute fractures. Old injury may have been present. No DVT was noted on recent Doppler study. Physical therapy evaluation. Pain control. Uric acid level is 4.1.  Hyperammonemia. Could have a component of hepatic encephalopathy. Low-dose lactulose. Cut back on the dose.  History of alcohol abuse with possible hepatomegaly and abnormal LFTs. Gallstones identified on ultrasound. Murphy's sign was negative. Continue to trend LFTs. Continue thiamine. Continue CIWA protocol.  Abnormal UA with leukocytosis/possible UTI. Initiated ceftriaxone. Urine culture.  Severe protein calorie malnutrition. Ensure.  Normocytic anemia Vitamin B-12 level was 2700. Ferritin was 621 recently. Folic acid level 6.8. Hemoglobin is stable  DVT Prophylaxis: SCDs    Code Status: Full code  Family Communication: Discussed with the patient and her husband  Disposition Plan: Management as outlined above. Disposition will be the main challenge. Await PT evaluation. SNF will be ideal. Await workup as above. Await improvement in electrolytes.     LOS: 1 day   [facility] Pager [phone] 08/08/2016, 9:11 AM  If 7PM-7AM, please contact night-coverage at [email or web address], password [facility]

## 2016-08-08 NOTE — Progress Notes (Signed)
PT Cancellation Note  Patient Details Name: Courtney Thornton MRN: [id number] DOB: [birth year]   Cancelled Treatment:    Reason Eval/Treat Not Completed: Medical issues which prohibited therapy.  K+ trending down.  RN starting to work on it, but hold until Architectural technologist. 08/08/2016  Donnella Sham, [place] [phone]  (pager)   [name] 08/08/2016, 11:43 AM

## 2016-08-08 NOTE — Progress Notes (Signed)
Pt disoriented to time, hands shaky. Anxious pulled out IV and pulled off telemetry. Says, "I'm getting ready to leave as soon as my husband leaves." Dr. Tana Coast notified. Orders received. Will continue to monitor.

## 2016-08-08 NOTE — Progress Notes (Signed)
CRITICAL VALUE ALERT  Critical value received:  K+ 2.6  Date of notification:  08/08/16  Time of notification:  08:35  Critical value read back: yes  Nurse who received alert:  Cassell Smiles   MD notified (1st page):  Dr. [place] Pink  Time of first page:  10:37  MD notified (2nd page):  Time of second page:  Responding MD:    Time MD responded:

## 2016-08-08 NOTE — Progress Notes (Signed)
CSW reviewed chart, pt has had a 3 night stay in the last 30 days, CSW informed [facility], who is now considering pt. CSW will continue to monitor so that pt can DC today.  Thompson Mckim B. Joline Maxcy Clinical Social Work Dept Weekend Social Worker [phone] 2:31 PM

## 2016-08-09 LAB — COMPREHENSIVE METABOLIC PANEL
ALT: 32 U/L (ref 14–54)
AST: 54 U/L — AB (ref 15–41)
Albumin: 2 g/dL — ABNORMAL LOW (ref 3.5–5.0)
Alkaline Phosphatase: 228 U/L — ABNORMAL HIGH (ref 38–126)
Anion gap: 5 (ref 5–15)
BILIRUBIN TOTAL: 1 mg/dL (ref 0.3–1.2)
CO2: 22 mmol/L (ref 22–32)
Calcium: 8 mg/dL — ABNORMAL LOW (ref 8.9–10.3)
Chloride: 109 mmol/L (ref 101–111)
Creatinine, Ser: 0.33 mg/dL — ABNORMAL LOW (ref 0.44–1.00)
Glucose, Bld: 91 mg/dL (ref 65–99)
POTASSIUM: 4.1 mmol/L (ref 3.5–5.1)
Sodium: 136 mmol/L (ref 135–145)
TOTAL PROTEIN: 5.6 g/dL — AB (ref 6.5–8.1)

## 2016-08-09 LAB — CBC
HEMATOCRIT: 29.6 % — AB (ref 36.0–46.0)
Hemoglobin: 9.6 g/dL — ABNORMAL LOW (ref 12.0–15.0)
MCH: 31.9 pg (ref 26.0–34.0)
MCHC: 32.4 g/dL (ref 30.0–36.0)
MCV: 98.3 fL (ref 78.0–100.0)
Platelets: 354 10*3/uL (ref 150–400)
RBC: 3.01 MIL/uL — ABNORMAL LOW (ref 3.87–5.11)
RDW: 17.3 % — ABNORMAL HIGH (ref 11.5–15.5)
WBC: 18 10*3/uL — AB (ref 4.0–10.5)

## 2016-08-09 LAB — NA AND K (SODIUM & POTASSIUM), RAND UR
POTASSIUM UR: 59 mmol/L
Sodium, Ur: 56 mmol/L

## 2016-08-09 LAB — RPR: RPR Ser Ql: NONREACTIVE

## 2016-08-09 LAB — HIV ANTIBODY (ROUTINE TESTING W REFLEX): HIV SCREEN 4TH GENERATION: NONREACTIVE

## 2016-08-09 LAB — AMMONIA: AMMONIA: 39 umol/L — AB (ref 9–35)

## 2016-08-09 LAB — T4, FREE: Free T4: 0.98 ng/dL (ref 0.61–1.12)

## 2016-08-09 LAB — MAGNESIUM: Magnesium: 2 mg/dL (ref 1.7–2.4)

## 2016-08-09 NOTE — Progress Notes (Signed)
[facility] PROGRESS NOTE  Courtney Thornton [id number] DOB: 1966-07-31 DOA: 08/06/2016  PCP: [facility]  Brief History/Interval Summary: 50 y.o. female with medical history significant of alcohol abuse, anxiety, depression, GERD, HLD, and HTN,  who was recently discharged from [facility] hospital. She was admitted there with complaints of bilateral foot pain and swelling. Patient was noted to have electrolyte imbalances. She was noted to be hypokalemic and hyponatremic. These abnormalities were corrected. Patient was given Lasix with improvement in the swelling. Alcohol abuse. She was also thought to be playing a role in her presentation. Patient was seen by physical therapy and plan was for her to go to skilled nursing facility. However, patient declined. In the end and went home instead. She came back to the hospital here with complains of foot pain. Patient is a very poor historian. Patient found to have severe hypokalemia. She was hospitalized for further management.  Consultants: None  Procedures: None  Antibiotics: Ceftriaxone  Subjective/Interval History: Patient denies any complaints this morning, reports last drink she had about more than 2 weeks ago. No history of DTs  ROS: Denies any nausea or vomiting  Objective:  Vital Signs  Vitals:   08/08/16 2146 08/09/16 0612 08/09/16 0832 08/09/16 1130  BP: 108/78 114/80 119/84   Pulse: (!) 104 95 (!) 102 98  Resp: 19 19    Temp: 98.3 F (36.8 C) 98 F (36.7 C) 98.3 F (36.8 C)   TempSrc: Oral Oral Oral   SpO2: 100% 100% 99%   Weight: 38.1 kg (83 lb 15.9 oz)     Height:        Intake/Output Summary (Last 24 hours) at 08/09/16 1257 Last data filed at 08/09/16 1100  Gross per 24 hour  Intake          1764.17 ml  Output                0 ml  Net          1764.17 ml   Filed Weights   08/07/16 0025 08/07/16 2134 08/08/16 2146  Weight: 38.1 kg (83 lb 15.9 oz) 37.5 kg (82 lb 10.8 oz)  38.1 kg (83 lb 15.9 oz)    General appearance: alert, cooperative, appears stated age, cachectic, distracted and no distress Resp: Coarse breath sounds bilaterally without any wheezing, rales or rhonchi. Cardio: regular rate and rhythm, S1, S2 normal, no murmur, click, rub or gallop GI: Abdomen is slightly distended. tenderDiffusely without any rebound, rigidity or guarding.Marland Kitchen Appears to have hepatomegaly.  Extremities: No significant swelling noted in both her lower extremities. Some restriction in range of motion of the left ankle. Neurologic: She is awake and alert. Oriented to person, place, time. Cranial nerves II-12 intact. Motor strength equal bilateral upper extremities. Some tremors are noted. Normal strength in the right lower extremity. Some difficulty with raising her left leg.  Lab Results:  Data Reviewed: I have personally reviewed following labs and imaging studies  CBC:  Recent Labs Lab 08/06/16 1535 08/06/16 2249 08/07/16 0605 08/08/16 0732 08/09/16 0530  WBC 18.3* 16.2* 15.5* 16.3* 18.0*  NEUTROABS  --  11.5*  --   --   --   HGB 9.3* 9.3* 9.8* 8.7* 9.6*  HCT 28.1* 27.4* 29.3* 26.2* 29.6*  MCV 97.9 97.2 97.7 98.1 98.3  PLT 387 339 405* 376 [id number]    Basic Metabolic Panel:  Recent Labs Lab 08/06/16 1535 08/06/16 2249 08/07/16 0605 08/08/16 0732 08/08/16 0900 08/08/16  1957 08/09/16 0530  NA 135  --  139 140  --  131* 136  K 2.8*  --  3.3* 2.6*  --  4.4 4.1  CL 102  --  106 109  --  103 109  CO2 23  --  24 23  --  22 22  GLUCOSE 118*  --  98 84  --  98 91  BUN <5*  --  <5* <5*  --  <5* <5*  CREATININE 0.44  --  0.40* 0.32*  --  0.38* 0.33*  CALCIUM 8.5*  --  8.1* 7.7*  --  7.8* 8.0*  MG  --  2.2 1.9  --  1.8  --  2.0  PHOS  --  1.2* 1.0* 3.4  --   --   --     GFR: Estimated Creatinine Clearance: 49.5 mL/min (by C-G formula based on SCr of 0.33 mg/dL (L)).  Liver Function Tests:  Recent Labs Lab 08/06/16 2249 08/07/16 0605 08/08/16 0732  08/09/16 0530  AST 61* 68* 57* 54*  ALT 37 36 32 32  ALKPHOS 258* 274* 230* 228*  BILITOT 1.3* 1.1 0.9 1.0  PROT 5.3* 5.6* 5.1* 5.6*  ALBUMIN 2.0* 2.1* 1.8* 2.0*    No results for input(s): LIPASE, AMYLASE in the last 168 hours.  Recent Labs Lab 08/07/16 1103 08/09/16 0530  AMMONIA 63* 39*    Coagulation Profile:  Recent Labs Lab 08/07/16 0605  INR 1.12    Thyroid Function Tests:  Recent Labs  08/07/16 0605 08/09/16 0530  TSH 4.729*  --   FREET4  --  0.98     Recent Results (from the past 240 hour(s))  C difficile quick scan w PCR reflex     Status: None   Collection Time: 07/31/16  2:18 PM  Result Value Ref Range Status   C Diff antigen NEGATIVE NEGATIVE Final   C Diff toxin NEGATIVE NEGATIVE Final   C Diff interpretation No C. difficile detected.  Final  Culture, Urine     Status: Abnormal   Collection Time: 08/07/16  7:24 PM  Result Value Ref Range Status   Specimen Description URINE, RANDOM  Final   Special Requests NONE  Final   Culture <10,000 COLONIES/mL INSIGNIFICANT GROWTH (A)  Final   Report Status 08/08/2016 FINAL  Final      Radiology Studies: Ct Head Wo Contrast  Result Date: 08/07/2016 CLINICAL DATA:  Headache EXAM: CT HEAD WITHOUT CONTRAST TECHNIQUE: Contiguous axial images were obtained from the base of the skull through the vertex without intravenous contrast. COMPARISON:  None. FINDINGS: Brain: No intracranial hemorrhage, mass effect or midline shift. Mild cerebral atrophy. Mild periventricular white matter decreased attenuation probable due to chronic small vessel ischemic changes. No acute cortical infarction. No mass lesion is noted on this unenhanced scan. Vascular: Mild atherosclerotic calcifications of carotid siphon. Skull: No skull fracture is noted. Sinuses/Orbits: No acute findings Other: None IMPRESSION: No acute intracranial abnormality. Mild cerebral atrophy. Mild periventricular white matter decreased attenuation probable due  to chronic small vessel ischemic changes. Electronically Signed   By: Lahoma Crocker M.D.   On: 08/07/2016 20:05   US Abdomen Complete  Result Date: 08/07/2016 CLINICAL DATA:  Abdominal pain for 1 week, increased LFTs EXAM: ABDOMEN ULTRASOUND COMPLETE COMPARISON:  CT scan 09/13/2008 FINDINGS: Gallbladder: Gallstones and sludge noted within gallbladder. No sonographic Murphy's sign. There is mild thickening of gallbladder wall up to 3.6 mm. Common bile duct: Diameter: 8.7 mm in diameter prominent  in size Liver: There is diffuse increased echogenicity of the liver with nodular contour. Chronic liver disease or early cirrhosis cannot be excluded. Clinical correlation is necessary. No focal hepatic mass. IVC: No abnormality visualized. Pancreas: Not seen due to bowel gas Spleen: Size and appearance within normal limits. Measures 4.7 cm in length Right Kidney: Length: 11.4 cm. Echogenicity within normal limits. No mass or hydronephrosis visualized. Left Kidney: Length: 11.2 cm. Echogenicity within normal limits. No mass or hydronephrosis visualized. Abdominal aorta: No aneurysm visualized. Measures up to 2.2 cm in diameter. Other findings: Perihepatic ascites is noted. IMPRESSION: 1. Small gallstones and layering sludge noted within gallbladder. There is mild thickening of gallbladder wall up to 3.6 mm without sonographic Murphy's sign. 2. There is diffuse increased echogenicity of the liver with nodular contour suspicious for chronic liver disease or cirrhosis. Perihepatic ascites is noted. No focal hepatic mass. 3. No hydronephrosis. 4. No aortic aneurysm. Electronically Signed   By: Lahoma Crocker M.D.   On: 08/07/2016 18:38   Ct Abdomen Pelvis W Contrast  Result Date: 08/08/2016 CLINICAL DATA:  History of alcohol abuse gallbladder problems and liver disease weight loss EXAM: CT ABDOMEN AND PELVIS WITH CONTRAST TECHNIQUE: Multidetector CT imaging of the abdomen and pelvis was performed using the standard protocol  following bolus administration of intravenous contrast. CONTRAST:  75 cc Isovue 300 intravenous COMPARISON:  Ultrasound 08/07/2016.  CT scan 09/13/2008 FINDINGS: Lower chest: Small bilateral right greater than left pleural effusions. Passive atelectasis within the posterior lung bases. Normal heart size. Hepatobiliary: Liver enlarged with right lobe length craniocaudad measurement of 22 cm. There is diffuse decreased density of the liver parenchyma with scattered areas is increased density, likely indicating fatty infiltration and probable areas of sparing. Questionable ring-enhancing lesion within the posterior right hepatic lobe measuring 1.8 cm, series 2, image number 16. There is no biliary dilatation. The gallbladder is slightly enlarged and contains multiple calcified stones. Pancreas: Unremarkable. No pancreatic ductal dilatation or surrounding inflammatory changes. Spleen: Normal in size without focal abnormality. Adrenals/Urinary Tract: Diffuse enlargement of left adrenal gland without discrete mass. Right adrenal gland is normal. Kidneys show no hydronephrosis or evidence for mass. Areas of increased density in the central right kidney likely relate to contrast extrusion into collecting system. Bladder appears within normal limits. Stomach/Bowel: There is nonspecific colon wall edema and thickening involving the right colon and hepatic flexure. No evidence for dilated small bowel to suggest obstruction. Vascular/Lymphatic: Atherosclerotic calcifications of non aneurysmal aorta. Nonspecific sub cm retroperitoneal lymph nodes. Reproductive: Uterus and adnexal structures are not identified. Other: Moderate pelvic ascites. Small free fluid in the upper abdomen. No free air. Musculoskeletal: Degenerative changes of the spine. No acute osseous abnormality. IMPRESSION: 1. Enlarged heterogenous low-attenuation liver compatible with fatty infiltration or early cirrhotic changes. Possible 1.8 cm rim enhancing mass  or nodule within the posterior right hepatic lobe. Suggest nonemergent MRI evaluation. 2. Slightly dilated gallbladder containing multiple calcified stones. No biliary dilatation. 3. Nonspecific colon wall thickening involving ascending colon and hepatic flexure, could relate to mild colitis. 4. Small abdominal ascites, moderate pelvic ascites, and small bilateral effusions. Electronically Signed   By: Donavan Foil M.D.   On: 08/08/2016 23:09     Medications:  Scheduled: . cefTRIAXone (ROCEPHIN)  IV  1 g Intravenous Q24H  . feeding supplement (ENSURE ENLIVE)  237 mL Oral BID BM  . folic acid  1 mg Oral Daily  . lactulose  10 g Oral Daily  . LORazepam  0-4  mg Oral Q6H   Followed by  . [START ON 08/10/2016] LORazepam  0-4 mg Oral Q12H  . multivitamin with minerals  1 tablet Oral Daily  . phosphorus  500 mg Oral TID  . sertraline  50 mg Oral Daily  . sodium chloride flush  3 mL Intravenous Q12H  . sodium chloride flush  3 mL Intravenous Q12H  . thiamine  100 mg Oral Daily   Or  . thiamine  100 mg Intravenous Daily   Continuous: . sodium chloride 50 mL/hr at 08/08/16 1025   [id number] chloride, acetaminophen **OR** acetaminophen, albuterol, HYDROcodone-acetaminophen, LORazepam **OR** LORazepam, ondansetron **OR** ondansetron (ZOFRAN) IV, sodium chloride flush  Assessment/Plan:  Active Problems:   Hypokalemia   Alcohol abuse   Anemia   Diarrhea   Protein-calorie malnutrition, severe (HCC)   HTN (hypertension)   GERD (gastroesophageal reflux disease)   Leukocytosis    Hypokalemia and hypophosphatemia. Potassium level has decreased again this morning. This is likely secondary to alcohol and diuresis.  Potassium went down to 2.6, repleted with oral supplements.  Headache. CT head did not show any acute findings. Does not appear to be in any discomfort at this time.   Mild acute encephalopathy Patient appears to be distracted and confused at times.  Ammonia level was noted  to be elevated. She was given lactulose, but she is experiencing diarrhea as a result.  Cannot determine clinically this is hepatic encephalopathy versus other metabolic encephalopathy.  Abdominal Tenderness Ultrasound of the abdomen did show liver disease. Gallstones were noted. Mild thickening of the gallbladder wall was seen, however. Murphy sign was negative. Possible cirrhosis. No other abnormalities noted. LFTs are mildly abnormal. Lipase was normal. Proceed with CT scan of the abdomen and pelvis. RUQ tenderness, she has hepatosplenomegaly, low albumin, suggesting early cirrhosis.  Left foot pain. She had recent evaluation for the same at [facility] did not show any acute fractures. Old injury may have been present. No DVT was noted on recent Doppler study. Physical therapy evaluation. Pain control. Uric acid level is 4.1. X-rays from the 18th showed left fibular old evulsion fracture.  Hyperammonemia. Could have a component of hepatic encephalopathy. Low-dose lactulose. Cut back on the dose.  History of alcohol abuse with possible hepatomegaly and abnormal LFTs. Gallstones identified on ultrasound. Murphy's sign was negative. Continue to trend LFTs. Continue thiamine. Continue CIWA protocol.  Abnormal UA with leukocytosis/possible UTI. Initiated ceftriaxone. Urine culture.  Severe protein calorie malnutrition. Ensure.  Normocytic anemia Vitamin B-12 level was 2700. Ferritin was 621 recently. Folic acid level 6.8. Hemoglobin is stable  DVT Prophylaxis: SCDs    Code Status: Full code  Family Communication: Discussed with the patient and her husband  Disposition Plan: Management as outlined above. Disposition will be the main challenge. Await PT evaluation. SNF will be ideal. Await workup as above. Await improvement in electrolytes.     LOS: 2 days   [facility] Pager [phone] 08/09/2016, 12:57 PM  If 7PM-7AM, please contact  night-coverage at [email or web address], password [facility]

## 2016-08-09 NOTE — Evaluation (Signed)
Physical Therapy Evaluation Patient Details Name: Courtney Thornton MRN: [id number] DOB: [birth year] Today's Date: 08/09/2016   History of Present Illness  Courtney Thornton is a 50 y.o. female with medical history significant of alcohol abuse, anxiety, depression, GERD, HLD, and HTN. Presented with complaints of persistent diarrhea which is chronic and pain over diffuse weakness all over. Patient has been recently admitted to [facility] for worsening bilateral foot pain and swelling she has chronic diarrhea with chronic hypokalemia. Patient was recommended to be discharged to SNF but declined she went home instead. After 24 hours at home with family, felt that they are unable to take care of her  Clinical Impression  Pt admitted with/for persistent diarrhea and weakness.  Pt currently and a mod to max assist level and limited functionally due to the problems listed. ( See problems list.)   Pt will benefit from PT to maximize function and safety in order to get ready for next venue listed below.     Follow Up Recommendations SNF    Equipment Recommendations  None recommended by PT    Recommendations for Other Services       Precautions / Restrictions Precautions Precautions: Fall Restrictions Weight Bearing Restrictions: No      Mobility  Bed Mobility Overal bed mobility: Needs Assistance Bed Mobility: Supine to Sit     Supine to sit: Mod assist;Min assist     General bed mobility comments: mod A for LEs to EOB, min A to elevate trunk  Transfers Overall transfer level: Needs assistance Equipment used: Rolling walker (2 wheeled) Transfers: Sit to/from [facility] Sit to Stand: Mod assist Stand pivot transfers: Max assist       General transfer comment: To/From [facility] and back to recliner with RW and max assist  Ambulation/Gait                Stairs            Wheelchair Mobility    Modified Rankin (Stroke Patients Only)        Balance Overall balance assessment: Needs assistance   Sitting balance-Leahy Scale: Fair       Standing balance-Leahy Scale: Zero Standing balance comment: Heavy posterior lean through out transfer and during the 2 standing trials for pericare.                             Pertinent Vitals/Pain Pain Assessment: Faces Pain Score: 4  Faces Pain Scale: Hurts little more Pain Location: bil feet Pain Descriptors / Indicators: Sore Pain Intervention(s): Monitored during session    Home Living Family/patient expects to be discharged to:: Skilled nursing facility Living Arrangements: Spouse/significant other   Type of Home: House Home Access: Stairs to enter   [employer] of Steps: 2 Home Layout: One level        Prior Function Level of Independence: Needs assistance;Independent with assistive device(s)   Gait / Transfers Assistance Needed: states that she uses a cane and that she "doesn't get around too well"  ADL's / Homemaking Assistance Needed: Pt states she was independent with ADL's        Hand Dominance   Dominant Hand: Right    Extremity/Trunk Assessment   Upper Extremity Assessment: Defer to OT evaluation           Lower Extremity Assessment: Generalized weakness         Communication   Communication: No difficulties  Cognition Arousal/Alertness:  Awake/alert Behavior During Therapy: Flat affect Overall Cognitive Status: Impaired/Different from baseline Area of Impairment: Following commands;Safety/judgement       Following Commands: Follows one step commands consistently Safety/Judgement: Decreased awareness of safety;Decreased awareness of deficits          General Comments      Exercises     Assessment/Plan    PT Assessment Patient needs continued PT services  PT Problem List Decreased strength;Decreased activity tolerance;Decreased balance;Decreased mobility;Decreased coordination;Decreased knowledge of use  of DME;Pain          PT Treatment Interventions DME instruction;Gait training;Functional mobility training;Therapeutic activities;Therapeutic exercise;Balance training;Neuromuscular re-education;Patient/family education    PT Goals (Current goals can be found in the Care Plan section)  Acute Rehab PT Goals Patient Stated Goal: go to rehab, then go home PT Goal Formulation: With patient Time For Goal Achievement: 08/23/16 Potential to Achieve Goals: Fair    Frequency Min 3X/week   Barriers to discharge   pt;s husband must work days.  pt is upset that husband isn't around, but also knows he has to "pay the bills"    Co-evaluation               End of Session   Activity Tolerance: Patient limited by fatigue Patient left: in chair;with call bell/phone within reach;with chair alarm set Nurse Communication: Need for lift equipment         Time: 1703-1730 PT Time Calculation (min) (ACUTE ONLY): 27 min   Charges:   PT Evaluation $PT Eval Moderate Complexity: 1 Procedure PT Treatments $Therapeutic Activity: 8-22 mins   PT G CodesTessie Fass Ngozi Alvidrez 08/09/2016, 5:39 PM 08/09/2016  Donnella Sham, [place] [phone]  (pager)

## 2016-08-09 NOTE — Clinical Social Work Note (Signed)
Patient needs PT evaluation before she can discharge to SNF.  Courtney Thornton, [place] [facility] [place]

## 2016-08-09 NOTE — Evaluation (Signed)
Occupational Therapy Evaluation Patient Details Name: Courtney Thornton MRN: [id number] DOB: 06-Jul-1966 Today's Date: 08/09/2016    History of Present Illness SHYANA DECASTRO is a 50 y.o. female with medical history significant of alcohol abuse, anxiety, depression, GERD, HLD, and HTN. Presented with complaints of persistent diarrhea which is chronic and pain over diffuse weakness all over. Patient has been recently admitted to [facility] for worsening bilateral foot pain and swelling she has chronic diarrhea with chronic hypokalemia. Patient was recommended to be discharged to SNF but declined she went home instead. After 24 hours at home with family, felt that they are unable to take care of her   Clinical Impression   Pt with decline in function and safety with ADLs and ADL mobility with decreased strength, balance, endurance and cognition. Upon entering room, pt not very motivated to work with OT, however pt agreeable to evaluation. As pt initiated sitting EOB, sheets and blankets soiled with BM and urine and pt stated that she had not called for nurse tech assist. Called for nurse tech to come in and assist OT with pt. Pt transferred to [facility], provided with pericare care hygiene and LB bathing and clean gown. Pt now agreeable to SNF rehab upon acute d/c. Pt would benefit from acute OT services to address impairments to increase level of function and safety  Follow Up Recommendations  SNF    Equipment Recommendations  None recommended by OT;Other (comment) (TBD at next venue of care)    Recommendations for Other Services       Precautions / Restrictions Precautions Precautions: Fall Restrictions Weight Bearing Restrictions: No      Mobility Bed Mobility Overal bed mobility: Needs Assistance Bed Mobility: Supine to Sit     Supine to sit: Mod assist;Min assist     General bed mobility comments: mod A for LEs to EOB, min A to elevate trunk  Transfers Overall transfer  level: Needs assistance Equipment used: None Transfers: Sit to/from [facility] Sit to Stand: Mod assist Stand pivot transfers: Max assist       General transfer comment: flexed posture during transfer, cues for correct hand placement    Balance     Sitting balance-Leahy Scale: Fair       Standing balance-Leahy Scale: Zero                              ADL Overall ADL's : Needs assistance/impaired Eating/Feeding: Set up;Bed level;Sitting   Grooming: Wash/dry hands;Wash/dry face;Min guard;Sitting   Upper Body Bathing: Minimal assitance;Sitting   Lower Body Bathing: Maximal assistance   Upper Body Dressing : Minimal assistance;Sitting   Lower Body Dressing: Total assistance   Toilet Transfer: Maximal assistance;BSC;Stand-pivot;Cueing for safety       Tub/ Shower Transfer: Total assistance;+2 for physical assistance   Functional mobility during ADLs: Maximal assistance General ADL Comments: +2 assist needed standing for hygiene and LB bathing     Vision Vision Assessment?: No apparent visual deficits              Pertinent Vitals/Pain Pain Assessment: 0-10 Pain Score: 4  Pain Location: B feet with mobility Pain Descriptors / Indicators: Sore;Heaviness Pain Intervention(s): Limited activity within patient's tolerance;Monitored during session;Repositioned     Hand Dominance Right   Extremity/Trunk Assessment Upper Extremity Assessment Upper Extremity Assessment: Generalized weakness   Lower Extremity Assessment Lower Extremity Assessment: Defer to PT evaluation  Communication Communication Communication: No difficulties   Cognition Arousal/Alertness: Awake/alert   Overall Cognitive Status: Impaired/Different from baseline Area of Impairment: Following commands;Safety/judgement       Following Commands: Follows one step commands consistently Safety/Judgement: Decreased awareness of safety;Decreased awareness of  deficits         General Comments   pt pleasant and cooperative                 Home Living Family/patient expects to be discharged to:: [place]: Spouse/significant other   Type of Home: House                                  Prior Functioning/Environment Level of Independence: Needs assistance;Independent with assistive device(s)  Gait / Transfers Assistance Needed: states that she uses a cane and that she "doesn't get around too well" ADL's / Homemaking Assistance Needed: Pt states she was independent with ADL's            OT Problem List: Decreased strength;Impaired balance (sitting and/or standing);Decreased cognition;Pain;Decreased knowledge of use of DME or AE;Decreased activity tolerance   OT Treatment/Interventions: Self-care/ADL training;DME and/or AE instruction;Therapeutic activities;Patient/family education    OT Goals(Current goals can be found in the care plan section) Acute Rehab OT Goals Patient Stated Goal: go to [facility] then go home OT Goal Formulation: With patient Time For Goal Achievement: 08/16/16 Potential to Achieve Goals: Good ADL Goals Pt Will Perform Grooming: with supervision;with set-up;sitting;with min guard assist Pt Will Perform Upper Body Bathing: with min guard assist;with supervision;with set-up;sitting Pt Will Perform Lower Body Bathing: with mod assist;sitting/lateral leans;sit to/from stand Pt Will Perform Upper Body Dressing: with min guard assist;with supervision;with set-up;sitting Pt Will Transfer to Toilet: bedside commode;with min assist;with mod assist  OT Frequency: Min 2X/week   Barriers to D/C: Decreased caregiver support                        End of Session Equipment Utilized During Treatment: Gait belt;Other (comment) (BSC) Nurse Communication: Mobility status  Activity Tolerance: Patient tolerated treatment well Patient left: in chair;with call  bell/phone within reach;with chair alarm set   Time: 1324-1410 OT Time Calculation (min): 46 min Charges:  OT General Charges $OT Visit: 1 Procedure OT Evaluation $OT Eval Moderate Complexity: 1 Procedure OT Treatments $Self Care/Home Management : 8-22 mins $Therapeutic Activity: 8-22 mins G-Codes:    Britt Bottom 08/09/2016, 2:29 PM

## 2016-08-10 LAB — HEPATITIS PANEL, ACUTE
Hep A IgM: NEGATIVE
Hep B C IgM: NEGATIVE
Hepatitis B Surface Ag: NEGATIVE

## 2016-08-10 MED ORDER — LACTULOSE 10 GM/15ML PO SOLN: 10.0000 g | Freq: Every day | ORAL | 0 refills | Status: DC

## 2016-08-10 MED ORDER — FUROSEMIDE 20 MG PO TABS: 20.0000 mg | ORAL_TABLET | Freq: Every day | ORAL | Status: DC

## 2016-08-10 MED ORDER — SPIRONOLACTONE 50 MG PO TABS: 50.0000 mg | ORAL_TABLET | Freq: Every day | ORAL | Status: DC

## 2016-08-10 NOTE — NC FL2 (Signed)
[place] LEVEL OF CARE SCREENING TOOL     IDENTIFICATION  Patient Name: Courtney Thornton Birthdate: 1965-11-15 Sex: female Admission Date (Current Location): 08/06/2016  [facility] and [place] Number:  Manufacturing engineer and Address:  [facility], [place] [address] [place], [place] [address]      Provider Number: [id number]  Attending Physician Name and Address:  Verlee Monte, MD  Relative Name and Phone Number:  Murrell Pavey (Spouse) [phone]    Current Level of Care: Hospital Recommended Level of Care: [place] Prior Approval Number:    Date Approved/Denied:   PASRR Number: [id number]  Discharge Plan: SNF    Current Diagnoses: Patient Active Problem List   Diagnosis Date Noted  . Leukocytosis 08/06/2016  . Hypokalemia 07/31/2016  . Hyponatremia 07/31/2016  . Alcohol abuse 07/31/2016  . Anemia 07/31/2016  . Diarrhea 07/31/2016  . Protein-calorie malnutrition, severe ([place]) 07/31/2016  . HTN (hypertension) 07/31/2016  . GERD (gastroesophageal reflux disease) 07/31/2016    Orientation RESPIRATION BLADDER Height & Weight     Self, Time, Situation, Place  Normal Continent Weight: 85 lb 6.4 oz (38.7 kg) Height:  4\' 11"  (149.9 cm)  BEHAVIORAL SYMPTOMS/MOOD NEUROLOGICAL BOWEL NUTRITION STATUS      Incontinent Diet (Heart Healthy; Thin Liquids)  AMBULATORY STATUS COMMUNICATION OF NEEDS Skin   Did not ambulate with PT on 08/09/2016 Verbally Other (Comment) (MSAD; Ecchymosis on buttocks)                       Personal Care Assistance Level of Assistance  Bathing, Feeding, Dressing Bathing Assistance: Maximum assistance Feeding assistance: Limited assistance (Needs setup) Dressing Assistance: Maximum assistance     Functional Limitations Info  Sight, Hearing, Speech Sight Info: Adequate Hearing Info: Adequate Speech Info: Adequate    SPECIAL CARE FACTORS FREQUENCY  PT (By licensed PT), OT  (By licensed OT)     PT Frequency: Min 3X/week - PT Eval completed on 08/09/2016 OT Frequency: Min 2X/week - OT Eval completed on 08/09/2016            Contractures Contractures Info: Not present    Additional Factors Info    Code Status Info: Full Code Allergies Info: NKDA           Current Medications (08/10/2016):  This is the current hospital active medication list Current Facility-Administered Medications  Medication Dose Route Frequency Provider Last Rate Last Dose  . acetaminophen (TYLENOL) tablet 650 mg  650 mg Oral Q6H PRN Toy Baker, MD       Or  . acetaminophen (TYLENOL) suppository 650 mg  650 mg Rectal Q6H PRN Toy Baker, MD      . albuterol (PROVENTIL) (2.5 MG/3ML) 0.083% nebulizer solution 2.5 mg  2.5 mg Inhalation Q6H PRN Toy Baker, MD      . cefTRIAXone (ROCEPHIN) 1 g in dextrose 5 % 50 mL IVPB  1 g Intravenous Q24H Bonnielee Haff, MD   1 g at 08/09/16 1222  . feeding supplement (ENSURE ENLIVE) (ENSURE ENLIVE) liquid 237 mL  237 mL Oral BID BM Anastassia Doutova, MD   237 mL at 08/08/16 1000  . folic acid (FOLVITE) tablet 1 mg  1 mg Oral Daily Ripudeep K Rai, MD   1 mg at 08/10/16 1054  . HYDROcodone-acetaminophen (NORCO/VICODIN) 5-325 MG per tablet 1-2 tablet  1-2 tablet Oral Q4H PRN Toy Baker, MD      . lactulose (CHRONULAC) 10 GM/15ML solution 10  g  10 g Oral Daily Bonnielee Haff, MD   10 g at 08/10/16 1055  . LORazepam (ATIVAN) tablet 1 mg  1 mg Oral Q6H PRN Ripudeep Krystal Eaton, MD   1 mg at 08/09/16 0301   Or  . LORazepam (ATIVAN) injection 1 mg  1 mg Intravenous Q6H PRN Ripudeep K Rai, MD      . LORazepam (ATIVAN) tablet 0-4 mg  0-4 mg Oral Q12H Ripudeep K Rai, MD      . multivitamin with minerals tablet 1 tablet  1 tablet Oral Daily Ripudeep Krystal Eaton, MD   1 tablet at 08/10/16 1050  . ondansetron (ZOFRAN) tablet 4 mg  4 mg Oral Q6H PRN Toy Baker, MD       Or  . ondansetron (ZOFRAN) injection 4 mg  4 mg Intravenous Q6H  PRN Toy Baker, MD   4 mg at 08/07/16 1204  . phosphorus (K PHOS NEUTRAL) tablet 500 mg  500 mg Oral TID Bonnielee Haff, MD   500 mg at 08/10/16 1051  . sertraline (ZOLOFT) tablet 50 mg  50 mg Oral Daily Toy Baker, MD   50 mg at 08/10/16 1047  . sodium chloride flush (NS) 0.9 % injection 3 mL  3 mL Intravenous Q12H Toy Baker, MD   3 mL at 08/10/16 1055  . thiamine (VITAMIN B-1) tablet 100 mg  100 mg Oral Daily Ripudeep K Rai, MD   100 mg at 08/10/16 1047     Discharge Medications: Please see discharge summary for a list of discharge medications.  Relevant Imaging Results:  Relevant Lab Results:   Additional Information SSN:  [id number]  Lajoyce Lauber Work [phone]

## 2016-08-10 NOTE — Progress Notes (Addendum)
Report given to [place], [place] Nurse. PTAR being notified.  18:00 Patient discharged to [facility]. IV removed. Telemetry removed. Belongings with patient. Transport Picked up patient in stable condition.  Sheliah Plane RN

## 2016-08-10 NOTE — Clinical Social Work Note (Signed)
Clinical Social Work Assessment  Patient Details  Name: Courtney Thornton MRN: [id number] Date of Birth: July 31, 1966  Date of referral:  08/10/16               Reason for consult:  Facility Placement                Permission sought to share information with:  Family Supports Permission granted to share information::  No (Patient is not oriented to situation)  Name::     Minerva Ends  Agency::     Relationship::  Spouse  Contact Information:  [phone]  Housing/Transportation Living arrangements for the past 2 months:  Single Family Home Source of Information:  Spouse Patient Interpreter Needed:  None Criminal Activity/Legal Involvement Pertinent to Current Situation/Hospitalization:  No - Comment as needed Significant Relationships:  Spouse Lives with:  Spouse Do you feel safe going back to the place where you live?  No Need for family participation in patient care:  Yes (Comment)  Care giving concerns:  Patient's spouse is in agreement that short term rehab will be needed for his wife after hospitalization.   Social Worker assessment / plan:  CSW Intern spoke with patient's spouse - Lashaune Freimark about discharge plans.  Herbie Baltimore stated that he did not want his wife to discharge to [place], however he was open to bed search in [place].  Bed search was initiated in [facility] on 08/09/2016 and Mr. Callicoat chose a bed to [facility] Rehab.  CSW Intern explained the discharge process to Mr. Hetman and he stated that he was on his way to the hospital .  [facility] Intern will stop by the room to confirm discharge plans with him at that time.      Employment status:  Disabled (Comment on whether or not currently receiving Disability) Insurance information:  Medicare PT Recommendations:  [place] / Referral to community resources:  [place]  Patient/Family's Response to care:  Patient's husband did  not address any concerns during his wife's hospital stay.   Patient/Family's Understanding of and Emotional Response to Diagnosis, Current Treatment, and Prognosis:  Not discussed  Emotional Assessment Appearance:  Appears stated age Attitude/Demeanor/Rapport:  Unable to Assess (Patient was asleep) Affect (typically observed):  Unable to Assess (Patient was asleep) Orientation:  Oriented to Self, Oriented to Place, Oriented to  Time Alcohol / Substance use:  Alcohol Use Psych involvement (Current and /or in the community):  No (Comment)  Discharge Needs  Concerns to be addressed:  Substance Abuse Concerns, Discharge Planning Concerns Readmission within the last 30 days:  Yes Current discharge risk:  Other (weakness) Barriers to Discharge:  No Barriers Identified   Linward Headland, [place] Work 08/10/2016, 3:39 PM

## 2016-08-10 NOTE — Discharge Summary (Signed)
Physician Discharge Summary  Courtney Thornton [id number] DOB: 07/12/1966 DOA: 08/06/2016  PCP: [place] date: 08/06/2016 Discharge date: 08/10/2016  Admitted From: Home Disposition: SNF  Recommendations for Outpatient Follow-up:  1. Follow up with PCP in 1-2 weeks 2. Please obtain BMP/CBC in one week  Home Health: NA Equipment/Devices:NA  Discharge Condition: Stable CODE STATUS: Full Code Diet recommendation: Diet Heart Room service appropriate? Yes; Fluid consistency: Thin Diet - low sodium heart healthy  Brief/Interim Summary: 50 y.o.femalewith medical history significant of alcohol abuse, anxiety, depression, GERD, HLD, and HTN, who was recently discharged from [facility]. She was admitted there with complaints of bilateral foot pain and swelling. Patient was noted to have electrolyte imbalances. She was noted to be hypokalemic and hyponatremic. These abnormalities were corrected. Patient was given Lasix with improvement in the swelling. Alcohol abuse. She was also thought to be playing a role in her presentation. Patient was seen by physical therapy and plan was for her to go to skilled nursing facility. However, patient declined. In the end and went home instead. She came back to the hospital here with complains of foot pain. Patient is a very poor historian. Patient found to have severe hypokalemia. She was hospitalized for further management.  Discharge Diagnoses:  Active Problems:   Hypokalemia   Alcohol abuse   Anemia   Diarrhea   Protein-calorie malnutrition, severe (HCC)   HTN (hypertension)   GERD (gastroesophageal reflux disease)   Leukocytosis    Hypokalemia and hypophosphatemia. Potassium level were down to 2.6 This is likely secondary to alcoholic kaliuresis and hypomagnesemia  Repleted aggressively with oral supplements, magnesium is 2.  Headache. CT head did not show any acute findings. Does not  appear to be in any discomfort at this time.  This is resolved.  Mild acute encephalopathy Patient appears to be distracted and confused at times.  Ammonia level was noted to be elevated. She was given lactulose, but she is experiencing diarrhea as a result.  Cannot determine clinically this is hepatic encephalopathy versus other metabolic encephalopathy. She is awake, alert but sometimes she gets disoriented.  Abdominal Tenderness/chronic liver disease likely early cirrhosis Ultrasound of the abdomen did show liver disease. Gallstones were noted.  Mild thickening of the gallbladder wall was seen, however. Murphy sign was negative.  RUQ tenderness, she has hepatosplenomegaly, low albumin, suggesting early cirrhosis. Negative hepatitis panel, her liver is palpable, and CT scan showed early cirrhosis and moderate pelvic ascites. Started on low-dose of Lasix and 50 mg of spironolactone.  Left foot pain. She had recent evaluation for the same at [facility]. X-ray on 11/18 did not show any acute fractures. Old left fibular avulsion fracture. Negative Doppler study. TE recommended SNF, initially she was not agreeable but family wants her to go there.  Hyperammonemia. Could have a component of hepatic encephalopathy. Developed severe diarrhea with lactulose, dose decreased to 15 mL a day.  History of alcohol abuse with possible hepatomegaly and abnormal LFTs. Gallstones identified on ultrasound. Murphy's sign was negative.  CIWA protocol initiated but patient last drink was about 11-12 days, unlikely to develop withdrawal symptoms or DTs.  Abnormal UA with leukocytosis/possible UTI. Initiated ceftriaxone. Urine culture.  Severe protein calorie malnutrition. Ensure.  Normocytic anemia Vitamin B-12 level was 2700. Ferritin was 621 recently. Folic acid level 6.8. Hemoglobin is stable Likely secondary to chronic alcohol use.   Discharge Instructions  Discharge  Instructions    Diet - low  sodium heart healthy    Complete by:  As directed    Increase activity slowly    Complete by:  As directed        Medication List    STOP taking these medications   ibuprofen 400 MG tablet Commonly known as:  ADVIL,MOTRIN     TAKE these medications   acetaminophen 325 MG tablet Commonly known as:  TYLENOL Take 2 tablets (650 mg total) by mouth every 6 (six) hours as needed for mild pain (or Fever >/= 101).   albuterol 108 (90 Base) MCG/ACT inhaler Commonly known as:  PROVENTIL HFA;VENTOLIN HFA Inhale 2 puffs into the lungs every 6 (six) hours as needed. For shortness of breath   feeding supplement (ENSURE ENLIVE) Liqd Take 237 mLs by mouth 2 (two) times daily between meals.   folic acid 1 MG tablet Commonly known as:  FOLVITE Take 1 tablet (1 mg total) by mouth daily.   furosemide 20 MG tablet Commonly known as:  LASIX Take 1 tablet (20 mg total) by mouth daily.   lactulose 10 GM/15ML solution Commonly known as:  CHRONULAC Take 15 mLs (10 g total) by mouth daily. Start taking on:  08/11/2016   metoprolol tartrate 25 MG tablet Commonly known as:  LOPRESSOR Take 25 mg by mouth 2 (two) times daily as needed (high blood pressure).   multivitamin with minerals Tabs tablet Take 1 tablet by mouth daily.   nitroGLYCERIN 0.4 MG SL tablet Commonly known as:  NITROSTAT Place 0.4 mg under the tongue every 5 (five) minutes as needed. For chest pain   ondansetron 4 MG tablet Commonly known as:  ZOFRAN Take 1 tablet (4 mg total) by mouth every 6 (six) hours. As needed for nausea or vomiting   ranitidine 150 MG tablet Commonly known as:  ZANTAC Take 150 mg by mouth daily as needed for heartburn.   sertraline 50 MG tablet Commonly known as:  ZOLOFT Take 50 mg by mouth daily as needed. depression   spironolactone 50 MG tablet Commonly known as:  ALDACTONE Take 1 tablet (50 mg total) by mouth daily.       No Known  Allergies  Consultations: None  Procedures (Echo, Carotid, EGD, Colonoscopy, ERCP)   Radiological studies: Dg Chest 2 View  Result Date: 08/06/2016 CLINICAL DATA:  Acute onset of leukocytosis.  Initial encounter. EXAM: CHEST  2 VIEW COMPARISON:  Chest radiograph performed 07/31/2016 FINDINGS: The lungs are well-aerated. Minimal right midlung and left basilar opacities likely reflect atelectasis. There is no evidence of pleural effusion or pneumothorax. The heart is normal in size; the mediastinal contour is within normal limits. No acute osseous abnormalities are seen. Cervical fusion hardware is partially imaged. IMPRESSION: Minimal right midlung and left basilar opacities likely reflect atelectasis. Electronically Signed   By: Garald Balding M.D.   On: 08/06/2016 22:36   Dg Chest 2 View  Result Date: 07/31/2016 CLINICAL DATA:  Chest pain, vomiting for several days. EXAM: CHEST  2 VIEW COMPARISON:  11/22/2011 FINDINGS: Heart and mediastinal contours are within normal limits. No focal opacities or effusions. No acute bony abnormality. IMPRESSION: No active cardiopulmonary disease. Electronically Signed   By: Rolm Baptise M.D.   On: 07/31/2016 12:43   Dg Ankle Complete Left  Result Date: 07/31/2016 CLINICAL DATA:  Left foot and ankle pain for the past 2 weeks. No known injury. EXAM: LEFT ANKLE COMPLETE - 3+ VIEW COMPARISON:  Left foot radiographs - earlier same day. FINDINGS: There is rather diffuse  soft tissue swelling about the ankle and hindfoot, worse laterally. There is an age-indeterminate punctate (approximately 0.5 cm) ossicle adjacent to the tip of the fibular which may represent an age-indeterminate avulsion fracture. Otherwise, no fracture or dislocation. Joint spaces are preserved. Ankle mortise is preserved. No definite ankle joint effusion. No plantar calcaneal spur. IMPRESSION: Diffuse soft tissue swelling about the ankle and hindfoot with ossicle adjacent to the tip of the fibula  likely representative of an age-indeterminate avulsion fracture. Correlation for point tenderness at this location is recommended. Electronically Signed   By: Sandi Mariscal M.D.   On: 07/31/2016 12:58   Ct Head Wo Contrast  Result Date: 08/07/2016 CLINICAL DATA:  Headache EXAM: CT HEAD WITHOUT CONTRAST TECHNIQUE: Contiguous axial images were obtained from the base of the skull through the vertex without intravenous contrast. COMPARISON:  None. FINDINGS: Brain: No intracranial hemorrhage, mass effect or midline shift. Mild cerebral atrophy. Mild periventricular white matter decreased attenuation probable due to chronic small vessel ischemic changes. No acute cortical infarction. No mass lesion is noted on this unenhanced scan. Vascular: Mild atherosclerotic calcifications of carotid siphon. Skull: No skull fracture is noted. Sinuses/Orbits: No acute findings Other: None IMPRESSION: No acute intracranial abnormality. Mild cerebral atrophy. Mild periventricular white matter decreased attenuation probable due to chronic small vessel ischemic changes. Electronically Signed   By: Lahoma Crocker M.D.   On: 08/07/2016 20:05   US Abdomen Complete  Result Date: 08/07/2016 CLINICAL DATA:  Abdominal pain for 1 week, increased LFTs EXAM: ABDOMEN ULTRASOUND COMPLETE COMPARISON:  CT scan 09/13/2008 FINDINGS: Gallbladder: Gallstones and sludge noted within gallbladder. No sonographic Murphy's sign. There is mild thickening of gallbladder wall up to 3.6 mm. Common bile duct: Diameter: 8.7 mm in diameter prominent in size Liver: There is diffuse increased echogenicity of the liver with nodular contour. Chronic liver disease or early cirrhosis cannot be excluded. Clinical correlation is necessary. No focal hepatic mass. IVC: No abnormality visualized. Pancreas: Not seen due to bowel gas Spleen: Size and appearance within normal limits. Measures 4.7 cm in length Right Kidney: Length: 11.4 cm. Echogenicity within normal limits. No  mass or hydronephrosis visualized. Left Kidney: Length: 11.2 cm. Echogenicity within normal limits. No mass or hydronephrosis visualized. Abdominal aorta: No aneurysm visualized. Measures up to 2.2 cm in diameter. Other findings: Perihepatic ascites is noted. IMPRESSION: 1. Small gallstones and layering sludge noted within gallbladder. There is mild thickening of gallbladder wall up to 3.6 mm without sonographic Murphy's sign. 2. There is diffuse increased echogenicity of the liver with nodular contour suspicious for chronic liver disease or cirrhosis. Perihepatic ascites is noted. No focal hepatic mass. 3. No hydronephrosis. 4. No aortic aneurysm. Electronically Signed   By: Lahoma Crocker M.D.   On: 08/07/2016 18:38   Ct Abdomen Pelvis W Contrast  Result Date: 08/08/2016 CLINICAL DATA:  History of alcohol abuse gallbladder problems and liver disease weight loss EXAM: CT ABDOMEN AND PELVIS WITH CONTRAST TECHNIQUE: Multidetector CT imaging of the abdomen and pelvis was performed using the standard protocol following bolus administration of intravenous contrast. CONTRAST:  75 cc Isovue 300 intravenous COMPARISON:  Ultrasound 08/07/2016.  CT scan 09/13/2008 FINDINGS: Lower chest: Small bilateral right greater than left pleural effusions. Passive atelectasis within the posterior lung bases. Normal heart size. Hepatobiliary: Liver enlarged with right lobe length craniocaudad measurement of 22 cm. There is diffuse decreased density of the liver parenchyma with scattered areas is increased density, likely indicating fatty infiltration and probable areas of sparing. Questionable  ring-enhancing lesion within the posterior right hepatic lobe measuring 1.8 cm, series 2, image number 16. There is no biliary dilatation. The gallbladder is slightly enlarged and contains multiple calcified stones. Pancreas: Unremarkable. No pancreatic ductal dilatation or surrounding inflammatory changes. Spleen: Normal in size without focal  abnormality. Adrenals/Urinary Tract: Diffuse enlargement of left adrenal gland without discrete mass. Right adrenal gland is normal. Kidneys show no hydronephrosis or evidence for mass. Areas of increased density in the central right kidney likely relate to contrast extrusion into collecting system. Bladder appears within normal limits. Stomach/Bowel: There is nonspecific colon wall edema and thickening involving the right colon and hepatic flexure. No evidence for dilated small bowel to suggest obstruction. Vascular/Lymphatic: Atherosclerotic calcifications of non aneurysmal aorta. Nonspecific sub cm retroperitoneal lymph nodes. Reproductive: Uterus and adnexal structures are not identified. Other: Moderate pelvic ascites. Small free fluid in the upper abdomen. No free air. Musculoskeletal: Degenerative changes of the spine. No acute osseous abnormality. IMPRESSION: 1. Enlarged heterogenous low-attenuation liver compatible with fatty infiltration or early cirrhotic changes. Possible 1.8 cm rim enhancing mass or nodule within the posterior right hepatic lobe. Suggest nonemergent MRI evaluation. 2. Slightly dilated gallbladder containing multiple calcified stones. No biliary dilatation. 3. Nonspecific colon wall thickening involving ascending colon and hepatic flexure, could relate to mild colitis. 4. Small abdominal ascites, moderate pelvic ascites, and small bilateral effusions. Electronically Signed   By: Donavan Foil M.D.   On: 08/08/2016 23:09   US Venous Img Lower Bilateral  Result Date: 08/01/2016 CLINICAL DATA:  50 year old female with a history of bilateral lower extremity swelling EXAM: BILATERAL LOWER EXTREMITY VENOUS DOPPLER ULTRASOUND TECHNIQUE: Gray-scale sonography with graded compression, as well as color Doppler and duplex ultrasound were performed to evaluate the lower extremity deep venous systems from the level of the common femoral vein and including the common femoral, femoral, profunda  femoral, popliteal and calf veins including the posterior tibial, peroneal and gastrocnemius veins when visible. The superficial great saphenous vein was also interrogated. Spectral Doppler was utilized to evaluate flow at rest and with distal augmentation maneuvers in the common femoral, femoral and popliteal veins. COMPARISON:  None. FINDINGS: RIGHT LOWER EXTREMITY Common Femoral Vein: No evidence of thrombus. Normal compressibility, respiratory phasicity and response to augmentation. Saphenofemoral Junction: No evidence of thrombus. Normal compressibility and flow on color Doppler imaging. Profunda Femoral Vein: No evidence of thrombus. Normal compressibility and flow on color Doppler imaging. Femoral Vein: No evidence of thrombus. Normal compressibility, respiratory phasicity and response to augmentation. Popliteal Vein: No evidence of thrombus. Normal compressibility, respiratory phasicity and response to augmentation. Calf Veins: No evidence of thrombus. Normal compressibility and flow on color Doppler imaging. Superficial Great Saphenous Vein: No evidence of thrombus. Normal compressibility and flow on color Doppler imaging. Other Findings:  None. LEFT LOWER EXTREMITY Common Femoral Vein: No evidence of thrombus. Normal compressibility, respiratory phasicity and response to augmentation. Saphenofemoral Junction: No evidence of thrombus. Normal compressibility and flow on color Doppler imaging. Profunda Femoral Vein: No evidence of thrombus. Normal compressibility and flow on color Doppler imaging. Femoral Vein: No evidence of thrombus. Normal compressibility, respiratory phasicity and response to augmentation. Popliteal Vein: No evidence of thrombus. Normal compressibility, respiratory phasicity and response to augmentation. Calf Veins: No evidence of thrombus. Normal compressibility and flow on color Doppler imaging. Superficial Great Saphenous Vein: No evidence of thrombus. Normal compressibility and flow  on color Doppler imaging. Other Findings:  None. IMPRESSION: Sonographic survey of the bilateral lower extremities negative for DVT. Signed,  Dulcy Fanny. Earleen Newport, DO [facility] Electronically Signed   By: Corrie Mckusick D.O.   On: 08/01/2016 10:50   Dg Foot Complete Left  Result Date: 08/05/2016 CLINICAL DATA:  Left foot pain EXAM: LEFT FOOT - COMPLETE 3+ VIEW COMPARISON:  05/28/2014 FINDINGS: No fracture or subluxation is evident. Old fracture deformity involving the proximal fourth proximal phalanx. There are patchy areas of osteopenia involving the distal fifth metatarsal and proximal phalanx with periarticular osteopenia of the first through fifth MTP joints. IMPRESSION: 1. No definite acute displaced fracture or malalignment 2. Patchy areas of osteopenia, involving the digits of the left foot, findings could be secondary to disuse osteopenia, infection, or early manifestation of inflammatory arthropathy. Clinical correlation is recommended. Electronically Signed   By: Donavan Foil M.D.   On: 08/05/2016 20:51   Dg Foot Complete Left  Result Date: 07/31/2016 CLINICAL DATA:  Bilateral lower extremity edema. Left foot and ankle pain for the past 2 weeks. No known injury. EXAM: LEFT FOOT - COMPLETE 3+ VIEW COMPARISON:  Left ankle radiographs-earlier same day. FINDINGS: There is diffuse soft tissue swelling about the lower leg and hindfoot. No subcutaneous emphysema or radiopaque foreign body. A punctate ossicle is noted adjacent to the distal end of the fibula. Otherwise, no acute fracture or dislocation. Joint spaces are preserved. No significant hallux valgus deformity. There is rather diffuse periarticular demineralization, most conspicuously involving the proximal phalanx of the great digit as well as the distal end of the fifth metatarsal along with the distal end proximal phalanx of the fifth digit and to a lesser extent the second through fourth  metatarsal heads. No definite erosions. No discrete areas of osteolysis to suggest osteomyelitis. IMPRESSION: 1. Punctate ossicle adjacent to the distal end of the fibula likely representative of age-indeterminate avulsive injury. Correlation for point tenderness at this location is recommended. 2. Diffuse soft tissue swelling about the lower leg and hindfoot. 3. Rather diffuse periarticular demineralization, most conspicuously involving the fifth digit and proximal phalanx of the great toe, nonspecific though could be seen in the setting of a metabolic bone disease. Clinical correlation is advised. Electronically Signed   By: Sandi Mariscal M.D.   On: 07/31/2016 12:54   Dg Hip Unilat With Pelvis 2-3 Views Left  Result Date: 08/05/2016 CLINICAL DATA:  Left hip pain EXAM: DG HIP (WITH OR WITHOUT PELVIS) 2-3V LEFT COMPARISON:  04/29/2016 FINDINGS: There is no evidence of hip fracture or dislocation. There is no evidence of arthropathy or other focal bone abnormality. IMPRESSION: Negative. Electronically Signed   By: Donavan Foil M.D.   On: 08/05/2016 20:52    Subjective:  Discharge Exam: Vitals:   08/09/16 1900 08/09/16 2058 08/10/16 0557 08/10/16 0847  BP: 103/74 119/78 115/80 124/85  Pulse: (!) 105 (!) 101 (!) 103 (!) 108  Resp:  18 18 17   Temp: 98.6 F (37 C) 99.2 F (37.3 C) 97.7 F (36.5 C) 98.6 F (37 C)  TempSrc: Oral Oral Oral Oral  SpO2: 100% 100% 100% 100%  Weight:  38.7 kg (85 lb 6.4 oz)    Height:       General: Pt is alert, awake, not in acute distress Cardiovascular: RRR, S1/S2 +, no rubs, no gallops Respiratory: CTA bilaterally, no wheezing, no rhonchi Abdominal: Soft, NT, ND, bowel sounds + Extremities: no edema, no cyanosis   The results of significant diagnostics from this hospitalization (including imaging, microbiology, ancillary and laboratory) are listed below for reference.  Microbiology: Recent Results (from the past 240 hour(s))  Culture, Urine     Status:  Abnormal   Collection Time: 08/07/16  7:24 PM  Result Value Ref Range Status   Specimen Description URINE, RANDOM  Final   Special Requests NONE  Final   Culture <10,000 COLONIES/mL INSIGNIFICANT GROWTH (A)  Final   Report Status 08/08/2016 FINAL  Final     Labs: BNP (last 3 results)  Recent Labs  07/31/16 1958  BNP [id number]*   Basic Metabolic Panel:  Recent Labs Lab 08/06/16 1535 08/06/16 2249 08/07/16 0605 08/08/16 0732 08/08/16 0900 08/08/16 1957 08/09/16 0530  NA 135  --  139 140  --  131* 136  K 2.8*  --  3.3* 2.6*  --  4.4 4.1  CL 102  --  106 109  --  103 109  CO2 23  --  24 23  --  22 22  GLUCOSE 118*  --  98 84  --  98 91  BUN <5*  --  <5* <5*  --  <5* <5*  CREATININE 0.44  --  0.40* 0.32*  --  0.38* 0.33*  CALCIUM 8.5*  --  8.1* 7.7*  --  7.8* 8.0*  MG  --  2.2 1.9  --  1.8  --  2.0  PHOS  --  1.2* 1.0* 3.4  --   --   --    Liver Function Tests:  Recent Labs Lab 08/06/16 2249 08/07/16 0605 08/08/16 0732 08/09/16 0530  AST 61* 68* 57* 54*  ALT 37 36 32 32  ALKPHOS 258* 274* 230* 228*  BILITOT 1.3* 1.1 0.9 1.0  PROT 5.3* 5.6* 5.1* 5.6*  ALBUMIN 2.0* 2.1* 1.8* 2.0*   No results for input(s): LIPASE, AMYLASE in the last 168 hours.  Recent Labs Lab 08/07/16 1103 08/09/16 0530  AMMONIA 63* 39*   CBC:  Recent Labs Lab 08/06/16 1535 08/06/16 2249 08/07/16 0605 08/08/16 0732 08/09/16 0530  WBC 18.3* 16.2* 15.5* 16.3* 18.0*  NEUTROABS  --  11.5*  --   --   --   HGB 9.3* 9.3* 9.8* 8.7* 9.6*  HCT 28.1* 27.4* 29.3* 26.2* 29.6*  MCV 97.9 97.2 97.7 98.1 98.3  PLT 387 339 405* 376 354   Cardiac Enzymes: No results for input(s): CKTOTAL, CKMB, CKMBINDEX, TROPONINI in the last 168 hours. BNP: Invalid input(s): POCBNP CBG: No results for input(s): GLUCAP in the last 168 hours. D-Dimer No results for input(s): DDIMER in the last 72 hours. Hgb A1c No results for input(s): HGBA1C in the last 72 hours. Lipid Profile No results for input(s):  CHOL, HDL, LDLCALC, TRIG, CHOLHDL, LDLDIRECT in the last 72 hours. Thyroid function studies No results for input(s): TSH, T4TOTAL, T3FREE, THYROIDAB in the last 72 hours.  Invalid input(s): FREET3 Anemia work up No results for input(s): VITAMINB12, FOLATE, FERRITIN, TIBC, IRON, RETICCTPCT in the last 72 hours. Urinalysis    Component Value Date/Time   COLORURINE AMBER (A) 08/06/2016 2326   APPEARANCEUR CLOUDY (A) 08/06/2016 2326   LABSPEC 1.020 08/06/2016 2326   PHURINE 6.0 08/06/2016 2326   GLUCOSEU NEGATIVE 08/06/2016 2326   HGBUR NEGATIVE 08/06/2016 2326   BILIRUBINUR MODERATE (A) 08/06/2016 2326   KETONESUR 15 (A) 08/06/2016 2326   PROTEINUR NEGATIVE 08/06/2016 2326   UROBILINOGEN 0.2 09/13/2008 1359   NITRITE POSITIVE (A) 08/06/2016 2326   LEUKOCYTESUR SMALL (A) 08/06/2016 2326   Sepsis Labs Invalid input(s): PROCALCITONIN,  WBC,  LACTICIDVEN Microbiology Recent Results (from the past  240 hour(s))  Culture, Urine     Status: Abnormal   Collection Time: 08/07/16  7:24 PM  Result Value Ref Range Status   Specimen Description URINE, RANDOM  Final   Special Requests NONE  Final   Culture <10,000 COLONIES/mL INSIGNIFICANT GROWTH (A)  Final   Report Status 08/08/2016 FINAL  Final     Time coordinating discharge: Over 30 minutes  SIGNED:   Birdie Hopes, MD  [facility] 08/10/2016, 2:59 PM Pager   If 7PM-7AM, please contact night-coverage [email or web address] Password [email or web address]

## 2016-08-10 NOTE — Care Management Note (Signed)
Case Management Note  Patient Details  Name: Courtney Thornton MRN: [id number] Date of Birth: 02-Oct-1965  Subjective/Objective:            CM following for progression and d/c planning.         Action/Plan: 08/10/2016 Plan to d/c to SNF, no CM needs.   Expected Discharge Date:                  Expected Discharge Plan:  [place]  In-House Referral:  Clinical Social Work  Discharge planning Services  NA  Post Acute Care Choice:  NA Choice offered to:  NA  DME Arranged:   NA DME Agency:   NA  HH Arranged:   NA HH Agency:   NA  Status of Service:  In process, will continue to follow  If discussed at Long Length of Stay Meetings, dates discussed:    Additional Comments:  Adron Bene, RN 08/10/2016, 3:05 PM

## 2016-08-10 NOTE — Care Management Note (Signed)
Case Management Note  Patient Details  Name: Courtney Thornton MRN: [id number] Date of Birth: 12-19-1965  Subjective/Objective:       CM following for progression and d/c planning.             Action/Plan: 08/10/2016 Pt for d/c to SNF today, pt husband has selected [facility]. No HH or DME needs at this time.   Expected Discharge Date:     08/10/2016             Expected Discharge Plan:  [place]  In-House Referral:  Clinical Social Work  Discharge planning Services  NA  Post Acute Care Choice:  NA Choice offered to:  NA  DME Arranged:   NA DME Agency:   NA  HH Arranged:   NA HH Agency:   NA  Status of Service:  Completed, signed off  If discussed at [employer] of Stay Meetings, dates discussed:    Additional Comments:  Adron Bene, RN 08/10/2016, 3:17 PM

## 2016-08-10 NOTE — Clinical Social Work Placement (Signed)
   CLINICAL SOCIAL WORK PLACEMENT  NOTE 08/10/2016 - PATIENT DISCHARGED TO [facility]  Date:  08/10/2016  Patient Details  Name: ESTHELA ZETTEL MRN: [id number] Date of Birth: [birth year]  Clinical Social Work is seeking post-discharge placement for this patient at the [place] level of care (*CSW will initial, date and re-position this form in  chart as items are completed):  Yes   Patient/family provided with [facility] Work Department's list of facilities offering this level of care within the geographic area requested by the patient (or if unable, by the patient's family).  Yes   Patient/family informed of their freedom to choose among providers that offer the needed level of care, that participate in Medicare, Medicaid or managed care program needed by the patient, have an available bed and are willing to accept the patient.  Yes   Patient/family informed of [facility]'s ownership interest in [facility] and [facility], as well as of the fact that they are under no obligation to receive care at these facilities.  PASRR submitted to EDS on 08/04/16     PASRR number received on 08/04/16     Existing PASRR number confirmed on       FL2 transmitted to all facilities in geographic area requested by pt/family on       FL2 transmitted to all facilities within larger geographic area on 08/09/16     Patient informed that his/her managed care company has contracts with or will negotiate with certain facilities, including the following:        Yes   Patient/family informed of bed offers received.  Patient chooses bed at [place] recommends and patient chooses bed at      Patient to be transferred to [facility] on 08/10/16.  Patient to be transferred to facility by Ambulance [facility])     Patient family notified on 08/10/16 of transfer.  Name of family member notified:   Minerva Ends (Spouse)     PHYSICIAN       Additional Comment:    _______________________________________________ Linward Headland, Student-Social Work 08/10/2016, 3:53 PM

## 2016-08-10 NOTE — Progress Notes (Signed)
Patient is medically stable for discharge and will be transferred to [facility] and Rehab today by ambulance.  No other social work needs identified.    Linward Headland, CSW Intern

## 2016-08-12 ENCOUNTER — Non-Acute Institutional Stay (SKILLED_NURSING_FACILITY): Payer: Medicare Other | Admitting: Internal Medicine

## 2016-08-12 ENCOUNTER — Encounter: Payer: Self-pay | Admitting: Internal Medicine

## 2016-08-12 DIAGNOSIS — F1010 Alcohol abuse, uncomplicated: Secondary | ICD-10-CM | POA: Diagnosis not present

## 2016-08-12 DIAGNOSIS — E43 Unspecified severe protein-calorie malnutrition: Secondary | ICD-10-CM | POA: Diagnosis not present

## 2016-08-12 DIAGNOSIS — R194 Change in bowel habit: Secondary | ICD-10-CM

## 2016-08-12 DIAGNOSIS — R410 Disorientation, unspecified: Secondary | ICD-10-CM

## 2016-08-12 DIAGNOSIS — R198 Other specified symptoms and signs involving the digestive system and abdomen: Secondary | ICD-10-CM | POA: Insufficient documentation

## 2016-08-12 DIAGNOSIS — F05 Delirium due to known physiological condition: Secondary | ICD-10-CM | POA: Diagnosis not present

## 2016-08-12 DIAGNOSIS — R627 Adult failure to thrive: Secondary | ICD-10-CM | POA: Insufficient documentation

## 2016-08-12 DIAGNOSIS — K219 Gastro-esophageal reflux disease without esophagitis: Secondary | ICD-10-CM

## 2016-08-12 NOTE — Progress Notes (Signed)
[facility] Room: [place] [facility] [place] [address] [place] [address]  Code Status: Full Code   This is a comprehensive admission note to [facility] performed on this date less than 30 days from date of admission. Included are preadmission medical/surgical history;reconciled medication list; family history; social history and comprehensive review of systems.  Corrections and additions to the records were documented . Comprehensive physical exam was also performed. Additionally a clinical summary was entered for each active diagnosis pertinent to this admission in the Problem List to enhance continuity of care.   HPI: The patient was hospitalized 11/24-11/28/17; she presented with complaints of foot pain; but she was admitted for severe hypokalemia Apparently she had been discharged from [facility] in the recent past, she was treated for bilateral foot pain and swelling . She had hypokalemia and hyponatremia which were resolved. Labs 08/09/16 revealed a potassium of 4.1 and magnesium of 2.0. Renal function was normal. TSH was high normal at 4.73. B12 level was supranormal at 2794 on 11/18. Urine culture 11/25 revealed less than 10,000 colonies. Problematic is a history of significant alcohol abuse, anxiety, and depression. She was moved to the SNF for physical therapy  Past medical and surgical history: Past history includes protein caloric malnutrition, hypertension, GERD with gastric ulcer, dyslipidemia, history renal calculi and leukocytosis. She has a history of cervical cancer .She has had cervical cone biopsy and hysterectomy. Also had anterior cervical decompression/discectomy fusion  Social history: She states she quit drinking 3-4 weeks ago. When asked why she quit drinking, she mentioned her grandchildren.  Family history: Reviewed  Review of systems: Veracity of answers is somewhat questionable as she is  somewhat confused. Date given as 08/30/2017. Present was identified as Doctor, general practice. Her main complaints are gastrointestinal. She describes intermittent nausea for 2 months. She's had poor appetite with significant weight loss. She has occasional diarrhea and occasional stool urgency. Also describes dysphagia if she eats quickly & does not chew her food well. She has had some intermittent dysuria. Her responses ask a somewhat vague as to whether this is persistent or not She states that she has anxiety and depression. She states her husband describes some snoring, not excessive.   Constitutional: No fever,significant   Cardiovascular: No chest pain, palpitations,paroxysmal nocturnal dyspnea, claudication, edema  Respiratory: No cough, sputum production,hemoptysis, DOE , significant snoring,apnea   Genitourinary: No hematuria, pyuria,  incontinence, nocturia Musculoskeletal: No joint stiffness, joint swelling, weakness,pain mentioned today Dermatologic: No rash, pruritus, change in appearance of skin Neurologic: No dizziness,headache,syncope, seizures, numbness , tingling Endocrine: No change in hair/skin/ nails, excessive thirst, excessive hunger, excessive urination  Hematologic/lymphatic: No significant bruising, lymphadenopathy,abnormal bleeding  Physical exam:  Pertinent or positive findings: the most striking finding is that she is very thin but not cachectic.  There is a slight yellow tint to her eyes without frank jaundice. There is slight asymmetry of the nasolabial folds. The left is decreased. There is irregular slight hyperpigmentation over the malar areas  An S4 is present. Breath sounds are decreased in the upper lobes. Initially she had minor rhonchi at the bases which cleared with deep inspirations. Clubbing of the nailbeds is present. She's generally weak, slightly more so on the left.   General appearance: no acute distress , increased work of breathing is present.   Lymphatic: No  lymphadenopathy about the head, neck, axilla . Eyes: No conjunctival inflammation or lid edema is present.  Ears:  External ear exam shows no significant lesions or deformities.   Nose:  External nasal examination shows no deformity or inflammation. Nasal mucosa are pink and moist without lesions ,exudates Oral exam: lips and gums are healthy appearing.There is no oropharyngeal erythema or exudate . Neck:  No thyromegaly, masses, tenderness noted.    Heart:  Normal rate and regular rhythm. S1 and S2 normal without  murmur, click, rub .  Abdomen:Bowel sounds are normal. Abdomen is soft but slightly tender with no organomegaly, hernias,masses. GU: deferred. Extremities:  No cyanosis, clubbing,edema  Neurologic exam : Deep tendon reflexes are equal Skin: Warm & dry w/o tenting. No significant lesions or rash.  See clinical summary under each active problem in the Problem List with associated updated therapeutic plan

## 2016-08-12 NOTE — Assessment & Plan Note (Addendum)
Nutrition consult to address hypoalbuminemia

## 2016-08-12 NOTE — Assessment & Plan Note (Signed)
Probiotic Lipase, amylase

## 2016-08-12 NOTE — Assessment & Plan Note (Signed)
Assess response to nutritional supplements, multivitamins, and folate

## 2016-08-12 NOTE — Assessment & Plan Note (Signed)
Patient states she quit drinking 3-4 weeks prior to admission 08/12/16 She states the main reason she quick drinking was for the benefit of her grandchildren

## 2016-08-12 NOTE — Assessment & Plan Note (Signed)
She has some dysphagia but it appears to be related to rate of food ingestion; it is not fixed

## 2016-08-12 NOTE — Assessment & Plan Note (Signed)
PT/OT Nutritional assessment

## 2016-08-12 NOTE — Patient Instructions (Signed)
See Current Assessment & Plan in Problem List under specific Diagnosis 

## 2016-08-17 ENCOUNTER — Non-Acute Institutional Stay (SKILLED_NURSING_FACILITY): Payer: Medicare Other | Admitting: Internal Medicine

## 2016-08-17 ENCOUNTER — Encounter: Payer: Self-pay | Admitting: Internal Medicine

## 2016-08-17 DIAGNOSIS — D649 Anemia, unspecified: Secondary | ICD-10-CM | POA: Diagnosis not present

## 2016-08-17 DIAGNOSIS — D72828 Other elevated white blood cell count: Secondary | ICD-10-CM | POA: Diagnosis not present

## 2016-08-17 LAB — CBC AND DIFFERENTIAL
HEMATOCRIT: 30 % — AB (ref 36–46)
HEMOGLOBIN: 9.5 g/dL — AB (ref 12.0–16.0)
Neutrophils Absolute: 14 /uL
Platelets: 342 10*3/uL (ref 150–399)

## 2016-08-17 LAB — BASIC METABOLIC PANEL
BUN: 7 mg/dL (ref 4–21)
Creatinine: 0.2 mg/dL — AB (ref 0.5–1.1)
Glucose: 102 mg/dL
Potassium: 3 mmol/L — AB (ref 3.4–5.3)
SODIUM: 141 mmol/L (ref 137–147)

## 2016-08-17 LAB — LIPID PANEL: LDL/HDL RATIO: 15.5

## 2016-08-17 NOTE — Progress Notes (Signed)
    Facility Location: [facility]  Room Number: 303 A   Code Status: Full Code  This is a nursing facility follow up for specific acute issue of persistent leukocytosis.  Interim medical record and care since last [place] visit was updated with review of diagnostic studies and change in clinical status since last visit were documented.  HPI: The white blood counts in Epic have ranged from 11,300-18,300 with slight increase in neutrophils since 07/31/16. Red cell morphology is described as having polychromasia, vacuolated neutrophils are also described. She exhibits a normochromic, normocytic anemia with hematocrits ranging from 25.2-29.6. Concern is possible infection. She does state that in the 1990s she was seen by "blood specialist" for elevation of the white count. Subsequently she stated that it may been an abnormality of platelets. She believes that she took some medication that time. Platelet counts in Epic have been normal. Hepatitis and HIV testing were negative while hospitalized  Review of systems:  She describes loose stool but she is on Chronulac. She describes some intermittent exertional dyspnea. She also describes easy bruising.  Vision has been blurred at times. She does describe intermittent dysuria. Urine culture in the hospital was negative for infection 08/08/16. She denies any other constitutional or localizing symptoms or signs of infection. Her blood Pressure has remained low necessitating adjusting dose of her antihypertensive medications  Constitutional: No fever,significant weight change, fatigue  Eyes: No redness, discharge, pain ENT/mouth: No nasal congestion,  purulent discharge, earache,change in hearing ,sore throat  Cardiovascular: No chest pain, palpitations,paroxysmal nocturnal dyspnea, claudication, edema  Respiratory: No cough, sputum production,hemoptysis  Gastrointestinal: No  heartburn,dysphagia,abdominal pain, nausea / vomiting,rectal bleeding, melena Genitourinary: No hematuria, pyuria,  incontinence, nocturia Musculoskeletal: No joint stiffness, joint swelling, weakness,pain Dermatologic: No rash, pruritus, change in appearance of skin Neurologic: No dizziness,headache,syncope, seizures, numbness , tingling Endocrine: No change in hair/skin/ nails, excessive thirst, excessive hunger, excessive urination  Hematologic/lymphatic: No lymphadenopathy,abnormal bleeding Allergy/immunology: No itchy/ watery eyes, significant sneezing, urticaria, angioedema  Physical exam:  Pertinent or positive findings:She is thin but does not appear ill.  She has increased cerumen in the otic canals.  There is dullness to percussion in the right upper quadrant. Clinically I cannot appreciate hepatosplenomegaly. Extremities are thin.   General appearance:Adequately nourished; no acute distress , increased work of breathing is present.   Lymphatic: No lymphadenopathy about the head, neck, axilla . Eyes: No conjunctival inflammation or lid edema is present. There is no scleral icterus. Ears:  External ear exam shows no significant lesions or deformities.   Nose:  External nasal examination shows no deformity or inflammation. Nasal mucosa are pink and moist without lesions ,exudates Oral exam: lips and gums are healthy appearing.There is no oropharyngeal erythema or exudate . Neck:  No thyromegaly, masses, tenderness noted.    Heart:  Normal rate and regular rhythm. S1 and S2 normal without gallop, murmur, click, rub .  Lungs:Chest clear to auscultation without wheezes, rhonchi,rales , rubs. Abdomen:Bowel sounds are normal. Abdomen is soft and nontender with no organomegaly, hernias,masses. GU: deferred  Extremities:  No cyanosis, clubbing,edema  Neurologic exam : Strength equal  in upper & lower extremities Deep tendon reflexes are equal Skin: Warm & dry w/o tenting. No  significant lesions or rash.    See summary under each active problem in the Problem List with associated updated therapeutic plan

## 2016-08-18 ENCOUNTER — Encounter: Payer: Self-pay | Admitting: Internal Medicine

## 2016-08-18 NOTE — Assessment & Plan Note (Addendum)
Attempt to review any prior Hematology evaluation Urinalysis, culture and sensitivity, C. difficile toxin on any liquid stool because of the history of dysuria and diarrhea Up to Date reviewed in reference to leukocytosis/neutrophilia

## 2016-08-18 NOTE — Assessment & Plan Note (Signed)
Anemia appears stable and not progressive

## 2016-08-18 NOTE — Patient Instructions (Signed)
See Current Assessment & Plan in Problem List under specific Diagnosis 

## 2016-09-01 ENCOUNTER — Non-Acute Institutional Stay (SKILLED_NURSING_FACILITY): Payer: Medicare Other | Admitting: Nurse Practitioner

## 2016-09-01 ENCOUNTER — Encounter: Payer: Self-pay | Admitting: Nurse Practitioner

## 2016-09-01 DIAGNOSIS — R410 Disorientation, unspecified: Secondary | ICD-10-CM

## 2016-09-01 DIAGNOSIS — R627 Adult failure to thrive: Secondary | ICD-10-CM

## 2016-09-01 DIAGNOSIS — F32A Depression, unspecified: Secondary | ICD-10-CM

## 2016-09-01 DIAGNOSIS — G8929 Other chronic pain: Secondary | ICD-10-CM

## 2016-09-01 DIAGNOSIS — F329 Major depressive disorder, single episode, unspecified: Secondary | ICD-10-CM

## 2016-09-01 DIAGNOSIS — E876 Hypokalemia: Secondary | ICD-10-CM | POA: Diagnosis not present

## 2016-09-01 DIAGNOSIS — F418 Other specified anxiety disorders: Secondary | ICD-10-CM | POA: Diagnosis not present

## 2016-09-01 DIAGNOSIS — F05 Delirium due to known physiological condition: Secondary | ICD-10-CM

## 2016-09-01 DIAGNOSIS — R197 Diarrhea, unspecified: Secondary | ICD-10-CM

## 2016-09-01 DIAGNOSIS — M25572 Pain in left ankle and joints of left foot: Secondary | ICD-10-CM | POA: Diagnosis not present

## 2016-09-01 DIAGNOSIS — F419 Anxiety disorder, unspecified: Secondary | ICD-10-CM

## 2016-09-01 DIAGNOSIS — F1010 Alcohol abuse, uncomplicated: Secondary | ICD-10-CM

## 2016-09-01 MED ORDER — SERTRALINE HCL 50 MG PO TABS: 50.0000 mg | ORAL_TABLET | Freq: Every day | ORAL | 0 refills | Status: DC

## 2016-09-01 MED ORDER — ALBUTEROL SULFATE HFA 108 (90 BASE) MCG/ACT IN AERS: 2.0000 | INHALATION_SPRAY | Freq: Four times a day (QID) | RESPIRATORY_TRACT | 0 refills | Status: AC | PRN

## 2016-09-01 MED ORDER — LACTULOSE 10 GM/15ML PO SOLN: 10.0000 g | Freq: Every day | ORAL | 0 refills | Status: DC

## 2016-09-01 MED ORDER — NITROGLYCERIN 0.4 MG SL SUBL: 0.4000 mg | SUBLINGUAL_TABLET | SUBLINGUAL | 0 refills | Status: AC | PRN

## 2016-09-01 MED ORDER — SPIRONOLACTONE 50 MG PO TABS: 50.0000 mg | ORAL_TABLET | Freq: Every day | ORAL | 0 refills | Status: DC

## 2016-09-01 NOTE — Progress Notes (Signed)
Nursing Home Location:  [facility]  Place of Service: SNF (31)  PCP: [facility]  No Known Allergies  Chief Complaint  Patient presents with  . Discharge Note    HPI:  Patient is a 50 y.o. female seen today at [facility] for discharge home. Pt with hx of alcohol abuse, anxiety, depression, GERD, HLD, and HTN who was hospitalized for foot pain, AMS and electrolyte disturbances. Encephalopathy has resolved.  Electrolytes corrected and follow up BMP in facility showed ongoing low potassium. Lasix was then stopped. Pt also noted to have leukocytosis but this appears to be chronic. Urine checked in hospital without significant growth.  Patient currently doing well with therapy, now stable to discharge home with home health.  Review of Systems:  Review of Systems  Constitutional: Negative for activity change, appetite change, fatigue and unexpected weight change.  HENT: Negative for congestion and hearing loss.   Eyes: Negative.   Respiratory: Negative for cough and shortness of breath.   Cardiovascular: Negative for chest pain, palpitations and leg swelling.  Gastrointestinal: Positive for diarrhea. Negative for abdominal pain and constipation.  Genitourinary: Negative for difficulty urinating and dysuria.  Musculoskeletal: Positive for arthralgias and myalgias.       To left leg  Skin: Negative for color change and wound.  Neurological: Negative for dizziness and weakness.  Psychiatric/Behavioral: Negative for agitation, behavioral problems and confusion.    Past Medical History:  Diagnosis Date  . Anxiety    takes Xanax daily  . Arthritis    shoulders  . Blood transfusion 2004  . Bruises easily    platelets are always high  . Cancer (HCC)    cervical  . Depression    takes Zoloft nightly  . Dizziness    hx of  . Emphysema   . Gastric ulcer   . GERD (gastroesophageal reflux disease)    takes Omeprazole and  Ranitidine daily  . Headache(784.0)   . History of kidney stones 2000  . Hyperlipidemia    doesn't take any meds at present time  . Hypertension    takes Metoprolol and Lisinopril occ  . Insomnia    xanax and pain pills help  . Migraines    last on e3/10/13  . Neck pain    stenosis and radiculopathy  . PONV (postoperative nausea and vomiting)   . Stroke [facility])    TIA-22 yrs ago   Past Surgical History:  Procedure Laterality Date  . ABDOMINAL HYSTERECTOMY  2004   partial d/t fibroid tumors  . ANTERIOR CERVICAL DECOMP/DISCECTOMY FUSION  11/24/2011   Procedure: ANTERIOR CERVICAL DECOMPRESSION/DISCECTOMY FUSION 3 LEVELS;  Surgeon: Hosie Spangle, MD;  Location: [place] [facility];  Service: Neurosurgery;  Laterality: N/A;  Cervical three-four, four-five,five-six anterior cervical decompression with fusion plating and bonegraft  . BREAST SURGERY  2002   breast reduction  . CERVICAL CONE BIOPSY  20+yrs ago   d/t cervical cancer  . ECTOPIC PREGNANCY SURGERY  1987/1989  . right hand  90's   cyst removed from top of hand   Social History:   reports that she has been smoking Cigarettes.  She has a 32.00 pack-year smoking history. She has never used smokeless tobacco. She reports that she does not drink alcohol or use drugs.  Family History  Problem Relation Age of Onset  . Anesthesia problems Neg Hx   . Hypotension Neg Hx   . Malignant hyperthermia Neg Hx   . Pseudochol  deficiency Neg Hx     Medications: Patient's Medications  New Prescriptions   No medications on file  Previous Medications   ACETAMINOPHEN (TYLENOL) 325 MG TABLET    Take 2 tablets (650 mg total) by mouth every 6 (six) hours as needed for mild pain (or Fever >/= 101).   ALBUTEROL (PROVENTIL HFA;VENTOLIN HFA) 108 (90 BASE) MCG/ACT INHALER    Inhale 2 puffs into the lungs every 6 (six) hours as needed. For shortness of breath   AMBULATORY NON FORMULARY MEDICATION    Give 120 ml MedPass twice daily   FOLIC ACID  (FOLVITE) 1 MG TABLET    Take 1 tablet (1 mg total) by mouth daily.   LACTOBACILLUS (ACIDOPHILUS PROBIOTIC FORMULA PO)    Take 1 capsule by mouth daily.   LACTULOSE (CHRONULAC) 10 GM/15ML SOLUTION    Take 15 mLs (10 g total) by mouth daily.   LORAZEPAM (ATIVAN) 0.5 MG TABLET    Take 0.5 mg by mouth at bedtime. Stop Date: 09/07/16   MULTIPLE VITAMIN (MULITIVITAMIN WITH MINERALS) TABS    Take 1 tablet by mouth daily.   NITROGLYCERIN (NITROSTAT) 0.4 MG SL TABLET    Place 0.4 mg under the tongue every 5 (five) minutes as needed. For chest pain   RANITIDINE (ZANTAC) 150 MG TABLET    Take 150 mg by mouth daily as needed for heartburn.    SERTRALINE (ZOLOFT) 50 MG TABLET    Take 50 mg by mouth daily. depression    SPIRONOLACTONE (ALDACTONE) 50 MG TABLET    Take 1 tablet (50 mg total) by mouth daily.  Modified Medications   No medications on file  Discontinued Medications   FEEDING SUPPLEMENT, ENSURE ENLIVE, (ENSURE ENLIVE) LIQD    Take 237 mLs by mouth 2 (two) times daily between meals.   FUROSEMIDE (LASIX) 20 MG TABLET    Take 1 tablet (20 mg total) by mouth daily.   METOPROLOL TARTRATE (LOPRESSOR) 25 MG TABLET    Take 25 mg by mouth 2 (two) times daily as needed (high blood pressure).    ONDANSETRON (ZOFRAN) 4 MG TABLET    Take 1 tablet (4 mg total) by mouth every 6 (six) hours. As needed for nausea or vomiting     Physical Exam: Vitals:   09/01/16 1056  BP: 102/74  Pulse: 69  Resp: 20  Temp: 97.6 F (36.4 C)  SpO2: 100%  Weight: 83 lb (37.6 kg)  Height: 4\' 11"  (1.499 m)    Physical Exam  Constitutional: She is oriented to person, place, and time. She appears well-developed and well-nourished. No distress.  Thin female  HENT:  Head: Normocephalic and atraumatic.  Mouth/Throat: Oropharynx is clear and moist. No oropharyngeal exudate.  Eyes: Conjunctivae are normal. Pupils are equal, round, and reactive to light.  Neck: Normal range of motion. Neck supple.  Cardiovascular: Normal  rate, regular rhythm and normal heart sounds.   Pulmonary/Chest: Effort normal and breath sounds normal.  Abdominal: Soft. Bowel sounds are normal.  Musculoskeletal: She exhibits no edema or tenderness.  Neurological: She is alert and oriented to person, place, and time.  Skin: Skin is warm and dry. She is not diaphoretic.  Psychiatric: She has a normal mood and affect.    Labs reviewed: Basic Metabolic Panel:  Recent Labs  08/06/16 2249 08/07/16 0605 08/08/16 0732 08/08/16 0900 08/08/16 1957 08/09/16 0530 08/17/16  NA  --  139 140  --  131* 136 141  K  --  3.3* 2.6*  --  4.4 4.1 3.0*  CL  --  106 109  --  103 109  --   CO2  --  24 23  --  22 22  --   GLUCOSE  --  98 84  --  98 91  --   BUN  --  <5* <5*  --  <5* <5* 7  CREATININE  --  0.40* 0.32*  --  0.38* 0.33* 0.2*  CALCIUM  --  8.1* 7.7*  --  7.8* 8.0*  --   MG 2.2 1.9  --  1.8  --  2.0  --   PHOS 1.2* 1.0* 3.4  --   --   --   --    Liver Function Tests:  Recent Labs  08/07/16 0605 08/08/16 0732 08/09/16 0530  AST 68* 57* 54*  ALT 36 32 32  ALKPHOS 274* 230* 228*  BILITOT 1.1 0.9 1.0  PROT 5.6* 5.1* 5.6*  ALBUMIN 2.1* 1.8* 2.0*    Recent Labs  07/31/16 1159  LIPASE 14    Recent Labs  08/07/16 1103 08/09/16 0530  AMMONIA 63* 39*   CBC:  Recent Labs  07/31/16 1133  08/06/16 2249 08/07/16 0605 08/08/16 0732 08/09/16 0530 08/17/16  WBC 13.2*  < > 16.2* 15.5* 16.3* 18.0*  --   NEUTROABS 10.0*  --  11.5*  --   --   --  14  HGB 9.7*  < > 9.3* 9.8* 8.7* 9.6* 9.5*  HCT 28.5*  < > 27.4* 29.3* 26.2* 29.6* 30*  MCV 97.3  < > 97.2 97.7 98.1 98.3  --   PLT 286  < > 339 405* 376 354 342  < > = values in this interval not displayed. TSH:  Recent Labs  07/31/16 1958 08/07/16 0605  TSH 2.186 4.729*   A1C: No results found for: HGBA1C Lipid Panel: No results for input(s): CHOL, HDL, LDLCALC, TRIG, CHOLHDL, LDLDIRECT in the last 8760 hours.  Radiological Exams: Dg Chest 2 View  Result Date:  08/06/2016 CLINICAL DATA:  Acute onset of leukocytosis.  Initial encounter. EXAM: CHEST  2 VIEW COMPARISON:  Chest radiograph performed 07/31/2016 FINDINGS: The lungs are well-aerated. Minimal right midlung and left basilar opacities likely reflect atelectasis. There is no evidence of pleural effusion or pneumothorax. The heart is normal in size; the mediastinal contour is within normal limits. No acute osseous abnormalities are seen. Cervical fusion hardware is partially imaged. IMPRESSION: Minimal right midlung and left basilar opacities likely reflect atelectasis. Electronically Signed   By: Garald Balding M.D.   On: 08/06/2016 22:36   Ct Head Wo Contrast  Result Date: 08/07/2016 CLINICAL DATA:  Headache EXAM: CT HEAD WITHOUT CONTRAST TECHNIQUE: Contiguous axial images were obtained from the base of the skull through the vertex without intravenous contrast. COMPARISON:  None. FINDINGS: Brain: No intracranial hemorrhage, mass effect or midline shift. Mild cerebral atrophy. Mild periventricular white matter decreased attenuation probable due to chronic small vessel ischemic changes. No acute cortical infarction. No mass lesion is noted on this unenhanced scan. Vascular: Mild atherosclerotic calcifications of carotid siphon. Skull: No skull fracture is noted. Sinuses/Orbits: No acute findings Other: None IMPRESSION: No acute intracranial abnormality. Mild cerebral atrophy. Mild periventricular white matter decreased attenuation probable due to chronic small vessel ischemic changes. Electronically Signed   By: Lahoma Crocker M.D.   On: 08/07/2016 20:05   US Abdomen Complete  Result Date: 08/07/2016 CLINICAL DATA:  Abdominal pain for 1 week, increased LFTs EXAM: ABDOMEN ULTRASOUND COMPLETE COMPARISON:  CT scan 09/13/2008 FINDINGS: Gallbladder: Gallstones and sludge noted within gallbladder. No sonographic Murphy's sign. There is mild thickening of gallbladder wall up to 3.6 mm. Common bile duct: Diameter: 8.7  mm in diameter prominent in size Liver: There is diffuse increased echogenicity of the liver with nodular contour. Chronic liver disease or early cirrhosis cannot be excluded. Clinical correlation is necessary. No focal hepatic mass. IVC: No abnormality visualized. Pancreas: Not seen due to bowel gas Spleen: Size and appearance within normal limits. Measures 4.7 cm in length Right Kidney: Length: 11.4 cm. Echogenicity within normal limits. No mass or hydronephrosis visualized. Left Kidney: Length: 11.2 cm. Echogenicity within normal limits. No mass or hydronephrosis visualized. Abdominal aorta: No aneurysm visualized. Measures up to 2.2 cm in diameter. Other findings: Perihepatic ascites is noted. IMPRESSION: 1. Small gallstones and layering sludge noted within gallbladder. There is mild thickening of gallbladder wall up to 3.6 mm without sonographic Murphy's sign. 2. There is diffuse increased echogenicity of the liver with nodular contour suspicious for chronic liver disease or cirrhosis. Perihepatic ascites is noted. No focal hepatic mass. 3. No hydronephrosis. 4. No aortic aneurysm. Electronically Signed   By: Lahoma Crocker M.D.   On: 08/07/2016 18:38    Assessment/Plan 1. Subacute confusional state Has improved  2. Adult failure to thrive Weight stable, encouraged proper nutrition and to cont supplements   3. Alcohol abuse -early cirrhosis noted, has currently quit drinking.  - lactulose (CHRONULAC) 10 GM/15ML solution; Take 15 mLs (10 g total) by mouth daily.  Dispense: 473 mL; Refill: 0 - spironolactone (ALDACTONE) 50 MG tablet; Take 1 tablet (50 mg total) by mouth daily.  Dispense: 30 tablet; Refill: 0  4. Diarrhea, unspecified type Severe diarrhea noted during hospitalization, has currently improved  5. Anxiety and depression Stable at this time. conts on ativan PO qhs PRN anxiety and zoloft 50 mg daily  - sertraline (ZOLOFT) 50 MG tablet; Take 1 tablet (50 mg total) by mouth daily.  depression  Dispense: 30 tablet; Refill: 0  6. Ankle pain.  Chronic and stable. Will need walker, BSC and WC upon discharge to help with ADLs  7. Hypokalemia Potassium of 3.0, lasix stopped. Remains on aldactone, will need follow up BMP by PCP  pt is stable for discharge-will need PT/OT per home health. DME needed. Rx faxed to pharmacy and controlled substances written with limited supply.  will need to follow up with PCP within 2 weeks, will need follow up on potassium    Nadyne Gariepy K. Harle Battiest  [facility] [phone] 8 am - 5 pm) [phone] (after hours)

## 2016-11-01 ENCOUNTER — Other Ambulatory Visit: Payer: Self-pay | Admitting: Ophthalmology

## 2016-11-01 DIAGNOSIS — H469 Unspecified optic neuritis: Secondary | ICD-10-CM

## 2016-11-02 ENCOUNTER — Encounter: Payer: Self-pay | Admitting: Orthopaedic Surgery

## 2016-11-02 ENCOUNTER — Ambulatory Visit (INDEPENDENT_AMBULATORY_CARE_PROVIDER_SITE_OTHER): Payer: Medicare Other | Admitting: Orthopaedic Surgery

## 2016-11-02 VITALS — BP 110/71 | HR 114 | Temp 97.7°F | Ht 61.0 in | Wt 104.0 lb

## 2016-11-02 DIAGNOSIS — R2681 Unsteadiness on feet: Secondary | ICD-10-CM

## 2016-11-02 DIAGNOSIS — F17210 Nicotine dependence, cigarettes, uncomplicated: Secondary | ICD-10-CM | POA: Diagnosis not present

## 2016-11-02 DIAGNOSIS — M25551 Pain in right hip: Secondary | ICD-10-CM | POA: Diagnosis not present

## 2016-11-02 DIAGNOSIS — M25561 Pain in right knee: Secondary | ICD-10-CM

## 2016-11-02 DIAGNOSIS — M25552 Pain in left hip: Secondary | ICD-10-CM | POA: Diagnosis not present

## 2016-11-02 DIAGNOSIS — M25562 Pain in left knee: Secondary | ICD-10-CM

## 2016-11-02 DIAGNOSIS — G8929 Other chronic pain: Secondary | ICD-10-CM

## 2016-11-02 MED ORDER — NAPROXEN 500 MG PO TABS: 500.0000 mg | ORAL_TABLET | Freq: Two times a day (BID) | ORAL | 5 refills | Status: DC

## 2016-11-02 NOTE — Progress Notes (Signed)
Subjective:    Patient ID: Courtney Thornton, female    DOB: 22-Jan-1966, 51 y.o.   MRN: [id number]  HPI She has long history of multiple joint pains, more of the left hip.  She had x-rays of the left hip done 08-05-16 which were negative.  She has pain of the hands and shoulders, more in the mornings. She has no trauma.  She has history of ETOH problems.  She is a smoker and does not want to stop.  She has no paresthesias. She has problems walking and is unsteady.   Review of Systems  HENT: Negative for congestion.   Respiratory: Positive for shortness of breath. Negative for cough.   Cardiovascular: Negative for chest pain and leg swelling.  Endocrine: Positive for cold intolerance.  Musculoskeletal: Positive for neck pain. Negative for arthralgias and gait problem.  Allergic/Immunologic: Positive for environmental allergies.  Neurological: Positive for headaches.  Psychiatric/Behavioral: The patient is nervous/anxious.    Past Medical History:  Diagnosis Date  . Anxiety    takes Xanax daily  . Arthritis    shoulders  . Blood transfusion 2004  . Bruises easily    platelets are always high  . Cancer (HCC)    cervical  . Depression    takes Zoloft nightly  . Dizziness    hx of  . Emphysema   . Gastric ulcer   . GERD (gastroesophageal reflux disease)    takes Omeprazole and Ranitidine daily  . Headache(784.0)   . Heart disease   . History of kidney stones 2000  . Hyperlipidemia    doesn't take any meds at present time  . Hypertension    takes Metoprolol and Lisinopril occ  . Insomnia    xanax and pain pills help  . Liver disease   . Migraines    last on e3/10/13  . Multiple sclerosis ([place])   . Neck pain    stenosis and radiculopathy  . PONV (postoperative nausea and vomiting)   . Stroke [facility])    TIA-22 yrs ago    Past Surgical History:  Procedure Laterality Date  . ABDOMINAL HYSTERECTOMY  2004   partial d/t fibroid tumors  . ANTERIOR CERVICAL  DECOMP/DISCECTOMY FUSION  11/24/2011   Procedure: ANTERIOR CERVICAL DECOMPRESSION/DISCECTOMY FUSION 3 LEVELS;  Surgeon: Hosie Spangle, MD;  Location: [place] [facility];  Service: Neurosurgery;  Laterality: N/A;  Cervical three-four, four-five,five-six anterior cervical decompression with fusion plating and bonegraft  . BREAST SURGERY  2002   breast reduction  . CERVICAL CONE BIOPSY  20+yrs ago   d/t cervical cancer  . ECTOPIC PREGNANCY SURGERY  1987/1989  . right hand  90's   cyst removed from top of hand    Current Outpatient Prescriptions on File Prior to Visit  Medication Sig Dispense Refill  . acetaminophen (TYLENOL) 325 MG tablet Take 2 tablets (650 mg total) by mouth every 6 (six) hours as needed for mild pain (or Fever >/= 101). 30 tablet 1  . albuterol (PROVENTIL HFA;VENTOLIN HFA) 108 (90 Base) MCG/ACT inhaler Inhale 2 puffs into the lungs every 6 (six) hours as needed. For shortness of breath 8 g 0  . AMBULATORY NON FORMULARY MEDICATION Give 120 ml MedPass twice daily    . folic acid (FOLVITE) 1 MG tablet Take 1 tablet (1 mg total) by mouth daily. 30 tablet 0  . Lactobacillus (ACIDOPHILUS PROBIOTIC FORMULA PO) Take 1 capsule by mouth daily.    Marland Kitchen lactulose (CHRONULAC) 10 GM/15ML solution Take 15 mLs (  10 g total) by mouth daily. 473 mL 0  . LORazepam (ATIVAN) 0.5 MG tablet Take 0.5 mg by mouth at bedtime. Stop Date: 09/07/16    . Multiple Vitamin (MULITIVITAMIN WITH MINERALS) TABS Take 1 tablet by mouth daily.    . nitroGLYCERIN (NITROSTAT) 0.4 MG SL tablet Place 1 tablet (0.4 mg total) under the tongue every 5 (five) minutes as needed. For chest pain 30 tablet 0  . ranitidine (ZANTAC) 150 MG tablet Take 150 mg by mouth daily as needed for heartburn.     . sertraline (ZOLOFT) 50 MG tablet Take 1 tablet (50 mg total) by mouth daily. depression 30 tablet 0  . spironolactone (ALDACTONE) 50 MG tablet Take 1 tablet (50 mg total) by mouth daily. 30 tablet 0   No current facility-administered  medications on file prior to visit.     Social History   Social History  . Marital status: Married    Spouse name: N/A  . Number of children: N/A  . Years of education: N/A   Occupational History  . Not on file.   Social History Main Topics  . Smoking status: Current Every Day Smoker    Packs/day: 1.00    Years: 32.00    Types: Cigarettes  . Smokeless tobacco: Never Used  . Alcohol use No     Comment: 1.5 40oz a day  . Drug use: No  . Sexual activity: Yes    Birth control/ protection: Surgical   Other Topics Concern  . Not on file   Social History Narrative  . No narrative on file    Family History  Problem Relation Age of Onset  . Arthritis Father   . Cancer Father   . Anesthesia problems Neg Hx   . Hypotension Neg Hx   . Malignant hyperthermia Neg Hx   . Pseudochol deficiency Neg Hx     BP 110/71   Pulse (!) 114   Temp 97.7 F (36.5 C)   Ht 5\' 1"  (1.549 m)   Wt 104 lb (47.2 kg)   BMI 19.65 kg/m      Objective:   Physical Exam  Constitutional: She is oriented to person, place, and time. She appears well-developed and well-nourished.  HENT:  Head: Normocephalic and atraumatic.  Eyes: Conjunctivae and EOM are normal. Pupils are equal, round, and reactive to light.  Neck: Normal range of motion. Neck supple.  Cardiovascular: Normal rate, regular rhythm and intact distal pulses.   Pulmonary/Chest: Effort normal.  Abdominal: Soft.  Musculoskeletal: She exhibits tenderness (Left hip slightly tender but has full motion.  Gait is unsteady.  ROM of hips is normal.  ROM of shoulders normal.  Neck negative.).  Neurological: She is alert and oriented to person, place, and time. She displays normal reflexes. No cranial nerve deficit. She exhibits normal muscle tone. Coordination normal.  Skin: Skin is warm and dry.  Psychiatric: She has a normal mood and affect. Her behavior is normal. Judgment and thought content normal.  Vitals reviewed.          Assessment & Plan:   Encounter Diagnoses  Name Primary?  . Pain of left hip joint Yes  . Chronic arthralgias of knees and hips   . Unsteady gait   . Cigarette nicotine dependence without complication    I will begin Naprosyn.  Precautions discussed.  I will begin PT for the gait.  Return in one month.  Call if any problem.  Electronically Signed Sanjuana Kava, MD 2/20/201810:25 AM

## 2016-11-12 ENCOUNTER — Ambulatory Visit
Admission: RE | Admit: 2016-11-12 | Discharge: 2016-11-12 | Disposition: A | Discharge status: Home / Self Care | Payer: Medicare Other | Source: Ambulatory Visit | Attending: Ophthalmology | Admitting: Ophthalmology

## 2016-11-12 DIAGNOSIS — H469 Unspecified optic neuritis: Secondary | ICD-10-CM

## 2016-11-15 ENCOUNTER — Other Ambulatory Visit: Payer: Self-pay | Admitting: Ophthalmology

## 2016-11-15 DIAGNOSIS — H469 Unspecified optic neuritis: Secondary | ICD-10-CM

## 2016-11-17 ENCOUNTER — Ambulatory Visit (HOSPITAL_COMMUNITY): Payer: Medicare Other | Attending: Orthopaedic Surgery

## 2016-11-17 ENCOUNTER — Encounter (HOSPITAL_COMMUNITY): Payer: Self-pay

## 2016-11-17 DIAGNOSIS — M25552 Pain in left hip: Secondary | ICD-10-CM | POA: Insufficient documentation

## 2016-11-17 DIAGNOSIS — R262 Difficulty in walking, not elsewhere classified: Secondary | ICD-10-CM | POA: Diagnosis present

## 2016-11-17 DIAGNOSIS — R2681 Unsteadiness on feet: Secondary | ICD-10-CM

## 2016-11-17 DIAGNOSIS — M5442 Lumbago with sciatica, left side: Secondary | ICD-10-CM | POA: Insufficient documentation

## 2016-11-17 DIAGNOSIS — M5441 Lumbago with sciatica, right side: Secondary | ICD-10-CM | POA: Diagnosis present

## 2016-11-17 DIAGNOSIS — M6281 Muscle weakness (generalized): Secondary | ICD-10-CM | POA: Diagnosis present

## 2016-11-17 DIAGNOSIS — G8929 Other chronic pain: Secondary | ICD-10-CM | POA: Diagnosis present
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
## 2016-11-23 ENCOUNTER — Telehealth (HOSPITAL_COMMUNITY): Payer: Self-pay

## 2016-11-23 ENCOUNTER — Ambulatory Visit (HOSPITAL_COMMUNITY): Payer: Medicare Other
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
## 2016-11-26 ENCOUNTER — Ambulatory Visit
Admission: RE | Admit: 2016-11-26 | Discharge: 2016-11-26 | Disposition: A | Discharge status: Home / Self Care | Payer: Medicare Other | Source: Ambulatory Visit | Attending: Ophthalmology | Admitting: Ophthalmology

## 2016-11-26 DIAGNOSIS — H469 Unspecified optic neuritis: Secondary | ICD-10-CM

## 2016-11-26 MED ORDER — GADOBENATE DIMEGLUMINE 529 MG/ML IV SOLN
10.0000 mL | Freq: Once | INTRAVENOUS | Status: AC | PRN
  Administered 2016-11-26: 10 mL via INTRAVENOUS

## 2016-11-29 ENCOUNTER — Ambulatory Visit (HOSPITAL_COMMUNITY): Payer: Medicare Other | Admitting: Physical Therapy

## 2016-11-29 ENCOUNTER — Telehealth (HOSPITAL_COMMUNITY): Payer: Self-pay | Admitting: Physical Therapy

## 2016-11-29 NOTE — Telephone Encounter (Signed)
No show #2. Spoke with pt concerning her missed appointment and she states she forgot about it. Reminded her of her next appointment and policy to schedule only 1 appointment out at a time. She verbalized understanding.   2:30 PM,11/29/16 Elly Modena PT, [facility] [phone]

## 2016-11-30 ENCOUNTER — Ambulatory Visit: Payer: Medicare Other | Admitting: Orthopaedic Surgery

## 2016-11-30 ENCOUNTER — Ambulatory Visit (HOSPITAL_COMMUNITY): Payer: Medicare Other

## 2016-12-01 ENCOUNTER — Ambulatory Visit (HOSPITAL_COMMUNITY): Payer: Medicare Other

## 2016-12-01 ENCOUNTER — Other Ambulatory Visit: Payer: Self-pay | Admitting: Radiology

## 2016-12-01 DIAGNOSIS — R2681 Unsteadiness on feet: Secondary | ICD-10-CM

## 2016-12-01 DIAGNOSIS — M25552 Pain in left hip: Secondary | ICD-10-CM

## 2016-12-01 DIAGNOSIS — M25551 Pain in right hip: Secondary | ICD-10-CM

## 2016-12-02 ENCOUNTER — Encounter: Payer: Self-pay | Admitting: Orthopaedic Surgery

## 2016-12-02 ENCOUNTER — Ambulatory Visit (INDEPENDENT_AMBULATORY_CARE_PROVIDER_SITE_OTHER): Payer: Medicare Other | Admitting: Orthopaedic Surgery

## 2016-12-02 VITALS — BP 93/57 | HR 98 | Temp 97.9°F | Ht 61.0 in | Wt 106.0 lb

## 2016-12-02 DIAGNOSIS — M25562 Pain in left knee: Secondary | ICD-10-CM | POA: Diagnosis not present

## 2016-12-02 DIAGNOSIS — M25561 Pain in right knee: Secondary | ICD-10-CM

## 2016-12-02 DIAGNOSIS — G8929 Other chronic pain: Secondary | ICD-10-CM

## 2016-12-02 DIAGNOSIS — M25551 Pain in right hip: Secondary | ICD-10-CM | POA: Diagnosis not present

## 2016-12-02 DIAGNOSIS — M25552 Pain in left hip: Secondary | ICD-10-CM | POA: Diagnosis not present

## 2016-12-02 DIAGNOSIS — F17210 Nicotine dependence, cigarettes, uncomplicated: Secondary | ICD-10-CM

## 2016-12-02 DIAGNOSIS — R2681 Unsteadiness on feet: Secondary | ICD-10-CM | POA: Diagnosis not present

## 2016-12-02 NOTE — Patient Instructions (Signed)
Steps to Quit Smoking Smoking tobacco can be bad for your health. It can also affect almost every organ in your body. Smoking puts you and people around you at risk for many serious long-lasting (chronic) diseases. Quitting smoking is hard, but it is one of the best things that you can do for your health. It is never too late to quit. What are the benefits of quitting smoking? When you quit smoking, you lower your risk for getting serious diseases and conditions. They can include:  Lung cancer or lung disease.  Heart disease.  Stroke.  Heart attack.  Not being able to have children (infertility).  Weak bones (osteoporosis) and broken bones (fractures). If you have coughing, wheezing, and shortness of breath, those symptoms may get better when you quit. You may also get sick less often. If you are pregnant, quitting smoking can help to lower your chances of having a baby of low birth weight. What can I do to help me quit smoking? Talk with your doctor about what can help you quit smoking. Some things you can do (strategies) include:  Quitting smoking totally, instead of slowly cutting back how much you smoke over a period of time.  Going to in-person counseling. You are more likely to quit if you go to many counseling sessions.  Using resources and support systems, such as:  Online chats with a counselor.  Phone quitlines.  Printed self-help materials.  Support groups or group counseling.  Text messaging programs.  Mobile phone apps or applications.  Taking medicines. Some of these medicines may have nicotine in them. If you are pregnant or breastfeeding, do not take any medicines to quit smoking unless your doctor says it is okay. Talk with your doctor about counseling or other things that can help you. Talk with your doctor about using more than one strategy at the same time, such as taking medicines while you are also going to in-person counseling. This can help make quitting  easier. What things can I do to make it easier to quit? Quitting smoking might feel very hard at first, but there is a lot that you can do to make it easier. Take these steps:  Talk to your family and friends. Ask them to support and encourage you.  Call phone quitlines, reach out to support groups, or work with a counselor.  Ask people who smoke to not smoke around you.  Avoid places that make you want (trigger) to smoke, such as:  Bars.  Parties.  Smoke-break areas at work.  Spend time with people who do not smoke.  Lower the stress in your life. Stress can make you want to smoke. Try these things to help your stress:  Getting regular exercise.  Deep-breathing exercises.  Yoga.  Meditating.  Doing a body scan. To do this, close your eyes, focus on one area of your body at a time from head to toe, and notice which parts of your body are tense. Try to relax the muscles in those areas.  Download or buy apps on your mobile phone or tablet that can help you stick to your quit plan. There are many free apps, such as QuitGuide from the CDC (Centers for Disease Control and Prevention). You can find more support from smokefree.gov and other websites. This information is not intended to replace advice given to you by your health care provider. Make sure you discuss any questions you have with your health care provider. Document Released: 06/26/2009 Document Revised: 04/27/2016 Document   Reviewed: 01/14/2015 Elsevier Interactive Patient Education  2017 Elsevier Inc.  

## 2016-12-02 NOTE — Progress Notes (Signed)
Patient WU:[id number] Courtney Thornton, female DOB:1966-05-14, 51 y.o. YNW:[id number]  Chief Complaint  Patient presents with  . Follow-up    left hip pain    HPI  Courtney Thornton is a 51 y.o. female who has pain of the hip on the left.  She uses a cane.  She is taking her Naprosyn and it helped.  She has been to PT.  PT suggested she also have OT and this has been ordered for her.  She is walking better.  She has no paresthesias or new trauma.  She smokes and is not willing to stop at all.  HPI  Body mass index is 20.03 kg/m.  ROS  Review of Systems  HENT: Negative for congestion.   Respiratory: Positive for shortness of breath. Negative for cough.   Cardiovascular: Negative for chest pain and leg swelling.  Endocrine: Positive for cold intolerance.  Musculoskeletal: Positive for neck pain. Negative for arthralgias and gait problem.  Allergic/Immunologic: Positive for environmental allergies.  Neurological: Positive for headaches.  Psychiatric/Behavioral: The patient is nervous/anxious.     Past Medical History:  Diagnosis Date  . Anxiety    takes Xanax daily  . Arthritis    shoulders  . Blood transfusion 2004  . Bruises easily    platelets are always high  . Cancer (HCC)    cervical  . Depression    takes Zoloft nightly  . Dizziness    hx of  . Emphysema   . Gastric ulcer   . GERD (gastroesophageal reflux disease)    takes Omeprazole and Ranitidine daily  . Headache(784.0)   . Heart disease   . History of kidney stones 2000  . Hyperlipidemia    doesn't take any meds at present time  . Hypertension    takes Metoprolol and Lisinopril occ  . Insomnia    xanax and pain pills help  . Liver disease   . Migraines    last on e3/10/13  . Multiple sclerosis ([place])   . Neck pain    stenosis and radiculopathy  . PONV (postoperative nausea and vomiting)   . Stroke [facility])    TIA-22 yrs ago    Past Surgical History:  Procedure Laterality Date  . ABDOMINAL  HYSTERECTOMY  2004   partial d/t fibroid tumors  . ANTERIOR CERVICAL DECOMP/DISCECTOMY FUSION  11/24/2011   Procedure: ANTERIOR CERVICAL DECOMPRESSION/DISCECTOMY FUSION 3 LEVELS;  Surgeon: Courtney Spangle, MD;  Location: [facility]UR[facility];  Service: Neurosurgery;  Laterality: N/A;  Cervical three-four, four-five,five-six anterior cervical decompression with fusion plating and bonegraft  . BREAST SURGERY  2002   breast reduction  . CERVICAL CONE BIOPSY  20+yrs ago   d/t cervical cancer  . ECTOPIC PREGNANCY SURGERY  1987/1989  . right hand  90's   cyst removed from top of hand    Family History  Problem Relation Age of Onset  . Arthritis Father   . Cancer Father   . Anesthesia problems Neg Hx   . Hypotension Neg Hx   . Malignant hyperthermia Neg Hx   . Pseudochol deficiency Neg Hx     Social History Social History  Substance Use Topics  . Smoking status: Current Every Day Smoker    Packs/day: 1.00    Years: 32.00    Types: Cigarettes  . Smokeless tobacco: Never Used  . Alcohol use No     Comment: 1.5 40oz a day    No Known Allergies  Current Outpatient Prescriptions  Medication Sig Dispense Refill  .  acetaminophen (TYLENOL) 325 MG tablet Take 2 tablets (650 mg total) by mouth every 6 (six) hours as needed for mild pain (or Fever >/= 101). 30 tablet 1  . albuterol (PROVENTIL HFA;VENTOLIN HFA) 108 (90 Base) MCG/ACT inhaler Inhale 2 puffs into the lungs every 6 (six) hours as needed. For shortness of breath 8 g 0  . AMBULATORY NON FORMULARY MEDICATION Give 120 ml MedPass twice daily    . folic acid (FOLVITE) 1 MG tablet Take 1 tablet (1 mg total) by mouth daily. 30 tablet 0  . Lactobacillus (ACIDOPHILUS PROBIOTIC FORMULA PO) Take 1 capsule by mouth daily.    Marland Kitchen lactulose (CHRONULAC) 10 GM/15ML solution Take 15 mLs (10 g total) by mouth daily. 473 mL 0  . LORazepam (ATIVAN) 0.5 MG tablet Take 0.5 mg by mouth at bedtime. Stop Date: 09/07/16    . Multiple Vitamin (MULITIVITAMIN WITH  MINERALS) TABS Take 1 tablet by mouth daily.    . naproxen (NAPROSYN) 500 MG tablet Take 1 tablet (500 mg total) by mouth 2 (two) times daily with a meal. 60 tablet 5  . nitroGLYCERIN (NITROSTAT) 0.4 MG SL tablet Place 1 tablet (0.4 mg total) under the tongue every 5 (five) minutes as needed. For chest pain 30 tablet 0  . ranitidine (ZANTAC) 150 MG tablet Take 150 mg by mouth daily as needed for heartburn.     . sertraline (ZOLOFT) 50 MG tablet Take 1 tablet (50 mg total) by mouth daily. depression 30 tablet 0  . spironolactone (ALDACTONE) 50 MG tablet Take 1 tablet (50 mg total) by mouth daily. 30 tablet 0   No current facility-administered medications for this visit.      Physical Exam  Blood pressure (!) 93/57, pulse 98, temperature 97.9 F (36.6 C), height 5\' 1"  (1.549 m), weight 106 lb (48.1 kg).  Constitutional: overall normal hygiene, normal nutrition, well developed, normal grooming, normal body habitus. Assistive device:cane  Musculoskeletal: gait and station Limp left, muscle tone and strength are normal, no tremors or atrophy is present.  .  Neurological: coordination overall normal.  Deep tendon reflex/nerve stretch intact.  Sensation normal.  Cranial nerves II-XII intact.   Skin:   Normal overall no scars, lesions, ulcers or rashes. No psoriasis.  Psychiatric: Alert and oriented x 3.  Recent memory intact, remote memory unclear.  Normal mood and affect. Well groomed.  Good eye contact.  Cardiovascular: overall no swelling, no varicosities, no edema bilaterally, normal temperatures of the legs and arms, no clubbing, cyanosis and good capillary refill.  Lymphatic: palpation is normal.  Her left hip has decreased motion and pain but not as painful as last week.  She has a limp to the left.  NV intact.  The patient has been educated about the nature of the problem(s) and counseled on treatment options.  The patient appeared to understand what I have discussed and is in  agreement with it.  Encounter Diagnoses  Name Primary?  . Pain of left hip joint Yes  . Chronic arthralgias of knees and hips   . Unsteady gait   . Cigarette nicotine dependence without complication     PLAN Call if any problems.  Precautions discussed.  Continue current medications.   Return to clinic 3 weeks   Continue PT and OT.  Electronically Signed Sanjuana Kava, MD 3/22/20183:31 PM

## 2016-12-06 ENCOUNTER — Encounter (HOSPITAL_COMMUNITY): Payer: Medicare Other

## 2016-12-08 ENCOUNTER — Encounter (HOSPITAL_COMMUNITY): Payer: Medicare Other

## 2016-12-13 ENCOUNTER — Encounter (HOSPITAL_COMMUNITY): Payer: Medicare Other

## 2016-12-15 ENCOUNTER — Encounter (HOSPITAL_COMMUNITY): Payer: Medicare Other

## 2016-12-20 ENCOUNTER — Encounter (HOSPITAL_COMMUNITY): Payer: Medicare Other

## 2016-12-23 ENCOUNTER — Ambulatory Visit (INDEPENDENT_AMBULATORY_CARE_PROVIDER_SITE_OTHER): Payer: Medicare HMO | Admitting: Orthopaedic Surgery

## 2016-12-23 ENCOUNTER — Encounter: Payer: Self-pay | Admitting: Orthopaedic Surgery

## 2016-12-23 VITALS — BP 130/80 | HR 99 | Temp 97.5°F | Ht 61.0 in | Wt 108.0 lb

## 2016-12-23 DIAGNOSIS — R2681 Unsteadiness on feet: Secondary | ICD-10-CM

## 2016-12-23 DIAGNOSIS — M25552 Pain in left hip: Secondary | ICD-10-CM | POA: Diagnosis not present

## 2016-12-23 DIAGNOSIS — G8929 Other chronic pain: Secondary | ICD-10-CM

## 2016-12-23 DIAGNOSIS — M25561 Pain in right knee: Secondary | ICD-10-CM

## 2016-12-23 DIAGNOSIS — F17210 Nicotine dependence, cigarettes, uncomplicated: Secondary | ICD-10-CM | POA: Diagnosis not present

## 2016-12-23 DIAGNOSIS — M25551 Pain in right hip: Secondary | ICD-10-CM

## 2016-12-23 DIAGNOSIS — M25562 Pain in left knee: Secondary | ICD-10-CM

## 2016-12-23 NOTE — Patient Instructions (Signed)
Steps to Quit Smoking Smoking tobacco can be bad for your health. It can also affect almost every organ in your body. Smoking puts you and people around you at risk for many serious long-lasting (chronic) diseases. Quitting smoking is hard, but it is one of the best things that you can do for your health. It is never too late to quit. What are the benefits of quitting smoking? When you quit smoking, you lower your risk for getting serious diseases and conditions. They can include:  Lung cancer or lung disease.  Heart disease.  Stroke.  Heart attack.  Not being able to have children (infertility).  Weak bones (osteoporosis) and broken bones (fractures). If you have coughing, wheezing, and shortness of breath, those symptoms may get better when you quit. You may also get sick less often. If you are pregnant, quitting smoking can help to lower your chances of having a baby of low birth weight. What can I do to help me quit smoking? Talk with your doctor about what can help you quit smoking. Some things you can do (strategies) include:  Quitting smoking totally, instead of slowly cutting back how much you smoke over a period of time.  Going to in-person counseling. You are more likely to quit if you go to many counseling sessions.  Using resources and support systems, such as:  Online chats with a counselor.  Phone quitlines.  Printed self-help materials.  Support groups or group counseling.  Text messaging programs.  Mobile phone apps or applications.  Taking medicines. Some of these medicines may have nicotine in them. If you are pregnant or breastfeeding, do not take any medicines to quit smoking unless your doctor says it is okay. Talk with your doctor about counseling or other things that can help you. Talk with your doctor about using more than one strategy at the same time, such as taking medicines while you are also going to in-person counseling. This can help make quitting  easier. What things can I do to make it easier to quit? Quitting smoking might feel very hard at first, but there is a lot that you can do to make it easier. Take these steps:  Talk to your family and friends. Ask them to support and encourage you.  Call phone quitlines, reach out to support groups, or work with a counselor.  Ask people who smoke to not smoke around you.  Avoid places that make you want (trigger) to smoke, such as:  Bars.  Parties.  Smoke-break areas at work.  Spend time with people who do not smoke.  Lower the stress in your life. Stress can make you want to smoke. Try these things to help your stress:  Getting regular exercise.  Deep-breathing exercises.  Yoga.  Meditating.  Doing a body scan. To do this, close your eyes, focus on one area of your body at a time from head to toe, and notice which parts of your body are tense. Try to relax the muscles in those areas.  Download or buy apps on your mobile phone or tablet that can help you stick to your quit plan. There are many free apps, such as QuitGuide from the CDC (Centers for Disease Control and Prevention). You can find more support from smokefree.gov and other websites. This information is not intended to replace advice given to you by your health care provider. Make sure you discuss any questions you have with your health care provider. Document Released: 06/26/2009 Document Revised: 04/27/2016 Document   Reviewed: 01/14/2015 Elsevier Interactive Patient Education  2017 Elsevier Inc.  

## 2016-12-23 NOTE — Addendum Note (Signed)
Addended by: Glory Buff on: 12/23/2016 10:41 AM   Modules accepted: Orders

## 2016-12-23 NOTE — Progress Notes (Signed)
Patient Courtney Thornton, female DOB:December 24, 1965, 51 y.o. WIO:[id number]  Chief Complaint  Patient presents with  . Follow-up    left hip pain    HPI  Courtney Thornton is a 51 y.o. female who has unsteady gait, weakness of upper and lower extremities.  She went to PT once. She was to go to OT and PT but has not.  She says she forgot.  A family member is with her today and I stressed importance of her going to PT.  They will assist in doing this.  She has no new trauma.  She is using her cane but has poor gait.   HPI  Body mass index is 20.41 kg/m.  ROS  Review of Systems  HENT: Negative for congestion.   Respiratory: Positive for shortness of breath. Negative for cough.   Cardiovascular: Negative for chest pain and leg swelling.  Endocrine: Positive for cold intolerance.  Musculoskeletal: Positive for neck pain. Negative for arthralgias and gait problem.  Allergic/Immunologic: Positive for environmental allergies.  Neurological: Positive for headaches.  Psychiatric/Behavioral: The patient is nervous/anxious.     Past Medical History:  Diagnosis Date  . Anxiety    takes Xanax daily  . Arthritis    shoulders  . Blood transfusion 2004  . Bruises easily    platelets are always high  . Cancer (HCC)    cervical  . Depression    takes Zoloft nightly  . Dizziness    hx of  . Emphysema   . Gastric ulcer   . GERD (gastroesophageal reflux disease)    takes Omeprazole and Ranitidine daily  . Headache(784.0)   . Heart disease   . History of kidney stones 2000  . Hyperlipidemia    doesn't take any meds at present time  . Hypertension    takes Metoprolol and Lisinopril occ  . Insomnia    xanax and pain pills help  . Liver disease   . Migraines    last on e3/10/13  . Multiple sclerosis ([place])   . Neck pain    stenosis and radiculopathy  . PONV (postoperative nausea and vomiting)   . Stroke [facility])    TIA-22 yrs ago    Past Surgical History:  Procedure  Laterality Date  . ABDOMINAL HYSTERECTOMY  2004   partial d/t fibroid tumors  . ANTERIOR CERVICAL DECOMP/DISCECTOMY FUSION  11/24/2011   Procedure: ANTERIOR CERVICAL DECOMPRESSION/DISCECTOMY FUSION 3 LEVELS;  Surgeon: Hosie Spangle, MD;  Location: [facility];  Service: Neurosurgery;  Laterality: N/A;  Cervical three-four, four-five,five-six anterior cervical decompression with fusion plating and bonegraft  . BREAST SURGERY  2002   breast reduction  . CERVICAL CONE BIOPSY  20+yrs ago   d/t cervical cancer  . ECTOPIC PREGNANCY SURGERY  1987/1989  . right hand  90's   cyst removed from top of hand    Family History  Problem Relation Age of Onset  . Arthritis Father   . Cancer Father   . Anesthesia problems Neg Hx   . Hypotension Neg Hx   . Malignant hyperthermia Neg Hx   . Pseudochol deficiency Neg Hx     Social History Social History  Substance Use Topics  . Smoking status: Current Every Day Smoker    Packs/day: 1.00    Years: 32.00    Types: Cigarettes  . Smokeless tobacco: Never Used  . Alcohol use No     Comment: 1.5 40oz a day    No Known Allergies  Current Outpatient  Prescriptions  Medication Sig Dispense Refill  . acetaminophen (TYLENOL) 325 MG tablet Take 2 tablets (650 mg total) by mouth every 6 (six) hours as needed for mild pain (or Fever >/= 101). 30 tablet 1  . albuterol (PROVENTIL HFA;VENTOLIN HFA) 108 (90 Base) MCG/ACT inhaler Inhale 2 puffs into the lungs every 6 (six) hours as needed. For shortness of breath 8 g 0  . AMBULATORY NON FORMULARY MEDICATION Give 120 ml MedPass twice daily    . folic acid (FOLVITE) 1 MG tablet Take 1 tablet (1 mg total) by mouth daily. 30 tablet 0  . Lactobacillus (ACIDOPHILUS PROBIOTIC FORMULA PO) Take 1 capsule by mouth daily.    Marland Kitchen lactulose (CHRONULAC) 10 GM/15ML solution Take 15 mLs (10 g total) by mouth daily. 473 mL 0  . LORazepam (ATIVAN) 0.5 MG tablet Take 0.5 mg by mouth at bedtime. Stop Date: 09/07/16    .  Multiple Vitamin (MULITIVITAMIN WITH MINERALS) TABS Take 1 tablet by mouth daily.    . naproxen (NAPROSYN) 500 MG tablet Take 1 tablet (500 mg total) by mouth 2 (two) times daily with a meal. 60 tablet 5  . nitroGLYCERIN (NITROSTAT) 0.4 MG SL tablet Place 1 tablet (0.4 mg total) under the tongue every 5 (five) minutes as needed. For chest pain 30 tablet 0  . ranitidine (ZANTAC) 150 MG tablet Take 150 mg by mouth daily as needed for heartburn.     . sertraline (ZOLOFT) 50 MG tablet Take 1 tablet (50 mg total) by mouth daily. depression 30 tablet 0  . spironolactone (ALDACTONE) 50 MG tablet Take 1 tablet (50 mg total) by mouth daily. 30 tablet 0   No current facility-administered medications for this visit.      Physical Exam  Blood pressure 130/80, pulse 99, temperature 97.5 F (36.4 C), height 5\' 1"  (1.549 m), weight 108 lb (49 kg).  Constitutional: overall normal hygiene, normal nutrition, well developed, normal grooming, normal body habitus. Assistive device:cane  Musculoskeletal: gait and station Limp unsteady gait, muscle tone and strength are normal, no tremors or atrophy is present.  .  Neurological: coordination overall normal.  Deep tendon reflex/nerve stretch intact.  Sensation normal.  Cranial nerves II-XII intact.   Skin:   Normal overall no scars, lesions, ulcers or rashes. No psoriasis.  Psychiatric: Alert and oriented x 3.  Recent memory intact, remote memory unclear.  Normal mood and affect. Well groomed.  Good eye contact.  Cardiovascular: overall no swelling, no varicosities, no edema bilaterally, normal temperatures of the legs and arms, no clubbing, cyanosis and good capillary refill.  Lymphatic: palpation is normal.  She has very unsteady gait with a cane.  She is slow.  She has no pains.  Upper extremities are weak as well.  She will benefit from PT.  The patient has been educated about the nature of the problem(s) and counseled on treatment options.  The patient  appeared to understand what I have discussed and is in agreement with it.  Encounter Diagnoses  Name Primary?  . Pain of left hip joint Yes  . Chronic arthralgias of knees and hips   . Unsteady gait   . Cigarette nicotine dependence without complication     PLAN Call if any problems.  Precautions discussed.  Continue current medications.   Return to clinic 1 month   Go to PT.  Electronically Signed Sanjuana Kava, MD 4/12/201810:30 AM

## 2017-01-05 ENCOUNTER — Ambulatory Visit (HOSPITAL_COMMUNITY): Payer: Medicare HMO | Admitting: Physical Therapy

## 2017-01-05 ENCOUNTER — Ambulatory Visit (HOSPITAL_COMMUNITY): Payer: Medicare HMO

## 2017-01-13 ENCOUNTER — Ambulatory Visit (HOSPITAL_COMMUNITY): Payer: Medicare HMO | Attending: Orthopaedic Surgery

## 2017-01-13 DIAGNOSIS — R2681 Unsteadiness on feet: Secondary | ICD-10-CM | POA: Insufficient documentation

## 2017-01-13 DIAGNOSIS — R262 Difficulty in walking, not elsewhere classified: Secondary | ICD-10-CM | POA: Insufficient documentation

## 2017-01-13 DIAGNOSIS — M6281 Muscle weakness (generalized): Secondary | ICD-10-CM | POA: Insufficient documentation

## 2017-01-13 DIAGNOSIS — M25552 Pain in left hip: Secondary | ICD-10-CM | POA: Insufficient documentation

## 2017-01-13 DIAGNOSIS — M545 Low back pain: Secondary | ICD-10-CM | POA: Insufficient documentation

## 2017-01-13 DIAGNOSIS — G8929 Other chronic pain: Secondary | ICD-10-CM | POA: Insufficient documentation

## 2017-01-15 ENCOUNTER — Encounter (HOSPITAL_COMMUNITY): Payer: Self-pay | Admitting: Emergency Medicine

## 2017-01-15 ENCOUNTER — Emergency Department (HOSPITAL_COMMUNITY)
Admission: EM | Admit: 2017-01-15 | Discharge: 2017-01-15 | Disposition: A | Discharge status: Home / Self Care | Payer: Medicare HMO | Attending: Emergency Medicine | Admitting: Emergency Medicine

## 2017-01-15 ENCOUNTER — Emergency Department (HOSPITAL_COMMUNITY): Payer: Medicare HMO

## 2017-01-15 DIAGNOSIS — R079 Chest pain, unspecified: Secondary | ICD-10-CM | POA: Diagnosis present

## 2017-01-15 DIAGNOSIS — Z79899 Other long term (current) drug therapy: Secondary | ICD-10-CM | POA: Diagnosis not present

## 2017-01-15 DIAGNOSIS — M791 Myalgia, unspecified site: Secondary | ICD-10-CM

## 2017-01-15 DIAGNOSIS — I10 Essential (primary) hypertension: Secondary | ICD-10-CM | POA: Diagnosis not present

## 2017-01-15 DIAGNOSIS — R072 Precordial pain: Secondary | ICD-10-CM | POA: Diagnosis not present

## 2017-01-15 DIAGNOSIS — Z8673 Personal history of transient ischemic attack (TIA), and cerebral infarction without residual deficits: Secondary | ICD-10-CM | POA: Insufficient documentation

## 2017-01-15 DIAGNOSIS — Z8541 Personal history of malignant neoplasm of cervix uteri: Secondary | ICD-10-CM | POA: Diagnosis not present

## 2017-01-15 DIAGNOSIS — F17210 Nicotine dependence, cigarettes, uncomplicated: Secondary | ICD-10-CM | POA: Insufficient documentation

## 2017-01-15 LAB — BASIC METABOLIC PANEL
Anion gap: 8 (ref 5–15)
BUN: 10 mg/dL (ref 6–20)
CHLORIDE: 106 mmol/L (ref 101–111)
CO2: 26 mmol/L (ref 22–32)
Calcium: 9.9 mg/dL (ref 8.9–10.3)
Creatinine, Ser: 0.45 mg/dL (ref 0.44–1.00)
GFR calc Af Amer: >60 mL/min (ref >60)
GFR calc non Af Amer: >60 mL/min (ref >60)
Glucose, Bld: 118 mg/dL — ABNORMAL HIGH (ref 65–99)
Potassium: 3.6 mmol/L (ref 3.5–5.1)
SODIUM: 140 mmol/L (ref 135–145)

## 2017-01-15 LAB — HEPATIC FUNCTION PANEL
ALBUMIN: 4 g/dL (ref 3.5–5.0)
ALT: 16 U/L (ref 14–54)
AST: 21 U/L (ref 15–41)
Alkaline Phosphatase: 66 U/L (ref 38–126)
Bilirubin, Direct: <0.1 mg/dL — ABNORMAL LOW (ref 0.1–0.5)
TOTAL PROTEIN: 7.7 g/dL (ref 6.5–8.1)
Total Bilirubin: 0.3 mg/dL (ref 0.3–1.2)

## 2017-01-15 LAB — CBC
HCT: 36 % (ref 36.0–46.0)
Hemoglobin: 11.8 g/dL — ABNORMAL LOW (ref 12.0–15.0)
MCH: 29.1 pg (ref 26.0–34.0)
MCHC: 32.8 g/dL (ref 30.0–36.0)
MCV: 88.7 fL (ref 78.0–100.0)
PLATELETS: 382 10*3/uL (ref 150–400)
RBC: 4.06 MIL/uL (ref 3.87–5.11)
RDW: 14.9 % (ref 11.5–15.5)
WBC: 10.4 10*3/uL (ref 4.0–10.5)

## 2017-01-15 LAB — LIPASE, BLOOD: LIPASE: 17 U/L (ref 11–51)

## 2017-01-15 LAB — D-DIMER, QUANTITATIVE: D-Dimer, Quant: 0.3 ug/mL-FEU (ref 0.00–0.50)

## 2017-01-15 LAB — TROPONIN I: Troponin I: <0.03 ng/mL (ref <0.03)

## 2017-01-15 MED ORDER — ONDANSETRON 4 MG PO TBDP
4.0000 mg | ORAL_TABLET | Freq: Once | ORAL | Status: AC
  Administered 2017-01-15: 4 mg via ORAL
  Filled 2017-01-15: qty 1

## 2017-01-15 MED ORDER — CYCLOBENZAPRINE HCL 10 MG PO TABS: 10.0000 mg | ORAL_TABLET | Freq: Two times a day (BID) | ORAL | 0 refills | Status: DC | PRN

## 2017-01-15 MED ORDER — HYDROMORPHONE HCL 2 MG/ML IJ SOLN
2.0000 mg | Freq: Once | INTRAMUSCULAR | Status: AC
Start: 2017-01-15 — End: 2017-01-15
  Administered 2017-01-15: 2 mg via INTRAMUSCULAR
  Filled 2017-01-15: qty 1

## 2017-01-15 NOTE — Discharge Instructions (Signed)
Today's workup without any significant findings. Trial of the Flexeril for the bodyaches. Make an appointment to follow-up with your regular doctor.

## 2017-01-15 NOTE — ED Triage Notes (Signed)
Pt reports having multiple complaints today.  States she has been having numbness and tingling in bilateral feet/legs.  Left hip pain, shortness of breath, chest pain, and back pain for 3 days.  Pt unable to choose a chief complaint.

## 2017-01-15 NOTE — ED Provider Notes (Signed)
[facility] DEPT Provider Note   CSN: [id number] Arrival date & time: 01/15/17  1211   By signing my name below, I, Courtney Thornton, attest that this documentation has been prepared under the direction and in the presence of Fredia Sorrow, MD. Electronically Signed: Hilbert Thornton, Scribe. 01/15/17. 1:44 PM. History   Chief Complaint Chief Complaint  Patient presents with  . Chest Pain    The history is provided by the patient. No language interpreter was used.  Chest Pain   This is a new problem. The current episode started more than 2 days ago. The problem occurs constantly. The problem has been gradually worsening. The pain is at a severity of 10/10. Associated symptoms include back pain, headaches, nausea, numbness, shortness of breath and weakness. Pertinent negatives include no abdominal pain, no cough, no fever and no vomiting. She has tried nitroglycerin for the symptoms. The treatment provided no relief.  HPI Comments: BHUMI Thornton is a 51 y.o. female who presents to the Emergency Department complaining of constant left-sided chest pain for the past 3 days. She states that she has been having episodes of severe chest pain. The pain radiates to her left arm and back. She rates the pain 10/10. She has tried taking NTG with no significant relief. She also reports SOB, nausea, and numbness and tingling in bilateral legs. Patient states that she has a hx of MS. She has had chest pain in the past related to her MS. She had a cardiac catheterization at [facility] in 2017. She denies vomiting, cough, or abdominal pain.  Past Medical History:  Diagnosis Date  . Anxiety    takes Xanax daily  . Arthritis    shoulders  . Blood transfusion 2004  . Bruises easily    platelets are always high  . Cancer (HCC)    cervical  . Depression    takes Zoloft nightly  . Dizziness    hx of  . Emphysema   . Gastric ulcer   . GERD (gastroesophageal reflux disease)    takes  Omeprazole and Ranitidine daily  . Headache(784.0)   . Heart disease   . History of kidney stones 2000  . Hyperlipidemia    doesn't take any meds at present time  . Hypertension    takes Metoprolol and Lisinopril occ  . Insomnia    xanax and pain pills help  . Liver disease   . Migraines    last on e3/10/13  . Multiple sclerosis ([place])   . Neck pain    stenosis and radiculopathy  . PONV (postoperative nausea and vomiting)   . Stroke [facility])    TIA-22 yrs ago    Patient Active Problem List   Diagnosis Date Noted  . Adult failure to thrive 08/12/2016  . Change in bowel movement 08/12/2016  . Subacute confusional state 08/12/2016  . Leukocytosis 08/06/2016  . Hypokalemia 07/31/2016  . Hyponatremia 07/31/2016  . Alcohol abuse 07/31/2016  . Anemia 07/31/2016  . Diarrhea 07/31/2016  . Protein-calorie malnutrition, severe ([place]) 07/31/2016  . HTN (hypertension) 07/31/2016  . GERD (gastroesophageal reflux disease) 07/31/2016    Past Surgical History:  Procedure Laterality Date  . ABDOMINAL HYSTERECTOMY  2004   partial d/t fibroid tumors  . ANTERIOR CERVICAL DECOMP/DISCECTOMY FUSION  11/24/2011   Procedure: ANTERIOR CERVICAL DECOMPRESSION/DISCECTOMY FUSION 3 LEVELS;  Surgeon: Hosie Spangle, MD;  Location: [facility];  Service: Neurosurgery;  Laterality: N/A;  Cervical three-four, four-five,five-six anterior cervical decompression with fusion plating and bonegraft  .  BREAST SURGERY  2002   breast reduction  . CERVICAL CONE BIOPSY  20+yrs ago   d/t cervical cancer  . ECTOPIC PREGNANCY SURGERY  1987/1989  . right hand  90's   cyst removed from top of hand    OB History    No data available       Home Medications    Prior to Admission medications   Medication Sig Start Date End Date Taking? Authorizing Provider  acetaminophen (TYLENOL) 325 MG tablet Take 2 tablets (650 mg total) by mouth every 6 (six) hours as needed for mild pain (or Fever >/= 101). 08/04/16  Yes  Orvan Falconer, MD  albuterol (PROVENTIL HFA;VENTOLIN HFA) 108 (90 Base) MCG/ACT inhaler Inhale 2 puffs into the lungs every 6 (six) hours as needed. For shortness of breath 09/01/16  Yes Lauree Chandler, NP  lactulose (CHRONULAC) 10 GM/15ML solution Take 15 mLs (10 g total) by mouth daily. 09/01/16  Yes Lauree Chandler, NP  LORazepam (ATIVAN) 0.5 MG tablet Take 0.5 mg by mouth at bedtime. Stop Date: 09/07/16   Yes [provider]  Multiple Vitamin (MULITIVITAMIN WITH MINERALS) TABS Take 1 tablet by mouth daily.   Yes [provider]  naproxen (NAPROSYN) 500 MG tablet Take 1 tablet (500 mg total) by mouth 2 (two) times daily with a meal. 11/02/16  Yes Sanjuana Kava, MD  nitroGLYCERIN (NITROSTAT) 0.4 MG SL tablet Place 1 tablet (0.4 mg total) under the tongue every 5 (five) minutes as needed. For chest pain 09/01/16  Yes Lauree Chandler, NP  ranitidine (ZANTAC) 150 MG tablet Take 150 mg by mouth daily as needed for heartburn.    Yes [provider]  sertraline (ZOLOFT) 50 MG tablet Take 1 tablet (50 mg total) by mouth daily. depression 09/01/16  Yes Lauree Chandler, NP  spironolactone (ALDACTONE) 50 MG tablet Take 1 tablet (50 mg total) by mouth daily. 09/01/16  Yes Lauree Chandler, NP  cyclobenzaprine (FLEXERIL) 10 MG tablet Take 1 tablet (10 mg total) by mouth 2 (two) times daily as needed for muscle spasms. 01/15/17   Fredia Sorrow, MD    Family History Family History  Problem Relation Age of Onset  . Arthritis Father   . Cancer Father   . Anesthesia problems Neg Hx   . Hypotension Neg Hx   . Malignant hyperthermia Neg Hx   . Pseudochol deficiency Neg Hx     Social History Social History  Substance Use Topics  . Smoking status: Current Every Day Smoker    Packs/day: 1.00    Years: 32.00    Types: Cigarettes  . Smokeless tobacco: Never Used  . Alcohol use No     Comment: 1.5 40oz a day     Allergies   Codeine   Review of Systems Review  of Systems  Constitutional: Negative for fever.  HENT: Positive for nosebleeds. Negative for rhinorrhea and sore throat.   Eyes: Positive for visual disturbance.  Respiratory: Positive for shortness of breath. Negative for cough.   Cardiovascular: Positive for chest pain.  Gastrointestinal: Positive for nausea. Negative for abdominal pain, diarrhea and vomiting.  Genitourinary: Negative for dysuria and hematuria.  Musculoskeletal: Positive for arthralgias, back pain, joint swelling and myalgias.  Skin: Negative for rash.  Neurological: Positive for weakness, numbness and headaches.  Hematological: Does not bruise/bleed easily.  Psychiatric/Behavioral: Negative for confusion.  All other systems reviewed and are negative.    Physical Exam Updated Vital Signs BP 94/61  Pulse 73   Temp 98.3 F (36.8 C) (Oral)   Resp 14   Ht 5' (1.524 m)   Wt 48.5 kg   SpO2 100%   BMI 20.90 kg/m   Physical Exam  Constitutional: She is oriented to person, place, and time. She appears well-developed and well-nourished. No distress.  HENT:  Head: Normocephalic and atraumatic.  Eyes: EOM are normal. Pupils are equal, round, and reactive to light. No scleral icterus.  Neck: Normal range of motion.  Cardiovascular: Normal rate, regular rhythm and normal heart sounds.   Pulses:      Radial pulses are 1+ on the right side, and 1+ on the left side.       Dorsalis pedis pulses are 2+ on the right side, and 2+ on the left side.  Radial pulse 1+, bilaterally. DP pulses are 2+, bilaterally.  Pulmonary/Chest: Effort normal and breath sounds normal. She exhibits no tenderness.  Lungs clear. O2 sats 100%. No chest wall tenderness.   Abdominal: Soft. She exhibits no distension. There is no tenderness.  Abdomen is non-tender. Normal bowel sounds.   Musculoskeletal: Normal range of motion.  No pitting edema to legs.   Neurological: She is alert and oriented to person, place, and time.  Skin: Skin is warm and  dry.  Psychiatric: She has a normal mood and affect. Judgment normal.  Nursing note and vitals reviewed.  ED Treatments / Results  DIAGNOSTIC STUDIES: Oxygen Saturation is 100% on RA, normal by my interpretation.    COORDINATION OF CARE: 1:09 PM Discussed treatment plan with pt at bedside and pt agreed to plan. I will do a full cardiac work-up for the patient.  Labs (all labs ordered are listed, but only abnormal results are displayed) Labs Reviewed  BASIC METABOLIC PANEL - Abnormal; Notable for the following:       Result Value   Glucose, Bld 118 (*)    All other components within normal limits  CBC - Abnormal; Notable for the following:    Hemoglobin 11.8 (*)    All other components within normal limits  HEPATIC FUNCTION PANEL - Abnormal; Notable for the following:    Bilirubin, Direct <0.1 (*)    All other components within normal limits  TROPONIN I  D-DIMER, QUANTITATIVE (NOT AT [facility])  LIPASE, BLOOD    EKG  EKG Interpretation  Date/Time:  Saturday Jan 15 2017 12:21:42 EDT Ventricular Rate:  82 PR Interval:    QRS Duration: 77 QT Interval:  353 QTC Calculation: 413 R Axis:   61 Text Interpretation:  Sinus rhythm Confirmed by Rogene Houston  MD, [place] [facility] [phone]) on 01/15/2017 12:57:01 PM       Radiology Dg Chest 2 View  Result Date: 01/15/2017 CLINICAL DATA:  Chest pain EXAM: CHEST  2 VIEW COMPARISON:  08/06/2016 FINDINGS: Linear opacities at the bases that are new from prior. There is no edema, consolidation, effusion, or pneumothorax. Normal heart size and mediastinal contours. Mild thoracic dextrocurvature. EKG leads create artifact over the chest. IMPRESSION: Mild atelectasis at the bases. Electronically Signed   By: Monte Fantasia M.D.   On: 01/15/2017 13:05    Procedures Procedures (including critical care time)  Medications Ordered in ED Medications  HYDROmorphone (DILAUDID) injection 2 mg (2 mg Intramuscular Given 01/15/17 1349)  ondansetron (ZOFRAN-ODT)  disintegrating tablet 4 mg (4 mg Oral Given 01/15/17 1348)     Initial Impression / Assessment and Plan / ED Course  I have reviewed the triage vital signs and the nursing  notes.  Pertinent labs & imaging results that were available during my care of the patient were reviewed by me and considered in my medical decision making (see chart for details).    Extensive workup here for the chest pain and shortness of breath. Patient also gives history of MS as well as history of cardiac cath in the past. Unable to confirm that diagnosis even though it is listed on her problem list. Patient with recent MRI without any significant findings.  Workup here for the chest pain now d-dimer negative troponin negative chest pains been nonstop for 3 days. EKG without acute changes.  Patient has a past history of alcohol abuse. Patient recently in nursing facility. And has had outpatient rehabilitation.  Patient's labs here show significant improvement from November when she last was admitted to the hospital.  Patient requesting something for the bodyaches will give her a trial of Flexeril and have her follow-up with her primary care Dr. in [place].  Patient improved here significantly with pain medicine.   Final Clinical Impressions(s) / ED Diagnoses   Final diagnoses:  Precordial pain  Myalgia    New Prescriptions New Prescriptions   CYCLOBENZAPRINE (FLEXERIL) 10 MG TABLET    Take 1 tablet (10 mg total) by mouth 2 (two) times daily as needed for muscle spasms.   I personally performed the services described in this documentation, which was scribed in my presence. The recorded information has been reviewed and is accurate.      Fredia Sorrow, MD 01/15/17 1539

## 2017-01-20 ENCOUNTER — Ambulatory Visit: Payer: Medicare HMO | Admitting: Orthopaedic Surgery

## 2017-01-27 ENCOUNTER — Ambulatory Visit: Payer: Medicare HMO | Admitting: Orthopaedic Surgery

## 2017-02-03 ENCOUNTER — Ambulatory Visit (INDEPENDENT_AMBULATORY_CARE_PROVIDER_SITE_OTHER): Payer: Medicare HMO | Admitting: Orthopaedic Surgery

## 2017-02-03 ENCOUNTER — Encounter: Payer: Self-pay | Admitting: Orthopaedic Surgery

## 2017-02-03 ENCOUNTER — Ambulatory Visit (HOSPITAL_COMMUNITY): Payer: Medicare HMO

## 2017-02-03 ENCOUNTER — Encounter (HOSPITAL_COMMUNITY): Payer: Self-pay

## 2017-02-03 VITALS — BP 118/73 | HR 111 | Temp 97.9°F | Ht 61.0 in | Wt 113.0 lb

## 2017-02-03 DIAGNOSIS — R2681 Unsteadiness on feet: Secondary | ICD-10-CM | POA: Diagnosis present

## 2017-02-03 DIAGNOSIS — M25552 Pain in left hip: Secondary | ICD-10-CM | POA: Diagnosis present

## 2017-02-03 DIAGNOSIS — F17210 Nicotine dependence, cigarettes, uncomplicated: Secondary | ICD-10-CM

## 2017-02-03 DIAGNOSIS — M545 Low back pain: Secondary | ICD-10-CM | POA: Diagnosis present

## 2017-02-03 DIAGNOSIS — G8929 Other chronic pain: Secondary | ICD-10-CM

## 2017-02-03 DIAGNOSIS — M6281 Muscle weakness (generalized): Secondary | ICD-10-CM

## 2017-02-03 DIAGNOSIS — R262 Difficulty in walking, not elsewhere classified: Secondary | ICD-10-CM | POA: Diagnosis present

## 2017-02-03 NOTE — Patient Instructions (Signed)
  Supine HSS with Strap  Start: Position yourself on your back with a strap or towel placed around your foot.  Movement: Pull on the towel to raise your leg and feel a stretch on the back of the leg  Perform 2x/day, 2-3 reps for 30 seconds each   Supine Clamshell  Begin by lying flat on your back with your knees bent, feet together. The band should be tight around both knees so you're beginning the exercise with tension already. Keeping the feet together, one leg should stabilize while the other is falling out to the side against the resistance. Bring the knee back to center with control and repeat 10 times before switching to the other leg.   Perform 1x/day, 3 sets of 10 reps with the red band

## 2017-02-03 NOTE — Progress Notes (Signed)
PROCEDURE NOTE:  The patient request injection, verbal consent was obtained.  The left trochanteric area of the hip was prepped appropriately after time out was performed.   Sterile technique was observed and injection of 1 cc of Depo-Medrol 40 mg with several cc's of plain xylocaine. Anesthesia was provided by ethyl chloride and a 20-gauge needle was used to inject the hip area. The injection was tolerated well.  A band aid dressing was applied.  The patient was advised to apply ice later today and tomorrow to the injection sight as needed.  She continues to smoke.  She is to go to PT beginning today.  Encounter Diagnoses  Name Primary?  . Pain of left hip joint Yes  . Cigarette nicotine dependence without complication    Electronically Signed Sanjuana Kava, MD 5/24/201810:31 AM

## 2017-02-03 NOTE — Therapy (Signed)
[place], [place], [address] Phone: [phone]   Fax:  [phone]  Physical Therapy Evaluation  Patient Details  Name: Courtney [place] MRN: [id number] Date of Birth: 04-11-1966 Referring Provider: Sanjuana Kava, MD  Encounter Date: 02/03/2017      PT End of Session - 02/03/17 1430    Visit Number 1   Number of Visits 9   Date for PT Re-Evaluation 03/03/17   Authorization Type Medicare Part A and B   Authorization - Visit Number 1   Authorization - Number of Visits 10   PT Start Time 7412   PT Stop Time 1431   PT Time Calculation (min) 42 min   Equipment Utilized During Treatment Gait belt   Activity Tolerance Patient tolerated treatment well   Behavior During Therapy [facility] for tasks assessed/performed      Past Medical History:  Diagnosis Date  . Anxiety    takes Xanax daily  . Arthritis    shoulders  . Blood transfusion 2004  . Bruises easily    platelets are always high  . Cancer (HCC)    cervical  . Depression    takes Zoloft nightly  . Dizziness    hx of  . Emphysema   . Gastric ulcer   . GERD (gastroesophageal reflux disease)    takes Omeprazole and Ranitidine daily  . Headache(784.0)   . Heart disease   . History of kidney stones 2000  . Hyperlipidemia    doesn't take any meds at present time  . Hypertension    takes Metoprolol and Lisinopril occ  . Insomnia    xanax and pain pills help  . Liver disease   . Migraines    last on e3/10/13  . Multiple sclerosis ([place])   . Neck pain    stenosis and radiculopathy  . PONV (postoperative nausea and vomiting)   . Stroke [facility])    TIA-53 yrs ago    Past Surgical History:  Procedure Laterality Date  . ABDOMINAL HYSTERECTOMY  2004   partial d/t fibroid tumors  . ANTERIOR CERVICAL DECOMP/DISCECTOMY FUSION  11/24/2011   Procedure: ANTERIOR CERVICAL DECOMPRESSION/DISCECTOMY FUSION 3 LEVELS;  Surgeon: Hosie Spangle, MD;  Location:  [place] [facility];  Service: Neurosurgery;  Laterality: N/A;  Cervical three-four, four-five,five-six anterior cervical decompression with fusion plating and bonegraft  . BREAST SURGERY  2002   breast reduction  . CERVICAL CONE BIOPSY  20+yrs ago   d/t cervical cancer  . ECTOPIC PREGNANCY SURGERY  1987/1989  . right hand  90's   cyst removed from top of hand    There were no vitals filed for this visit.       Subjective Assessment - 02/03/17 1348    Subjective Pt states that she has been having terrible L hip pain for the past 3 years, but the last 2 have been devastating. She fell 3 years ago which is what caused her pain. She denies any falls in the last 6 months. She reports that standing, walking, and getting up are all very difficult for her to do. If she sits for a long time she stiffens up. She states that Tylenol or Advil help decrease her pain but otherwise there are no other relieving factors. She reports that her pain is located in her hip and it does radiate down her posterior and anterior leg, primarily in her back. She reports chronic LBP which has been going on since before her  initial fall. She states she got a shot in her hip this morning which has already started to help.   Pertinent History MS, ETOH abuse (per medical records), cervical CA, depression, emphysema, heart disease, HTN, TIA (22 years ago)   Limitations Lifting;Standing;Walking;House hold activities   How long can you sit comfortably? 15-20 mins and then she has to shift weight   How long can you stand comfortably? 5-10 mins   How long can you walk comfortably? less than a minute   Patient Stated Goals decrease pain   Currently in Pain? No/denies            [facility] PT Assessment - 02/03/17 0001      Assessment   Medical Diagnosis L hip pain   Referring Provider Sanjuana Kava, MD   Onset Date/Surgical Date 11/17/12   Hand Dominance Right   Next MD Visit 02/17/17   Prior Therapy yes for same issue; came here  for eval on 11/17/16 for same complaints but did not return following initial eval     Precautions   Precautions Fall     Restrictions   Weight Bearing Restrictions No     Balance Screen   Has the patient fallen in the past 6 months No   Has the patient had a decrease in activity level because of a fear of falling?  Yes   Is the patient reluctant to leave their home because of a fear of falling?  Yes     Prior Function   Vocation On disability     Observation/Other Assessments   Observations extended prone on elbows caused her L hip pain to centralize   Focus on Therapeutic Outcomes (FOTO)  70%     Sensation   Light Touch Impaired Detail   Light Touch Impaired Details Impaired LLE   Additional Comments L5 and S1 light touch diminshed on LLE     AROM   Left Hip External Rotation  --  WNL   Left Hip Internal Rotation  --  25% limited     Strength   Right Hip Flexion 4+/5   Right Hip Extension 4/5   Right Hip ABduction 4+/5   Left Hip Flexion 4+/5   Left Hip Extension 3/5   Left Hip ABduction 4/5   Right Knee Flexion 5/5   Right Knee Extension 5/5   Left Knee Flexion 5/5   Left Knee Extension 5/5   Right Ankle Dorsiflexion 4+/5   Left Ankle Dorsiflexion 4+/5     Flexibility   Soft Tissue Assessment /Muscle Length yes   Hamstrings tight BLE, L>R   Quadriceps +Ely's on L   Piriformis L tight     Palpation   Palpation comment CPAs hypomobile throughout lumbar spine, L4-5 recreated pt's hip pain, palpation to L gluteals increased soft tissue restrictions, tender and recreated pt's pain     Ambulation/Gait   Ambulation/Gait Yes   Ambulation Distance (Feet) 30 Feet  around gym   Assistive device Straight cane   Gait Comments BLE decreased hip extension, decreased heel strike (flat foot landing throughout), increased forward trunk flexion, increased unsteadiness, trendelenber on L, antalgic on L, decreased push-off bil throughout     Balance   Balance Assessed Yes      Static Standing Balance   Static Standing - Balance Support No upper extremity supported   Static Standing Balance -  Activities  Single Leg Stance - Right Leg;Single Leg Stance - Left Leg   Static Standing - Comment/# of  Minutes R: 2 seconds or < L: 2 seconds or <     Standardized Balance Assessment   Standardized Balance Assessment Five Times Sit to Stand;Timed Up and Go Test   Five times sit to stand comments  13.7 seconds from chair with no UE support     Timed Up and Go Test   TUG Normal TUG   Normal TUG (seconds) 18.6   TUG Comments with SPC             [facility] Adult PT Treatment/Exercise - 02/03/17 0001      Exercises   Exercises Knee/Hip     Knee/Hip Exercises: Stretches   Active Hamstring Stretch Left;1 rep;30 seconds  HEP demo     Knee/Hip Exercises: Supine   Other Supine Knee/Hip Exercises clamshells with GTB x 10 reps for HEP demo                PT Education - 02/03/17 1555    Education provided Yes   Education Details exam findings, POC, HEP   Person(s) Educated Patient   Methods Explanation;Demonstration;Handout   Comprehension Verbalized understanding;Returned demonstration          PT Short Term Goals - 02/03/17 1610      PT SHORT TERM GOAL #1   Title Pt will be independent with HEP and perform it consistently in order to maximize overall strength and balance.   Time 2   Period Weeks   Status New     PT SHORT TERM GOAL #2   Title Pt will have improved SLS to at least 10 seconds or more to demo improved balance and maximize gait.   Time 2   Period Weeks   Status New     PT SHORT TERM GOAL #3   Title Pt will have improved strength to at least 4/5 throughout BLE to maximize gait and overall function.    Time 2   Period Weeks   Status New           PT Long Term Goals - 02/03/17 1611      PT LONG TERM GOAL #1   Title Pt will improve 5xSTS time to <10 seconds to demonstrate improved overall power and balance to minimize risk  of falls.   Time 4   Period Weeks   Status New     PT LONG TERM GOAL #2   Title Pt will be able to perform SLS on BLE for 10 seconds or greater with no UE support to maximize gait and decrease risk of falls.   Time 4   Period Weeks   Status New     PT LONG TERM GOAL #3   Title Pt will have improved TUG to 12 seconds or < with LRAD to demonstrate improved overall gait and balance.   Time 4   Period Weeks   Status New               Plan - 02/03/17 1555    Clinical Impression Statement Pt is 51 YO F who presents to OPPT with c/o chronic L hip pain. Pt has deficits in strength, minor hip IR ROM deficits, balance, and gait as illustrated above. Pt did report that she also has chronic LBP; had pt peform extended prone on elbows and she reported centralization of her L hip pain following. Pt also presenting to therapy s/p L hip injection from earlier this morning, and she stated that it had already started to help her L hip  pain. PT asked pt at [facility] if she felt safe in her current relationship at home and she stated "sometimes I don't" so PT gave pt information with local resources for her to contact and she verbalized understanding. Pt would benefit from skilled PT services to maximize strength, balance, and gait.   Rehab Potential Fair   PT Frequency 2x / week   PT Duration 4 weeks   PT Treatment/Interventions ADLs/Self Care Home Management;DME Instruction;Gait training;Stair training;Functional mobility training;Therapeutic activities;Therapeutic exercise;Balance training;Neuromuscular re-education;Patient/family education;Manual techniques;Passive range of motion   PT Next Visit Plan review goals and HEP, piriformis stretch, manual hip distraction, hip IR PROM/mobs, bridges, SLR, sit <> stands, step ups, balance training, gait training   PT Home Exercise Plan eval: supine HS stretch, supine clamshells with GTB   Consulted and Agree with Plan of Care Patient      Patient will benefit  from skilled therapeutic intervention in order to improve the following deficits and impairments:  Abnormal gait, Decreased activity tolerance, Decreased balance, Decreased mobility, Decreased strength, Difficulty walking, Impaired UE functional use, Improper body mechanics, Postural dysfunction, Pain, Impaired flexibility  Visit Diagnosis: Unsteadiness on feet - Plan: PT plan of care cert/re-cert  Difficulty in walking, not elsewhere classified - Plan: PT plan of care cert/re-cert  Muscle weakness (generalized) - Plan: PT plan of care cert/re-cert  Pain in left hip - Plan: PT plan of care cert/re-cert  Chronic low back pain, unspecified back pain laterality, with sciatica presence unspecified - Plan: PT plan of care cert/re-cert      G-Codes - 85/02/77 1615    Functional Assessment Tool Used (Outpatient Only) FOTO, clinical judgement, MMT, SLS, 5xSTS, TUG   Functional Limitation Mobility: Walking and moving around   Mobility: Walking and Moving Around Current Status [phone]) At least 60 percent but less than 80 percent impaired, limited or restricted   Mobility: Walking and Moving Around Goal Status [phone]) At least 40 percent but less than 60 percent impaired, limited or restricted       Problem List Patient Active Problem List   Diagnosis Date Noted  . Adult failure to thrive 08/12/2016  . Change in bowel movement 08/12/2016  . Subacute confusional state 08/12/2016  . Leukocytosis 08/06/2016  . Hypokalemia 07/31/2016  . Hyponatremia 07/31/2016  . Alcohol abuse 07/31/2016  . Anemia 07/31/2016  . Diarrhea 07/31/2016  . Protein-calorie malnutrition, severe ([place]) 07/31/2016  . HTN (hypertension) 07/31/2016  . GERD (gastroesophageal reflux disease) 07/31/2016      Geraldine Solar PT, DPT    [facility] [address] [place], [place], [address] Phone: [phone]   Fax:  [phone]  Name: LEXIS POTENZA MRN:  [id number] Date of Birth: [birth year]

## 2017-02-03 NOTE — Patient Instructions (Signed)
Steps to Quit Smoking Smoking tobacco can be bad for your health. It can also affect almost every organ in your body. Smoking puts you and people around you at risk for many serious long-lasting (chronic) diseases. Quitting smoking is hard, but it is one of the best things that you can do for your health. It is never too late to quit. What are the benefits of quitting smoking? When you quit smoking, you lower your risk for getting serious diseases and conditions. They can include:  Lung cancer or lung disease.  Heart disease.  Stroke.  Heart attack.  Not being able to have children (infertility).  Weak bones (osteoporosis) and broken bones (fractures). If you have coughing, wheezing, and shortness of breath, those symptoms may get better when you quit. You may also get sick less often. If you are pregnant, quitting smoking can help to lower your chances of having a baby of low birth weight. What can I do to help me quit smoking? Talk with your doctor about what can help you quit smoking. Some things you can do (strategies) include:  Quitting smoking totally, instead of slowly cutting back how much you smoke over a period of time.  Going to in-person counseling. You are more likely to quit if you go to many counseling sessions.  Using resources and support systems, such as:  Online chats with a counselor.  Phone quitlines.  Printed self-help materials.  Support groups or group counseling.  Text messaging programs.  Mobile phone apps or applications.  Taking medicines. Some of these medicines may have nicotine in them. If you are pregnant or breastfeeding, do not take any medicines to quit smoking unless your doctor says it is okay. Talk with your doctor about counseling or other things that can help you. Talk with your doctor about using more than one strategy at the same time, such as taking medicines while you are also going to in-person counseling. This can help make quitting  easier. What things can I do to make it easier to quit? Quitting smoking might feel very hard at first, but there is a lot that you can do to make it easier. Take these steps:  Talk to your family and friends. Ask them to support and encourage you.  Call phone quitlines, reach out to support groups, or work with a counselor.  Ask people who smoke to not smoke around you.  Avoid places that make you want (trigger) to smoke, such as:  Bars.  Parties.  Smoke-break areas at work.  Spend time with people who do not smoke.  Lower the stress in your life. Stress can make you want to smoke. Try these things to help your stress:  Getting regular exercise.  Deep-breathing exercises.  Yoga.  Meditating.  Doing a body scan. To do this, close your eyes, focus on one area of your body at a time from head to toe, and notice which parts of your body are tense. Try to relax the muscles in those areas.  Download or buy apps on your mobile phone or tablet that can help you stick to your quit plan. There are many free apps, such as QuitGuide from the CDC (Centers for Disease Control and Prevention). You can find more support from smokefree.gov and other websites. This information is not intended to replace advice given to you by your health care provider. Make sure you discuss any questions you have with your health care provider. Document Released: 06/26/2009 Document Revised: 04/27/2016 Document   Reviewed: 01/14/2015 Elsevier Interactive Patient Education  2017 Elsevier Inc.  

## 2017-02-14 ENCOUNTER — Encounter: Payer: Self-pay | Admitting: Gastroenterology

## 2017-02-15 ENCOUNTER — Ambulatory Visit (HOSPITAL_COMMUNITY): Payer: Medicare HMO | Attending: Orthopaedic Surgery | Admitting: Physical Therapy

## 2017-02-15 DIAGNOSIS — R2681 Unsteadiness on feet: Secondary | ICD-10-CM | POA: Insufficient documentation

## 2017-02-15 DIAGNOSIS — R262 Difficulty in walking, not elsewhere classified: Secondary | ICD-10-CM

## 2017-02-15 DIAGNOSIS — M545 Low back pain: Secondary | ICD-10-CM | POA: Diagnosis present

## 2017-02-15 DIAGNOSIS — G8929 Other chronic pain: Secondary | ICD-10-CM | POA: Insufficient documentation

## 2017-02-15 DIAGNOSIS — M6281 Muscle weakness (generalized): Secondary | ICD-10-CM | POA: Diagnosis present

## 2017-02-15 DIAGNOSIS — M25552 Pain in left hip: Secondary | ICD-10-CM | POA: Diagnosis present

## 2017-02-15 NOTE — Therapy (Signed)
[facility] [address] [place], [place], [address] Phone: [phone]   Fax:  [phone]  Physical Therapy Treatment  Patient Details  Name: LAYCE SPRUNG MRN: [id number] Date of Birth: [birth year] Referring Provider: Sanjuana Kava, MD  Encounter Date: 02/15/2017      PT End of Session - 02/15/17 1646    Visit Number 2   Number of Visits 9   Date for PT Re-Evaluation 03/03/17   Authorization Type Medicare Part A and B   Authorization - Visit Number 2   Authorization - Number of Visits 10   PT Start Time 6440   PT Stop Time 1650  8 minutes was on elliptical   PT Time Calculation (min) 40 min   Equipment Utilized During Treatment Gait belt   Activity Tolerance Patient tolerated treatment well   Behavior During Therapy [facility] for tasks assessed/performed      Past Medical History:  Diagnosis Date  . Anxiety    takes Xanax daily  . Arthritis    shoulders  . Blood transfusion 2004  . Bruises easily    platelets are always high  . Cancer (HCC)    cervical  . Depression    takes Zoloft nightly  . Dizziness    hx of  . Emphysema   . Gastric ulcer   . GERD (gastroesophageal reflux disease)    takes Omeprazole and Ranitidine daily  . Headache(784.0)   . Heart disease   . History of kidney stones 2000  . Hyperlipidemia    doesn't take any meds at present time  . Hypertension    takes Metoprolol and Lisinopril occ  . Insomnia    xanax and pain pills help  . Liver disease   . Migraines    last on e3/10/13  . Multiple sclerosis ([place])   . Neck pain    stenosis and radiculopathy  . PONV (postoperative nausea and vomiting)   . Stroke [facility])    TIA-22 yrs ago    Past Surgical History:  Procedure Laterality Date  . ABDOMINAL HYSTERECTOMY  2004   partial d/t fibroid tumors  . ANTERIOR CERVICAL DECOMP/DISCECTOMY FUSION  11/24/2011   Procedure: ANTERIOR CERVICAL DECOMPRESSION/DISCECTOMY FUSION 3 LEVELS;  Surgeon:  Hosie Spangle, MD;  Location: [place] [facility];  Service: Neurosurgery;  Laterality: N/A;  Cervical three-four, four-five,five-six anterior cervical decompression with fusion plating and bonegraft  . BREAST SURGERY  2002   breast reduction  . CERVICAL CONE BIOPSY  20+yrs ago   d/t cervical cancer  . ECTOPIC PREGNANCY SURGERY  1987/1989  . right hand  90's   cyst removed from top of hand    There were no vitals filed for this visit.      Subjective Assessment - 02/15/17 1737    Subjective Pt states she is still having some soreness in her Lt hip but it is much better.  Reports she is completing her HEP.     Currently in Pain? No/denies                         [facility] Adult PT Treatment/Exercise - 02/15/17 0001      Knee/Hip Exercises: Stretches   Active Hamstring Stretch Both;2 reps;30 seconds   Piriformis Stretch Both;3 reps;30 seconds   Piriformis Stretch Limitations seated     Knee/Hip Exercises: Aerobic   Nustep 8 minutes LE's only     Knee/Hip Exercises: Standing   Gait Training without  AD 225 feet     Knee/Hip Exercises: Seated   Sit to Sand 10 reps;without UE support     Knee/Hip Exercises: Supine   Bridges Limitations 10 reps   Straight Leg Raises Both;10 reps   Other Supine Knee/Hip Exercises review for HEP: clamshells with GTB x 10 reps for HEP demo     Knee/Hip Exercises: Sidelying   Hip ABduction Both;10 reps     Knee/Hip Exercises: Prone   Hip Extension Both;10 reps                PT Education - 02/15/17 1635    Education provided Yes   Education Details reviewed HEP and goals per initial evaluation   Person(s) Educated Patient   Methods Explanation;Demonstration;Tactile cues;Verbal cues;Handout   Comprehension Verbalized understanding;Returned demonstration;Verbal cues required;Tactile cues required          PT Short Term Goals - 02/03/17 1610      PT SHORT TERM GOAL #1   Title Pt will be independent with HEP and  perform it consistently in order to maximize overall strength and balance.   Time 2   Period Weeks   Status New     PT SHORT TERM GOAL #2   Title Pt will have improved SLS to at least 10 seconds or more to demo improved balance and maximize gait.   Time 2   Period Weeks   Status New     PT SHORT TERM GOAL #3   Title Pt will have improved strength to at least 4/5 throughout BLE to maximize gait and overall function.    Time 2   Period Weeks   Status New           PT Long Term Goals - 02/03/17 1611      PT LONG TERM GOAL #1   Title Pt will improve 5xSTS time to <10 seconds to demonstrate improved overall power and balance to minimize risk of falls.   Time 4   Period Weeks   Status New     PT LONG TERM GOAL #2   Title Pt will be able to perform SLS on BLE for 10 seconds or greater with no UE support to maximize gait and decrease risk of falls.   Time 4   Period Weeks   Status New     PT LONG TERM GOAL #3   Title Pt will have improved TUG to 12 seconds or < with LRAD to demonstrate improved overall gait and balance.   Time 4   Period Weeks   Status New               Plan - 02/15/17 1731    Clinical Impression Statement reviewed evaluation goals and HEP. Pt able to recall 1/2 exercises correctly.  Pt was doing abdominal stabilization with B leg lowering instead of hamstring stretches.  Added bridge, hip abduction, hip extension, SLR, sit to stands and piriformis stretch to POC as instructed by therapist.  Also worked on gait as patient displays very stiff posturing with limited knee flexion and heel toe gait.  Pt did not c/o pain with any therex or with gait.  No increase pain at end of session.  Hip mobilization was not initiated this session.    Rehab Potential Fair   PT Frequency 2x / week   PT Duration 4 weeks   PT Treatment/Interventions ADLs/Self Care Home Management;DME Instruction;Gait training;Stair training;Functional mobility training;Therapeutic  activities;Therapeutic exercise;Balance training;Neuromuscular re-education;Patient/family education;Manual techniques;Passive range of motion  PT Next Visit Plan initiate manual to hip, including hip distraction and hip IR PROM/mobs as needed.  Continue to progress strength of bilateral hips/LE.  Next session begin step ups, balance training and continue to work on gait training   PT Home Exercise Plan eval: supine HS stretch, supine clamshells with GTB 6/5:  bridge, sit to stand   Consulted and Agree with Plan of Care Patient      Patient will benefit from skilled therapeutic intervention in order to improve the following deficits and impairments:  Abnormal gait, Decreased activity tolerance, Decreased balance, Decreased mobility, Decreased strength, Difficulty walking, Impaired UE functional use, Improper body mechanics, Postural dysfunction, Pain, Impaired flexibility  Visit Diagnosis: Unsteadiness on feet  Difficulty in walking, not elsewhere classified  Muscle weakness (generalized)  Pain in left hip     Problem List Patient Active Problem List   Diagnosis Date Noted  . Adult failure to thrive 08/12/2016  . Change in bowel movement 08/12/2016  . Subacute confusional state 08/12/2016  . Leukocytosis 08/06/2016  . Hypokalemia 07/31/2016  . Hyponatremia 07/31/2016  . Alcohol abuse 07/31/2016  . Anemia 07/31/2016  . Diarrhea 07/31/2016  . Protein-calorie malnutrition, severe ([place]) 07/31/2016  . HTN (hypertension) 07/31/2016  . GERD (gastroesophageal reflux disease) 07/31/2016    Teena Irani, PTA/CLT [phone]  02/15/2017, 5:39 PM  [facility] [address] [place], [place], [address] Phone: [phone]   Fax:  [phone]  Name: CATHARINA PICA MRN: [id number] Date of Birth: 1966-05-26

## 2017-02-15 NOTE — Patient Instructions (Signed)
Functional Quadriceps: Sit to Stand    Sit on edge of chair, feet flat on floor. Stand upright, extending knees fully. Repeat _10___ times per set. Do __2__ sessions per day.   Hamstring Stretch    With other leg bent, foot flat, grasp right leg and slowly try to straighten knee. Hold _30___ seconds.Repeat __3__ times. Do __2__ sessions per day.   Bridge   Lie back, legs bent. Inhale, pressing hips up. Keeping ribs in, lengthen lower back. Exhale, rolling down along spine from top.Repeat __10__ times. Do _2___ sessions per day.

## 2017-02-17 ENCOUNTER — Ambulatory Visit (HOSPITAL_COMMUNITY): Payer: Medicare HMO

## 2017-02-17 DIAGNOSIS — R262 Difficulty in walking, not elsewhere classified: Secondary | ICD-10-CM

## 2017-02-17 DIAGNOSIS — G8929 Other chronic pain: Secondary | ICD-10-CM

## 2017-02-17 DIAGNOSIS — M545 Low back pain: Secondary | ICD-10-CM

## 2017-02-17 DIAGNOSIS — M25552 Pain in left hip: Secondary | ICD-10-CM

## 2017-02-17 DIAGNOSIS — R2681 Unsteadiness on feet: Secondary | ICD-10-CM | POA: Diagnosis not present

## 2017-02-17 DIAGNOSIS — M6281 Muscle weakness (generalized): Secondary | ICD-10-CM
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
## 2017-02-22 ENCOUNTER — Ambulatory Visit (HOSPITAL_COMMUNITY): Payer: Medicare HMO | Admitting: Physical Therapy

## 2017-02-22 DIAGNOSIS — M25552 Pain in left hip: Secondary | ICD-10-CM

## 2017-02-22 DIAGNOSIS — M6281 Muscle weakness (generalized): Secondary | ICD-10-CM

## 2017-02-22 DIAGNOSIS — R2681 Unsteadiness on feet: Secondary | ICD-10-CM | POA: Diagnosis not present

## 2017-02-22 DIAGNOSIS — R262 Difficulty in walking, not elsewhere classified: Secondary | ICD-10-CM

## 2017-02-22 NOTE — Therapy (Signed)
[place], [place], [address] Phone: [phone]   Fax:  [phone]  Physical Therapy Treatment  Patient Details  Name: GENESE QUEBEDEAUX MRN: [id number] Date of Birth: December 17, 1965 Referring Provider: Sanjuana Kava, MD  Encounter Date: 02/22/2017      PT End of Session - 02/22/17 1639    Visit Number 4   Number of Visits 9   Date for PT Re-Evaluation 03/03/17   Authorization Type Medicare Part A and B   Authorization - Visit Number 4   Authorization - Number of Visits 10   PT Start Time 1600   PT Stop Time 1642   PT Time Calculation (min) 42 min   Equipment Utilized During Treatment Gait belt   Activity Tolerance Patient tolerated treatment well;No increased pain   Behavior During Therapy WFL for tasks assessed/performed      Past Medical History:  Diagnosis Date  . Anxiety    takes Xanax daily  . Arthritis    shoulders  . Blood transfusion 2004  . Bruises easily    platelets are always high  . Cancer (HCC)    cervical  . Depression    takes Zoloft nightly  . Dizziness    hx of  . Emphysema   . Gastric ulcer   . GERD (gastroesophageal reflux disease)    takes Omeprazole and Ranitidine daily  . Headache(784.0)   . Heart disease   . History of kidney stones 2000  . Hyperlipidemia    doesn't take any meds at present time  . Hypertension    takes Metoprolol and Lisinopril occ  . Insomnia    xanax and pain pills help  . Liver disease   . Migraines    last on e3/10/13  . Multiple sclerosis ([place])   . Neck pain    stenosis and radiculopathy  . PONV (postoperative nausea and vomiting)   . Stroke [facility])    TIA-22 yrs ago    Past Surgical History:  Procedure Laterality Date  . ABDOMINAL HYSTERECTOMY  2004   partial d/t fibroid tumors  . ANTERIOR CERVICAL DECOMP/DISCECTOMY FUSION  11/24/2011   Procedure: ANTERIOR CERVICAL DECOMPRESSION/DISCECTOMY FUSION 3 LEVELS;  Surgeon: Hosie Spangle, MD;  Location: [place] [facility];  Service: Neurosurgery;  Laterality: N/A;  Cervical three-four, four-five,five-six anterior cervical decompression with fusion plating and bonegraft  . BREAST SURGERY  2002   breast reduction  . CERVICAL CONE BIOPSY  20+yrs ago   d/t cervical cancer  . ECTOPIC PREGNANCY SURGERY  1987/1989  . right hand  90's   cyst removed from top of hand    There were no vitals filed for this visit.      Subjective Assessment - 02/22/17 1611    Subjective PT states she can feel it but it's not really a pain.  States she's been working on her walking and doing her HEP.     Currently in Pain? No/denies                         [facility] Adult PT Treatment/Exercise - 02/22/17 0001      Ambulation/Gait   Ambulation/Gait Yes   Ambulation Distance (Feet) 400 Feet   Assistive device Straight cane   Gait Pattern Step-through pattern   Gait Comments Cueing for heel to toe and equal stance phase/stride length     Knee/Hip Exercises: Stretches   Active Hamstring Stretch Both;3 reps;30 seconds  Knee/Hip Exercises: Standing   Heel Raises 20 reps   Heel Raises Limitations heel and toe raises   Lateral Step Up Both;15 reps;Step Height: 4";Hand Hold: 1   Forward Step Up Both;15 reps;Hand Hold: 1;Step Height: 6"   Step Down Both;15 reps;Hand Hold: 1;Step Height: 4"   Rocker Board 2 minutes   Rocker Board Limitations Rt/Lt and A/P   Gait Training tried without AD but too nervous/unsteady   Other Standing Knee Exercises tandem stance 3x 30" on foam     Knee/Hip Exercises: Seated   Sit to Sand 10 reps;without UE support                  PT Short Term Goals - 02/03/17 1610      PT SHORT TERM GOAL #1   Title Pt will be independent with HEP and perform it consistently in order to maximize overall strength and balance.   Time 2   Period Weeks   Status New     PT SHORT TERM GOAL #2   Title Pt will have improved SLS to at least 10 seconds  or more to demo improved balance and maximize gait.   Time 2   Period Weeks   Status New     PT SHORT TERM GOAL #3   Title Pt will have improved strength to at least 4/5 throughout BLE to maximize gait and overall function.    Time 2   Period Weeks   Status New           PT Long Term Goals - 02/03/17 1611      PT LONG TERM GOAL #1   Title Pt will improve 5xSTS time to <10 seconds to demonstrate improved overall power and balance to minimize risk of falls.   Time 4   Period Weeks   Status New     PT LONG TERM GOAL #2   Title Pt will be able to perform SLS on BLE for 10 seconds or greater with no UE support to maximize gait and decrease risk of falls.   Time 4   Period Weeks   Status New     PT LONG TERM GOAL #3   Title Pt will have improved TUG to 12 seconds or < with LRAD to demonstrate improved overall gait and balance.   Time 4   Period Weeks   Status New               Plan - 02/22/17 1641    Clinical Impression Statement continued with focus on improving LE strength and stability. PT unable to ambulate wihout AD using good form due to nervousness and stiff footing.  Worked on gait with SPC using heel toe gait. Added forward step downs and decreased UE assistance with step exercises.  PT overall compelted all exercises witohout difficulty.  Pt with noted work finding difficulty and was asking about ST to help her with this.  Passed along to evaluating therapist.     Rehab Potential Fair   Clinical Impairments Affecting Rehab Potential (-): comorbidities, chronicity of issue   PT Frequency 2x / week   PT Duration 4 weeks   PT Treatment/Interventions ADLs/Self Care Home Management;DME Instruction;Gait training;Stair training;Functional mobility training;Therapeutic activities;Therapeutic exercise;Balance training;Neuromuscular re-education;Patient/family education;Manual techniques;Passive range of motion   PT Next Visit Plan Progress strength of bilateral hips/LE.   Next session begin balance training and continue to work on gait training   PT Home Exercise Plan eval: supine HS stretch, supine  clamshells with GTB 6/5:  bridge, sit to stand      Patient will benefit from skilled therapeutic intervention in order to improve the following deficits and impairments:  Abnormal gait, Decreased activity tolerance, Decreased balance, Decreased mobility, Decreased strength, Difficulty walking, Impaired UE functional use, Improper body mechanics, Postural dysfunction, Pain, Impaired flexibility  Visit Diagnosis: Unsteadiness on feet  Difficulty in walking, not elsewhere classified  Muscle weakness (generalized)  Pain in left hip     Problem List Patient Active Problem List   Diagnosis Date Noted  . Adult failure to thrive 08/12/2016  . Change in bowel movement 08/12/2016  . Subacute confusional state 08/12/2016  . Leukocytosis 08/06/2016  . Hypokalemia 07/31/2016  . Hyponatremia 07/31/2016  . Alcohol abuse 07/31/2016  . Anemia 07/31/2016  . Diarrhea 07/31/2016  . Protein-calorie malnutrition, severe ([place]) 07/31/2016  . HTN (hypertension) 07/31/2016  . GERD (gastroesophageal reflux disease) 07/31/2016    Teena Irani, PTA/CLT [phone]  02/22/2017, 4:44 PM  [facility] [address] [place], [place], [address] Phone: [phone]   Fax:  [phone]  Name: ORVILLE WIDMANN MRN: [id number] Date of Birth: Aug 04, 1966

## 2017-02-24 ENCOUNTER — Ambulatory Visit (HOSPITAL_COMMUNITY): Payer: Medicare HMO

## 2017-02-24 DIAGNOSIS — R2681 Unsteadiness on feet: Secondary | ICD-10-CM

## 2017-02-24 DIAGNOSIS — M545 Low back pain: Secondary | ICD-10-CM

## 2017-02-24 DIAGNOSIS — R262 Difficulty in walking, not elsewhere classified: Secondary | ICD-10-CM

## 2017-02-24 DIAGNOSIS — M6281 Muscle weakness (generalized): Secondary | ICD-10-CM

## 2017-02-24 DIAGNOSIS — G8929 Other chronic pain: Secondary | ICD-10-CM

## 2017-02-24 DIAGNOSIS — M25552 Pain in left hip: Secondary | ICD-10-CM

## 2017-02-24 NOTE — Patient Instructions (Signed)
Tandem Stance    Right foot in front of left, heel touching toe both feet "straight ahead". Stand on Foot Triangle of Support with both feet. Balance in this position 30 seconds. Do with left foot in front of right.  Copyright  VHI. All rights reserved.   Single Leg Balance: Eyes Open    Stand on right leg with eyes open. Hold 30 seconds. 5 reps 2 times per day.  http://ggbe.exer.us/5   Copyright  VHI. All rights reserved.   

## 2017-02-24 NOTE — Therapy (Signed)
[place], [place], [address] Phone: [phone]   Fax:  [phone]  Physical Therapy Treatment  Patient Details  Name: Courtney Thornton MRN: [id number] Date of Birth: [birth year] Referring Provider: Sanjuana Kava, MD  Encounter Date: 02/24/2017      PT End of Session - 02/24/17 1657    Visit Number 5   Number of Visits 9   Date for PT Re-Evaluation 03/03/17   Authorization Type Medicare Part A and B   Authorization - Visit Number 5   Authorization - Number of Visits 10   PT Start Time 3825   PT Stop Time 1732   PT Time Calculation (min) 41 min   Equipment Utilized During Treatment Gait belt   Activity Tolerance Patient tolerated treatment well;No increased pain   Behavior During Therapy WFL for tasks assessed/performed      Past Medical History:  Diagnosis Date  . Anxiety    takes Xanax daily  . Arthritis    shoulders  . Blood transfusion 2004  . Bruises easily    platelets are always high  . Cancer (HCC)    cervical  . Depression    takes Zoloft nightly  . Dizziness    hx of  . Emphysema   . Gastric ulcer   . GERD (gastroesophageal reflux disease)    takes Omeprazole and Ranitidine daily  . Headache(784.0)   . Heart disease   . History of kidney stones 2000  . Hyperlipidemia    doesn't take any meds at present time  . Hypertension    takes Metoprolol and Lisinopril occ  . Insomnia    xanax and pain pills help  . Liver disease   . Migraines    last on e3/10/13  . Multiple sclerosis ([place])   . Neck pain    stenosis and radiculopathy  . PONV (postoperative nausea and vomiting)   . Stroke [facility])    TIA-22 yrs ago    Past Surgical History:  Procedure Laterality Date  . ABDOMINAL HYSTERECTOMY  2004   partial d/t fibroid tumors  . ANTERIOR CERVICAL DECOMP/DISCECTOMY FUSION  11/24/2011   Procedure: ANTERIOR CERVICAL DECOMPRESSION/DISCECTOMY FUSION 3 LEVELS;  Surgeon: Hosie Spangle, MD;  Location: [place] [facility];  Service: Neurosurgery;  Laterality: N/A;  Cervical three-four, four-five,five-six anterior cervical decompression with fusion plating and bonegraft  . BREAST SURGERY  2002   breast reduction  . CERVICAL CONE BIOPSY  20+yrs ago   d/t cervical cancer  . ECTOPIC PREGNANCY SURGERY  1987/1989  . right hand  90's   cyst removed from top of hand    There were no vitals filed for this visit.      Subjective Assessment - 02/24/17 1657    Subjective Pt entered dept ambulating no AD.  no reports of pain today.  Reports compliance with HEP.     Pertinent History MS, ETOH abuse (per medical records), cervical CA, depression, emphysema, heart disease, HTN, TIA (22 years ago)   Patient Stated Goals decrease pain   Currently in Pain? No/denies                         [facility] Adult PT Treatment/Exercise - 02/24/17 0001      Ambulation/Gait   Ambulation/Gait Yes   Ambulation Distance (Feet) 552 Feet   Assistive device None   Gait Pattern Step-through pattern   Gait Comments Cueing for heel to toe and  equal stance phase/stride length; proper UE sequence     Knee/Hip Exercises: Standing   Heel Raises 20 reps   Heel Raises Limitations heel and toe raises   Stairs Reciprocal pattern 3RT on 4in; 2RT on 7in step 1 HR A   Rocker Board 2 minutes   Rocker Board Limitations Rt/Lt and A/P   SLS 5 attempts: Lt 8", Rt 3"    Gait Training 552 without AD; improved stability   Other Standing Knee Exercises tandem stance 3x 30" on foam   Other Standing Knee Exercises tandem gait and sidestep RTB 2RT             Balance Exercises - 02/24/17 1758      Balance Exercises: Standing   Tandem Stance Eyes open;Foam/compliant surface;3 reps;30 secs   SLS 5 reps   Tandem Gait 2 reps   Sidestepping 2 reps;Theraband  RTB             PT Short Term Goals - 02/03/17 1610      PT SHORT TERM GOAL #1   Title Pt will be independent with HEP and  perform it consistently in order to maximize overall strength and balance.   Time 2   Period Weeks   Status New     PT SHORT TERM GOAL #2   Title Pt will have improved SLS to at least 10 seconds or more to demo improved balance and maximize gait.   Time 2   Period Weeks   Status New     PT SHORT TERM GOAL #3   Title Pt will have improved strength to at least 4/5 throughout BLE to maximize gait and overall function.    Time 2   Period Weeks   Status New           PT Long Term Goals - 02/03/17 1611      PT LONG TERM GOAL #1   Title Pt will improve 5xSTS time to <10 seconds to demonstrate improved overall power and balance to minimize risk of falls.   Time 4   Period Weeks   Status New     PT LONG TERM GOAL #2   Title Pt will be able to perform SLS on BLE for 10 seconds or greater with no UE support to maximize gait and decrease risk of falls.   Time 4   Period Weeks   Status New     PT LONG TERM GOAL #3   Title Pt will have improved TUG to 12 seconds or < with LRAD to demonstrate improved overall gait and balance.   Time 4   Period Weeks   Status New               Plan - 02/24/17 1823    Clinical Impression Statement Began session with gait training with no AD.  Pt presents with vast improvements with stability and confidence without AD, cueing to improve mechanics (especially heel to toe pattern and appropraite UE swing) and SBA for safety.  Progressed to reciprocal pattern stair training with good mechanics, SBA for safety and cueing for control descending stairs.  Added balance training activities wiht min A required for safety, pt given additional balance activities as HEP to begin at home.  Able to verbalize and demosntrate without questions.     Rehab Potential Fair   Clinical Impairments Affecting Rehab Potential (-): comorbidities, chronicity of issue   PT Frequency 2x / week   PT Duration 4 weeks  PT Treatment/Interventions ADLs/Self Care Home  Management;DME Instruction;Gait training;Stair training;Functional mobility training;Therapeutic activities;Therapeutic exercise;Balance training;Neuromuscular re-education;Patient/family education;Manual techniques;Passive range of motion   PT Next Visit Plan Progress strength of bilateral hips/LE.  Next session continue balance training and continue to work on gait training   PT Home Exercise Plan eval: supine HS stretch, supine clamshells with GTB 6/5:  bridge, sit to stand; 6/14 tandem stance and SLS      Patient will benefit from skilled therapeutic intervention in order to improve the following deficits and impairments:  Abnormal gait, Decreased activity tolerance, Decreased balance, Decreased mobility, Decreased strength, Difficulty walking, Impaired UE functional use, Improper body mechanics, Postural dysfunction, Pain, Impaired flexibility  Visit Diagnosis: Unsteadiness on feet  Difficulty in walking, not elsewhere classified  Muscle weakness (generalized)  Pain in left hip  Chronic low back pain, unspecified back pain laterality, with sciatica presence unspecified     Problem List Patient Active Problem List   Diagnosis Date Noted  . Adult failure to thrive 08/12/2016  . Change in bowel movement 08/12/2016  . Subacute confusional state 08/12/2016  . Leukocytosis 08/06/2016  . Hypokalemia 07/31/2016  . Hyponatremia 07/31/2016  . Alcohol abuse 07/31/2016  . Anemia 07/31/2016  . Diarrhea 07/31/2016  . Protein-calorie malnutrition, severe ([place]) 07/31/2016  . HTN (hypertension) 07/31/2016  . GERD (gastroesophageal reflux disease) 07/31/2016   Ihor Austin, LPTA; [place]  Aldona Lento 02/24/2017, 6:32 PM  [facility] [address] [place], [place], [address] Phone: [phone]   Fax:  [phone]  Name: ROSIELEE CORPORAN MRN: [id number] Date of Birth: June 30, 1966

## 2017-03-01 ENCOUNTER — Encounter (HOSPITAL_COMMUNITY): Payer: Self-pay

## 2017-03-01 ENCOUNTER — Ambulatory Visit (HOSPITAL_COMMUNITY): Payer: Medicare HMO

## 2017-03-01 DIAGNOSIS — R262 Difficulty in walking, not elsewhere classified: Secondary | ICD-10-CM

## 2017-03-01 DIAGNOSIS — R2681 Unsteadiness on feet: Secondary | ICD-10-CM

## 2017-03-01 DIAGNOSIS — M545 Low back pain: Secondary | ICD-10-CM

## 2017-03-01 DIAGNOSIS — G8929 Other chronic pain: Secondary | ICD-10-CM

## 2017-03-01 DIAGNOSIS — M6281 Muscle weakness (generalized): Secondary | ICD-10-CM

## 2017-03-01 DIAGNOSIS — M25552 Pain in left hip: Secondary | ICD-10-CM

## 2017-03-01 NOTE — Patient Instructions (Signed)
  LOOPED ELASTIC BAND HIP ABDUCTION  While standing with an elastic band looped around your ankles, move the target leg out to the side as shown.  Performs 1 time per day, 2-3 sets of 10 repetitions with the Red band. Perform at your kitchen sink so that you have something sturdy to hang onto   ELASTIC BAND LATERAL WALKS   With an elastic band around both ankles, walk to the side while keeping your feet spread apart. Keep your knees straight the entire time -- his are bent but you do it with your knees straight.  Performs 1 time per day, 2-3 sets of 10 repetitions with the Red band. Perform at your kitchen counter so that you have something sturdy to hang onto

## 2017-03-01 NOTE — Therapy (Signed)
[place], [place], [address] Phone: [phone]   Fax:  [phone]  Physical Therapy Treatment  Patient Details  Name: Courtney Thornton MRN: [id number] Date of Birth: [birth year] Referring Provider: Sanjuana Kava, MD  Encounter Date: 03/01/2017      PT End of Session - 03/01/17 1610    Visit Number 6   Number of Visits 9   Date for PT Re-Evaluation 03/03/17   Authorization Type Medicare Part A and B   Authorization - Visit Number 6   Authorization - Number of Visits 10   PT Start Time 1540   PT Stop Time 1650   PT Time Calculation (min) 43 min   Equipment Utilized During Treatment Gait belt   Activity Tolerance Patient tolerated treatment well;No increased pain   Behavior During Therapy WFL for tasks assessed/performed      Past Medical History:  Diagnosis Date  . Anxiety    takes Xanax daily  . Arthritis    shoulders  . Blood transfusion 2004  . Bruises easily    platelets are always high  . Cancer (HCC)    cervical  . Depression    takes Zoloft nightly  . Dizziness    hx of  . Emphysema   . Gastric ulcer   . GERD (gastroesophageal reflux disease)    takes Omeprazole and Ranitidine daily  . Headache(784.0)   . Heart disease   . History of kidney stones 2000  . Hyperlipidemia    doesn't take any meds at present time  . Hypertension    takes Metoprolol and Lisinopril occ  . Insomnia    xanax and pain pills help  . Liver disease   . Migraines    last on e3/10/13  . Multiple sclerosis ([place])   . Neck pain    stenosis and radiculopathy  . PONV (postoperative nausea and vomiting)   . Stroke [facility])    TIA-22 yrs ago    Past Surgical History:  Procedure Laterality Date  . ABDOMINAL HYSTERECTOMY  2004   partial d/t fibroid tumors  . ANTERIOR CERVICAL DECOMP/DISCECTOMY FUSION  11/24/2011   Procedure: ANTERIOR CERVICAL DECOMPRESSION/DISCECTOMY FUSION 3 LEVELS;  Surgeon: Hosie Spangle, MD;  Location: [facility];  Service: Neurosurgery;  Laterality: N/A;  Cervical three-four, four-five,five-six anterior cervical decompression with fusion plating and bonegraft  . BREAST SURGERY  2002   breast reduction  . CERVICAL CONE BIOPSY  20+yrs ago   d/t cervical cancer  . ECTOPIC PREGNANCY SURGERY  1987/1989  . right hand  90's   cyst removed from top of hand    There were no vitals filed for this visit.      Subjective Assessment - 03/01/17 1609    Subjective Pt presents to therapy without AD again. She denies any hip pain and report that she has been able to walk better without her [facility] without falls or close calls.    Pertinent History MS, ETOH abuse (per medical records), cervical CA, depression, emphysema, heart disease, HTN, TIA (22 years ago)   Patient Stated Goals decrease pain   Currently in Pain? No/denies             [facility] Adult PT Treatment/Exercise - 03/01/17 0001      Knee/Hip Exercises: Standing   Heel Raises 20 reps   Heel Raises Limitations heel and toe raises   Hip Flexion Stengthening;Both;1 set;10 reps   Hip Flexion Limitations marching with 3  second holds of contralateral limb   Hip Abduction Stengthening;Both;2 sets;10 reps   Abduction Limitations RTB   Lateral Step Up Both;1 set;10 reps;Hand Hold: 2;Step Height: 6"   Forward Step Up Both;1 set;10 reps;Hand Hold: 2;Step Height: 6"   Functional Squat 2 sets;10 reps   Functional Squat Limitations chair behind for form   Gait Training 678 ft without AD, improved heel to toe pattern but cues to improve bil arm swing   Other Standing Knee Exercises sidestepping with RTB 42ft x RT;         Balance Exercises - 03/01/17 1639      Balance Exercises: Standing   Tandem Stance Eyes open;Foam/compliant surface  OH lifts, trunk rot with 2#wt bar x 10 reps each   SLS with Vectors Solid surface;Intermittent upper extremity assist;5 reps             PT Education - 03/01/17 1622     Education provided Yes   Education Details gait pattern, add standing hip abd and sidestepping with RTB to HEP   Person(s) Educated Patient   Methods Explanation;Demonstration;Handout   Comprehension Verbalized understanding;Returned demonstration          PT Short Term Goals - 02/03/17 1610      PT SHORT TERM GOAL #1   Title Pt will be independent with HEP and perform it consistently in order to maximize overall strength and balance.   Time 2   Period Weeks   Status New     PT SHORT TERM GOAL #2   Title Pt will have improved SLS to at least 10 seconds or more to demo improved balance and maximize gait.   Time 2   Period Weeks   Status New     PT SHORT TERM GOAL #3   Title Pt will have improved strength to at least 4/5 throughout BLE to maximize gait and overall function.    Time 2   Period Weeks   Status New           PT Long Term Goals - 02/03/17 1611      PT LONG TERM GOAL #1   Title Pt will improve 5xSTS time to <10 seconds to demonstrate improved overall power and balance to minimize risk of falls.   Time 4   Period Weeks   Status New     PT LONG TERM GOAL #2   Title Pt will be able to perform SLS on BLE for 10 seconds or greater with no UE support to maximize gait and decrease risk of falls.   Time 4   Period Weeks   Status New     PT LONG TERM GOAL #3   Title Pt will have improved TUG to 12 seconds or < with LRAD to demonstrate improved overall gait and balance.   Time 4   Period Weeks   Status New               Plan - 03/01/17 1654    Clinical Impression Statement Session continued to focus on therex, gait training, and balance training. Pt did well with progressed therex, reporting no pain, but demo'd fatigue. She was able to ambualte 3 laps this date without AD and with improved heel to toe pattern, but did require min cues to improve arm swing. Pt had difficulty with dynamic balance this date. Continue POC as planned. Pt inquired about  speech referral; pt told that PT had reached out to the referring MD but no speech  referral had been signed yet. Will send off referral for doctor to sign.   Rehab Potential Fair   Clinical Impairments Affecting Rehab Potential (-): comorbidities, chronicity of issue   PT Frequency 2x / week   PT Duration 4 weeks   PT Treatment/Interventions ADLs/Self Care Home Management;DME Instruction;Gait training;Stair training;Functional mobility training;Therapeutic activities;Therapeutic exercise;Balance training;Neuromuscular re-education;Patient/family education;Manual techniques;Passive range of motion   PT Next Visit Plan Progress strength of bilateral hips/LE; add weights to sit <> stands, mini squats; add in lunging; continue gait training and continue to proress dynamic balance on foam   PT Home Exercise Plan eval: supine HS stretch, supine clamshells with GTB 6/5:  bridge, sit to stand; 6/14 tandem stance and SLS   Consulted and Agree with Plan of Care Patient      Patient will benefit from skilled therapeutic intervention in order to improve the following deficits and impairments:  Abnormal gait, Decreased activity tolerance, Decreased balance, Decreased mobility, Decreased strength, Difficulty walking, Impaired UE functional use, Improper body mechanics, Postural dysfunction, Pain, Impaired flexibility  Visit Diagnosis: Unsteadiness on feet  Difficulty in walking, not elsewhere classified  Muscle weakness (generalized)  Pain in left hip  Chronic low back pain, unspecified back pain laterality, with sciatica presence unspecified     Problem List Patient Active Problem List   Diagnosis Date Noted  . Adult failure to thrive 08/12/2016  . Change in bowel movement 08/12/2016  . Subacute confusional state 08/12/2016  . Leukocytosis 08/06/2016  . Hypokalemia 07/31/2016  . Hyponatremia 07/31/2016  . Alcohol abuse 07/31/2016  . Anemia 07/31/2016  . Diarrhea 07/31/2016  .  Protein-calorie malnutrition, severe ([place]) 07/31/2016  . HTN (hypertension) 07/31/2016  . GERD (gastroesophageal reflux disease) 07/31/2016     Geraldine Solar PT, DPT   [facility] [address] [place], [place], [address] Phone: [phone]   Fax:  [phone]  Name: Courtney Thornton MRN: [id number] Date of Birth: [birth year]

## 2017-03-02 ENCOUNTER — Other Ambulatory Visit: Payer: Self-pay | Admitting: Radiology

## 2017-03-03 ENCOUNTER — Ambulatory Visit (HOSPITAL_COMMUNITY): Payer: Medicare HMO

## 2017-03-03 ENCOUNTER — Encounter (HOSPITAL_COMMUNITY): Payer: Self-pay

## 2017-03-03 DIAGNOSIS — R2681 Unsteadiness on feet: Secondary | ICD-10-CM | POA: Diagnosis not present

## 2017-03-03 DIAGNOSIS — M6281 Muscle weakness (generalized): Secondary | ICD-10-CM

## 2017-03-03 DIAGNOSIS — R262 Difficulty in walking, not elsewhere classified: Secondary | ICD-10-CM

## 2017-03-03 DIAGNOSIS — M25552 Pain in left hip: Secondary | ICD-10-CM

## 2017-03-03 NOTE — Therapy (Signed)
[place], [place], [address] Phone: [phone]   Fax:  [phone]  Physical Therapy Treatment/Reassessment  Patient Details  Name: Courtney Thornton MRN: [id number] Date of Birth: 06-09-1966 Referring Provider: Sanjuana Kava, MD  Encounter Date: 03/03/2017      PT End of Session - 03/03/17 1606    Visit Number 7   Number of Visits 17   Date for PT Re-Evaluation 03/31/17   Authorization Type Medicare Part A and B (G Codes done on 11-30-2022 visit)   Authorization - Visit Number 7   Authorization - Number of Visits 17   PT Start Time 1602   PT Stop Time 1649   PT Time Calculation (min) 47 min   Equipment Utilized During Treatment Gait belt   Activity Tolerance Patient tolerated treatment well;No increased pain   Behavior During Therapy WFL for tasks assessed/performed      Past Medical History:  Diagnosis Date  . Anxiety    takes Xanax daily  . Arthritis    shoulders  . Blood transfusion 2004  . Bruises easily    platelets are always high  . Cancer (HCC)    cervical  . Depression    takes Zoloft nightly  . Dizziness    hx of  . Emphysema   . Gastric ulcer   . GERD (gastroesophageal reflux disease)    takes Omeprazole and Ranitidine daily  . Headache(784.0)   . Heart disease   . History of kidney stones 2000  . Hyperlipidemia    doesn't take any meds at present time  . Hypertension    takes Metoprolol and Lisinopril occ  . Insomnia    xanax and pain pills help  . Liver disease   . Migraines    last on e3/10/13  . Multiple sclerosis ([place])   . Neck pain    stenosis and radiculopathy  . PONV (postoperative nausea and vomiting)   . Stroke [facility])    TIA-22 yrs ago    Past Surgical History:  Procedure Laterality Date  . ABDOMINAL HYSTERECTOMY  2004   partial d/t fibroid tumors  . ANTERIOR CERVICAL DECOMP/DISCECTOMY FUSION  11/24/2011   Procedure: ANTERIOR CERVICAL DECOMPRESSION/DISCECTOMY  FUSION 3 LEVELS;  Surgeon: Hosie Spangle, MD;  Location: [facility] [place];  Service: Neurosurgery;  Laterality: N/A;  Cervical three-four, four-five,five-six anterior cervical decompression with fusion plating and bonegraft  . BREAST SURGERY  2002   breast reduction  . CERVICAL CONE BIOPSY  20+yrs ago   d/t cervical cancer  . ECTOPIC PREGNANCY SURGERY  1987/1989  . right hand  90's   cyst removed from top of hand    There were no vitals filed for this visit.      Subjective Assessment - 03/03/17 1603    Subjective Pt states taht she was sore following last session but she feels better today. She is in very little pain right now.    Pertinent History MS, ETOH abuse (per medical records), cervical CA, depression, emphysema, heart disease, HTN, TIA (22 years ago)   Limitations Lifting;Standing;Walking;House hold activities   How long can you sit comfortably? 15-20 mins and then she has to shift weight   How long can you stand comfortably? 5-10 mins   How long can you walk comfortably? less than a minute   Patient Stated Goals decrease pain   Currently in Pain? Yes   Pain Score 3    Pain Location Hip   Pain  Orientation Left   Pain Descriptors / Indicators Aching   Pain Type Chronic pain   Pain Onset More than a month ago   Pain Frequency Constant   Aggravating Factors  Moving around   Pain Relieving Factors sleeping with pillow between knees   Effect of Pain on Daily Activities difficulty performing ADLs            [facility] PT Assessment - 03/03/17 0001      Strength   Right Hip Flexion 5/5  was 4+   Right Hip Extension 4/5  was 4   Right Hip ABduction 4+/5  was 4+   Left Hip Flexion 5/5  was 4+   Left Hip Extension 4/5  was 3   Left Hip ABduction 4+/5  was 4   Right Ankle Dorsiflexion 5/5  was 4+   Left Ankle Dorsiflexion 5/5  was 4+     Standardized Balance Assessment   Standardized Balance Assessment Dynamic Gait Index;Berg Balance Test     Berg Balance Test    Sit to Stand Able to stand without using hands and stabilize independently   Standing Unsupported Able to stand safely 2 minutes   Sitting with Back Unsupported but Feet Supported on Floor or Stool Able to sit safely and securely 2 minutes   Stand to Sit Sits safely with minimal use of hands   Transfers Able to transfer safely, minor use of hands   Standing Unsupported with Eyes Closed Able to stand 10 seconds safely   Standing Ubsupported with Feet Together Able to place feet together independently and stand 1 minute safely   From Standing, Reach Forward with Outstretched Arm Can reach confidently >25 cm (10")   From Standing Position, Pick up Object from Floor Able to pick up shoe safely and easily   From Standing Position, Turn to Look Behind Over each Shoulder Looks behind from both sides and weight shifts well   Turn 360 Degrees Able to turn 360 degrees safely in 4 seconds or less   Standing Unsupported, Alternately Place Feet on Step/Stool Able to stand independently and safely and complete 8 steps in 20 seconds   Standing Unsupported, One Foot in Front Able to plae foot ahead of the other independently and hold 30 seconds   Standing on One Leg Able to lift leg independently and hold equal to or more than 3 seconds   Total Score 53     Dynamic Gait Index   Level Surface Mild Impairment   Change in Gait Speed Mild Impairment   Gait with Horizontal Head Turns Mild Impairment   Gait with Vertical Head Turns Mild Impairment   Gait and Pivot Turn Normal   Step Over Obstacle Normal   Step Around Obstacles Normal   Steps Mild Impairment   Total Score 19                     [facility] Adult PT Treatment/Exercise - 03/03/17 0001      Knee/Hip Exercises: Standing   Heel Raises 20 reps   Heel Raises Limitations heel and toe raises                PT Education - 03/03/17 1653    Education provided Yes   Education Details reassessment findings, POC   Person(s)  Educated Patient   Methods Explanation   Comprehension Verbalized understanding          PT Short Term Goals - 03/03/17 1607  PT SHORT TERM GOAL #1   Title Pt will be independent with HEP and perform it consistently in order to maximize overall strength and balance.   Baseline 6/21 compliant with HEP   Time 2   Period Weeks   Status Achieved     PT SHORT TERM GOAL #2   Title Pt will have improved strength to at least 5/5 throughout BLE to maximize gait and overall function.    Baseline 6/21: proximal hip musculature still weak, but pt has made improvements in almost all MMT (original goal was 4/5, assume this was typo as pt only had one 3/5 MMT, all others were 4 to 4+)   Time 2   Period Weeks   Status Revised     PT SHORT TERM GOAL #3   Title Pt will have improved 3MWT by 186ft or > with minimal to no pain in her L hip and with minimal to no gait deviations to demonstrate improved overall functional strength, balance, and gait and to promote pt's participation in a regular walking program.   Baseline --   Time 2   Period Weeks   Status New           PT Long Term Goals - 03/03/17 1609      PT LONG TERM GOAL #1   Title Pt will improve 5xSTS time to <10 seconds to demonstrate improved overall power and balance to minimize risk of falls.   Baseline 6/21: 9.3 sec from chair with no UE   Time 4   Period Weeks   Status Achieved     PT LONG TERM GOAL #2   Title Pt will be able to perform SLS on BLE for 10 seconds or greater with no UE support to maximize gait and decrease risk of falls.   Baseline 6/21: L: 7 sec or less, R: 12 sec or less but unsteady during   Time 4   Period Weeks   Status On-going     PT LONG TERM GOAL #3   Title Pt will have improved TUG to 12 seconds or < with LRAD to demonstrate improved overall gait and balance.   Baseline 6/21: 13.18 sec trial 1, 11.2 sec trial 2 without AD   Time 4   Period Weeks   Status Achieved     PT LONG TERM GOAL  #4   Title Pt will have improved DGI to 22/24 to demonstrate improved overall strength and balance and to decrease risk for falls.   Time 4   Period Weeks   Status New     PT LONG TERM GOAL #5   Title Pt will have report participating in a regular walking program and exercise program for at least 3x/week to promote recovery and maximize participation in the community.   Time 4   Period Weeks   Status New               Plan - 03/03/17 1653    Clinical Impression Statement PT reassessed pt's goals and outcome measures this date. Pt has made great progress towards all goals, meeting 3/5, while the other 2 are still on-going. Her proximal hip strength, balance, and gait are still limited, but have all improved since starting therapy. Performed 3MWT, DGI, and BERG with pt this date. She tested out of the BERG, scoring 53/56, but her DGI score was 19/24 indicating increased risk for falls. Updated pt's goals this date and recommend extending PT services to continue to improve pt's  overall strength, balance, and gait in order to maximize function at home and in the community.   Rehab Potential Fair   Clinical Impairments Affecting Rehab Potential (-): comorbidities, chronicity of issue   PT Frequency 2x / week   PT Duration 4 weeks   PT Treatment/Interventions ADLs/Self Care Home Management;DME Instruction;Gait training;Stair training;Functional mobility training;Therapeutic activities;Therapeutic exercise;Balance training;Neuromuscular re-education;Patient/family education;Manual techniques;Passive range of motion   PT Next Visit Plan Progress strength of bilateral hips/LE; add weights to sit <> stands, mini squats; add in lunging; continue gait training and continue to proress dynamic balance on foam   PT Home Exercise Plan eval: supine HS stretch, supine clamshells with GTB 6/5:  bridge, sit to stand; 6/14 tandem stance and SLS   Consulted and Agree with Plan of Care Patient       Patient will benefit from skilled therapeutic intervention in order to improve the following deficits and impairments:  Abnormal gait, Decreased activity tolerance, Decreased balance, Decreased mobility, Decreased strength, Difficulty walking, Impaired UE functional use, Improper body mechanics, Postural dysfunction, Pain, Impaired flexibility  Visit Diagnosis: Unsteadiness on feet - Plan: PT plan of care cert/re-cert  Difficulty in walking, not elsewhere classified - Plan: PT plan of care cert/re-cert  Muscle weakness (generalized) - Plan: PT plan of care cert/re-cert  Pain in left hip - Plan: PT plan of care cert/re-cert       G-Codes - 03-12-17 1701    Functional Assessment Tool Used (Outpatient Only) FOTO, clinical judgement, MMT, SLS, 5xSTS, TUG   Functional Limitation Mobility: Walking and moving around   Mobility: Walking and Moving Around Current Status (T4196) At least 20 percent but less than 40 percent impaired, limited or restricted   Mobility: Walking and Moving Around Goal Status (Q2297) At least 1 percent but less than 20 percent impaired, limited or restricted      Problem List Patient Active Problem List   Diagnosis Date Noted  . Adult failure to thrive 08/12/2016  . Change in bowel movement 08/12/2016  . Subacute confusional state 08/12/2016  . Leukocytosis 08/06/2016  . Hypokalemia 07/31/2016  . Hyponatremia 07/31/2016  . Alcohol abuse 07/31/2016  . Anemia 07/31/2016  . Diarrhea 07/31/2016  . Protein-calorie malnutrition, severe ([place]) 07/31/2016  . HTN (hypertension) 07/31/2016  . GERD (gastroesophageal reflux disease) 07/31/2016     Geraldine Solar PT, DPT   [facility] [address] [place], [place], [address] Phone: [phone]   Fax:  [phone]  Name: Courtney Thornton MRN: [id number] Date of Birth: [birth year]

## 2017-03-08 ENCOUNTER — Ambulatory Visit (HOSPITAL_COMMUNITY): Payer: Medicare HMO

## 2017-03-08 DIAGNOSIS — R2681 Unsteadiness on feet: Secondary | ICD-10-CM | POA: Diagnosis not present

## 2017-03-08 DIAGNOSIS — M25552 Pain in left hip: Secondary | ICD-10-CM

## 2017-03-08 DIAGNOSIS — M6281 Muscle weakness (generalized): Secondary | ICD-10-CM

## 2017-03-08 DIAGNOSIS — R262 Difficulty in walking, not elsewhere classified: Secondary | ICD-10-CM

## 2017-03-08 NOTE — Therapy (Signed)
[facility] [place], [place], [address] Phone: [phone]   Fax:  [phone]  Physical Therapy Treatment  Patient Details  Name: Courtney Thornton MRN: [id number] Date of Birth: [birth year] Referring Provider: Sanjuana Kava, MD  Encounter Date: 03/08/2017      PT End of Session - 03/08/17 1611    Visit Number 8   Number of Visits 17   Date for PT Re-Evaluation 03/31/17   Authorization Type Medicare Part A and B (G Codes done on 12-13-22 visit)   Authorization - Visit Number 8   Authorization - Number of Visits 17   PT Start Time 8563   PT Stop Time 1644   PT Time Calculation (min) 39 min   Equipment Utilized During Treatment Gait belt   Activity Tolerance Patient tolerated treatment well;No increased pain   Behavior During Therapy WFL for tasks assessed/performed      Past Medical History:  Diagnosis Date  . Anxiety    takes Xanax daily  . Arthritis    shoulders  . Blood transfusion 2004  . Bruises easily    platelets are always high  . Cancer (HCC)    cervical  . Depression    takes Zoloft nightly  . Dizziness    hx of  . Emphysema   . Gastric ulcer   . GERD (gastroesophageal reflux disease)    takes Omeprazole and Ranitidine daily  . Headache(784.0)   . Heart disease   . History of kidney stones 2000  . Hyperlipidemia    doesn't take any meds at present time  . Hypertension    takes Metoprolol and Lisinopril occ  . Insomnia    xanax and pain pills help  . Liver disease   . Migraines    last on e3/10/13  . Multiple sclerosis ([place])   . Neck pain    stenosis and radiculopathy  . PONV (postoperative nausea and vomiting)   . Stroke [facility])    TIA-22 yrs ago    Past Surgical History:  Procedure Laterality Date  . ABDOMINAL HYSTERECTOMY  2004   partial d/t fibroid tumors  . ANTERIOR CERVICAL DECOMP/DISCECTOMY FUSION  11/24/2011   Procedure: ANTERIOR CERVICAL DECOMPRESSION/DISCECTOMY FUSION 3  LEVELS;  Surgeon: Hosie Spangle, MD;  Location: [facility]UR[facility] ORS;  Service: Neurosurgery;  Laterality: N/A;  Cervical three-four, four-five,five-six anterior cervical decompression with fusion plating and bonegraft  . BREAST SURGERY  2002   breast reduction  . CERVICAL CONE BIOPSY  20+yrs ago   d/t cervical cancer  . ECTOPIC PREGNANCY SURGERY  1987/1989  . right hand  90's   cyst removed from top of hand    There were no vitals filed for this visit.      Subjective Assessment - 03/08/17 1611    Subjective Pt stated she is feeling good today, no reports of pain and feels her balance is making great progress.     Pertinent History MS, ETOH abuse (per medical records), cervical CA, depression, emphysema, heart disease, HTN, TIA (22 years ago)   Patient Stated Goals decrease pain   Currently in Pain? No/denies                         [facility] Adult PT Treatment/Exercise - 03/08/17 0001      Knee/Hip Exercises: Standing   Forward Lunges Both;15 reps   Forward Lunges Limitations 6in step   Functional Squat 2 sets;10 reps  Functional Squat Limitations chair behind for form 4# BUE   Gait Training DGI activities 47min to improve confidence with different cadence, head turns, stop and go with min A   Other Standing Knee Exercises sidestepping with RTB 50ft x 1RT;             Balance Exercises - 03/08/17 1636      Balance Exercises: Standing   Tandem Stance Eyes open;Foam/compliant surface   SLS Eyes open;5 reps  BLE 5" max   SLS with Vectors Solid surface;Intermittent upper extremity assist;5 reps  5x 5" BLE   Balance Beam tandem 2Rt   Tandem Gait 2 reps   Sidestepping 2 reps;Theraband  GTB             PT Short Term Goals - 03/03/17 1607      PT SHORT TERM GOAL #1   Title Pt will be independent with HEP and perform it consistently in order to maximize overall strength and balance.   Baseline 6/21 compliant with HEP   Time 2   Period Weeks   Status  Achieved     PT SHORT TERM GOAL #2   Title Pt will have improved strength to at least 5/5 throughout BLE to maximize gait and overall function.    Baseline 6/21: proximal hip musculature still weak, but pt has made improvements in almost all MMT (original goal was 4/5, assume this was typo as pt only had one 3/5 MMT, all others were 4 to 4+)   Time 2   Period Weeks   Status Revised     PT SHORT TERM GOAL #3   Title Pt will have improved 3MWT by 136ft or > with minimal to no pain in her L hip and with minimal to no gait deviations to demonstrate improved overall functional strength, balance, and gait and to promote pt's participation in a regular walking program.   Baseline --   Time 2   Period Weeks   Status New           PT Long Term Goals - 03/03/17 1609      PT LONG TERM GOAL #1   Title Pt will improve 5xSTS time to <10 seconds to demonstrate improved overall power and balance to minimize risk of falls.   Baseline 6/21: 9.3 sec from chair with no UE   Time 4   Period Weeks   Status Achieved     PT LONG TERM GOAL #2   Title Pt will be able to perform SLS on BLE for 10 seconds or greater with no UE support to maximize gait and decrease risk of falls.   Baseline 6/21: L: 7 sec or less, R: 12 sec or less but unsteady during   Time 4   Period Weeks   Status On-going     PT LONG TERM GOAL #3   Title Pt will have improved TUG to 12 seconds or < with LRAD to demonstrate improved overall gait and balance.   Baseline 6/21: 13.18 sec trial 1, 11.2 sec trial 2 without AD   Time 4   Period Weeks   Status Achieved     PT LONG TERM GOAL #4   Title Pt will have improved DGI to 22/24 to demonstrate improved overall strength and balance and to decrease risk for falls.   Time 4   Period Weeks   Status New     PT LONG TERM GOAL #5   Title Pt will have report participating in  a regular walking program and exercise program for at least 3x/week to promote recovery and maximize  participation in the community.   Time 4   Period Weeks   Status New               Plan - 03/08/17 1747    Clinical Impression Statement Session focus on functional strengthening, stability and balance activities to address gait mechanics.  DGI activities were complete with SBA for LOB episodes, very minimal through session.  Added functional strenghtening activities without HHA to improve confidence with task (heel raises lifting ball into top cabinet, etc).  Pt able to demonstrate vast improvements with proper gait mechanics, minimal LOB episodes through session and improved confidence on dynamic surface.  No reports of pain through session, was limited by fatigue/     Rehab Potential Fair   Clinical Impairments Affecting Rehab Potential (-): comorbidities, chronicity of issue   PT Frequency 2x / week   PT Duration 4 weeks   PT Treatment/Interventions ADLs/Self Care Home Management;DME Instruction;Gait training;Stair training;Functional mobility training;Therapeutic activities;Therapeutic exercise;Balance training;Neuromuscular re-education;Patient/family education;Manual techniques;Passive range of motion   PT Next Visit Plan Progress functional strengthening wiht increased demand and least HHA.  Begin sidestepping and retro gait on balance beam.  COntinue gait training with DGI activities.     PT Home Exercise Plan eval: supine HS stretch, supine clamshells with GTB 6/5:  bridge, sit to stand; 6/14 tandem stance and SLS      Patient will benefit from skilled therapeutic intervention in order to improve the following deficits and impairments:  Abnormal gait, Decreased activity tolerance, Decreased balance, Decreased mobility, Decreased strength, Difficulty walking, Impaired UE functional use, Improper body mechanics, Postural dysfunction, Pain, Impaired flexibility  Visit Diagnosis: Unsteadiness on feet  Difficulty in walking, not elsewhere classified  Muscle weakness  (generalized)  Pain in left hip     Problem List Patient Active Problem List   Diagnosis Date Noted  . Adult failure to thrive 08/12/2016  . Change in bowel movement 08/12/2016  . Subacute confusional state 08/12/2016  . Leukocytosis 08/06/2016  . Hypokalemia 07/31/2016  . Hyponatremia 07/31/2016  . Alcohol abuse 07/31/2016  . Anemia 07/31/2016  . Diarrhea 07/31/2016  . Protein-calorie malnutrition, severe ([place]) 07/31/2016  . HTN (hypertension) 07/31/2016  . GERD (gastroesophageal reflux disease) 07/31/2016   Ihor Austin, [place]; [place]  Aldona Lento 03/08/2017, 5:52 PM  [facility] [address] [place], [place], [address] Phone: [phone]   Fax:  [phone]  Name: Courtney Thornton MRN: [id number] Date of Birth: Dec 05, 1965

## 2017-03-10 ENCOUNTER — Ambulatory Visit (INDEPENDENT_AMBULATORY_CARE_PROVIDER_SITE_OTHER): Payer: Medicare HMO | Admitting: Orthopaedic Surgery

## 2017-03-10 ENCOUNTER — Encounter: Payer: Self-pay | Admitting: Orthopaedic Surgery

## 2017-03-10 VITALS — BP 94/66 | HR 107 | Temp 97.3°F | Ht 61.0 in | Wt 115.0 lb

## 2017-03-10 DIAGNOSIS — M25511 Pain in right shoulder: Secondary | ICD-10-CM

## 2017-03-10 DIAGNOSIS — R2681 Unsteadiness on feet: Secondary | ICD-10-CM | POA: Diagnosis not present

## 2017-03-10 DIAGNOSIS — F17210 Nicotine dependence, cigarettes, uncomplicated: Secondary | ICD-10-CM | POA: Diagnosis not present

## 2017-03-10 DIAGNOSIS — M25552 Pain in left hip: Secondary | ICD-10-CM

## 2017-03-10 DIAGNOSIS — M25562 Pain in left knee: Secondary | ICD-10-CM

## 2017-03-10 DIAGNOSIS — G8929 Other chronic pain: Secondary | ICD-10-CM

## 2017-03-10 DIAGNOSIS — M25561 Pain in right knee: Secondary | ICD-10-CM

## 2017-03-10 DIAGNOSIS — M25551 Pain in right hip: Secondary | ICD-10-CM

## 2017-03-10 NOTE — Patient Instructions (Signed)
Steps to Quit Smoking Smoking tobacco can be bad for your health. It can also affect almost every organ in your body. Smoking puts you and people around you at risk for many serious long-lasting (chronic) diseases. Quitting smoking is hard, but it is one of the best things that you can do for your health. It is never too late to quit. What are the benefits of quitting smoking? When you quit smoking, you lower your risk for getting serious diseases and conditions. They can include:  Lung cancer or lung disease.  Heart disease.  Stroke.  Heart attack.  Not being able to have children (infertility).  Weak bones (osteoporosis) and broken bones (fractures).  If you have coughing, wheezing, and shortness of breath, those symptoms may get better when you quit. You may also get sick less often. If you are pregnant, quitting smoking can help to lower your chances of having a baby of low birth weight. What can I do to help me quit smoking? Talk with your doctor about what can help you quit smoking. Some things you can do (strategies) include:  Quitting smoking totally, instead of slowly cutting back how much you smoke over a period of time.  Going to in-person counseling. You are more likely to quit if you go to many counseling sessions.  Using resources and support systems, such as: ? Online chats with a counselor. ? Phone quitlines. ? Printed self-help materials. ? Support groups or group counseling. ? Text messaging programs. ? Mobile phone apps or applications.  Taking medicines. Some of these medicines may have nicotine in them. If you are pregnant or breastfeeding, do not take any medicines to quit smoking unless your doctor says it is okay. Talk with your doctor about counseling or other things that can help you.  Talk with your doctor about using more than one strategy at the same time, such as taking medicines while you are also going to in-person counseling. This can help make  quitting easier. What things can I do to make it easier to quit? Quitting smoking might feel very hard at first, but there is a lot that you can do to make it easier. Take these steps:  Talk to your family and friends. Ask them to support and encourage you.  Call phone quitlines, reach out to support groups, or work with a counselor.  Ask people who smoke to not smoke around you.  Avoid places that make you want (trigger) to smoke, such as: ? Bars. ? Parties. ? Smoke-break areas at work.  Spend time with people who do not smoke.  Lower the stress in your life. Stress can make you want to smoke. Try these things to help your stress: ? Getting regular exercise. ? Deep-breathing exercises. ? Yoga. ? Meditating. ? Doing a body scan. To do this, close your eyes, focus on one area of your body at a time from head to toe, and notice which parts of your body are tense. Try to relax the muscles in those areas.  Download or buy apps on your mobile phone or tablet that can help you stick to your quit plan. There are many free apps, such as QuitGuide from the CDC (Centers for Disease Control and Prevention). You can find more support from smokefree.gov and other websites.  This information is not intended to replace advice given to you by your health care provider. Make sure you discuss any questions you have with your health care provider. Document Released: 06/26/2009 Document   Revised: 04/27/2016 Document Reviewed: 01/14/2015 Elsevier Interactive Patient Education  2018 Elsevier Inc.  

## 2017-03-10 NOTE — Progress Notes (Signed)
Patient Courtney Thornton, female DOB:08-09-1966, 51 y.o. JJH:[id number]  Chief Complaint  Patient presents with  . Follow-up    left hip     HPI  Courtney Thornton is a 51 y.o. female who has left hip pain, shoulder pain on the right and gait problems.  She is much improved after going to PT.  She has made significant progress.  She is walking much better.  She has less pain.  She has no new trauma.  She has no paresthesias.  I have read her PT notes.  She is still smoking but cutting back. HPI  Body mass index is 21.73 kg/m.  ROS  Review of Systems  HENT: Negative for congestion.   Respiratory: Positive for shortness of breath. Negative for cough.   Cardiovascular: Negative for chest pain and leg swelling.  Endocrine: Positive for cold intolerance.  Musculoskeletal: Positive for neck pain. Negative for arthralgias and gait problem.  Allergic/Immunologic: Positive for environmental allergies.  Neurological: Positive for headaches.  Psychiatric/Behavioral: The patient is nervous/anxious.     Past Medical History:  Diagnosis Date  . Anxiety    takes Xanax daily  . Arthritis    shoulders  . Blood transfusion 2004  . Bruises easily    platelets are always high  . Cancer (HCC)    cervical  . Depression    takes Zoloft nightly  . Dizziness    hx of  . Emphysema   . Gastric ulcer   . GERD (gastroesophageal reflux disease)    takes Omeprazole and Ranitidine daily  . Headache(784.0)   . Heart disease   . History of kidney stones 2000  . Hyperlipidemia    doesn't take any meds at present time  . Hypertension    takes Metoprolol and Lisinopril occ  . Insomnia    xanax and pain pills help  . Liver disease   . Migraines    last on e3/10/13  . Multiple sclerosis ([place])   . Neck pain    stenosis and radiculopathy  . PONV (postoperative nausea and vomiting)   . Stroke [facility])    TIA-22 yrs ago    Past Surgical History:  Procedure Laterality Date  .  ABDOMINAL HYSTERECTOMY  2004   partial d/t fibroid tumors  . ANTERIOR CERVICAL DECOMP/DISCECTOMY FUSION  11/24/2011   Procedure: ANTERIOR CERVICAL DECOMPRESSION/DISCECTOMY FUSION 3 LEVELS;  Surgeon: Hosie Spangle, MD;  Location: [facility]UR[facility] ORS;  Service: Neurosurgery;  Laterality: N/A;  Cervical three-four, four-five,five-six anterior cervical decompression with fusion plating and bonegraft  . BREAST SURGERY  2002   breast reduction  . CERVICAL CONE BIOPSY  20+yrs ago   d/t cervical cancer  . ECTOPIC PREGNANCY SURGERY  1987/1989  . right hand  90's   cyst removed from top of hand    Family History  Problem Relation Age of Onset  . Arthritis Father   . Cancer Father   . Anesthesia problems Neg Hx   . Hypotension Neg Hx   . Malignant hyperthermia Neg Hx   . Pseudochol deficiency Neg Hx     Social History Social History  Substance Use Topics  . Smoking status: Current Every Day Smoker    Packs/day: 1.00    Years: 32.00    Types: Cigarettes  . Smokeless tobacco: Never Used  . Alcohol use No     Comment: 1.5 40oz a day    Allergies  Allergen Reactions  . Codeine Nausea And Vomiting    Current Outpatient  Prescriptions  Medication Sig Dispense Refill  . acetaminophen (TYLENOL) 325 MG tablet Take 2 tablets (650 mg total) by mouth every 6 (six) hours as needed for mild pain (or Fever >/= 101). 30 tablet 1  . albuterol (PROVENTIL HFA;VENTOLIN HFA) 108 (90 Base) MCG/ACT inhaler Inhale 2 puffs into the lungs every 6 (six) hours as needed. For shortness of breath 8 g 0  . cyclobenzaprine (FLEXERIL) 10 MG tablet Take 1 tablet (10 mg total) by mouth 2 (two) times daily as needed for muscle spasms. 20 tablet 0  . lactulose (CHRONULAC) 10 GM/15ML solution Take 15 mLs (10 g total) by mouth daily. 473 mL 0  . LORazepam (ATIVAN) 0.5 MG tablet Take 0.5 mg by mouth at bedtime. Stop Date: 09/07/16    . Multiple Vitamin (MULITIVITAMIN WITH MINERALS) TABS Take 1 tablet by mouth daily.    .  naproxen (NAPROSYN) 500 MG tablet Take 1 tablet (500 mg total) by mouth 2 (two) times daily with a meal. 60 tablet 5  . nitroGLYCERIN (NITROSTAT) 0.4 MG SL tablet Place 1 tablet (0.4 mg total) under the tongue every 5 (five) minutes as needed. For chest pain 30 tablet 0  . ranitidine (ZANTAC) 150 MG tablet Take 150 mg by mouth daily as needed for heartburn.     . sertraline (ZOLOFT) 50 MG tablet Take 1 tablet (50 mg total) by mouth daily. depression 30 tablet 0  . spironolactone (ALDACTONE) 50 MG tablet Take 1 tablet (50 mg total) by mouth daily. 30 tablet 0   No current facility-administered medications for this visit.      Physical Exam  Blood pressure 94/66, pulse (!) 107, temperature 97.3 F (36.3 C), height 5\' 1"  (1.549 m), weight 115 lb (52.2 kg).  Constitutional: overall normal hygiene, normal nutrition, well developed, normal grooming, normal body habitus. Assistive device:none  Musculoskeletal: gait and station Limp to right slightly, muscle tone and strength are normal, no tremors or atrophy is present.  .  Neurological: coordination overall normal.  Deep tendon reflex/nerve stretch intact.  Sensation normal.  Cranial nerves II-XII intact.   Skin:   Normal overall no scars, lesions, ulcers or rashes. No psoriasis.  Psychiatric: Alert and oriented x 3.  Recent memory intact, remote memory unclear.  Normal mood and affect. Well groomed.  Good eye contact.  Cardiovascular: overall no swelling, no varicosities, no edema bilaterally, normal temperatures of the legs and arms, no clubbing, cyanosis and good capillary refill.  Lymphatic: palpation is normal.  Her gait is significantly improved.  Her right shoulder is tender in the extremes but has full motion.  Muscle tone and strength are normal.  NV intact.  Her left hip is tender but has much less pain.  ROM is full.  NV intact.   The patient has been educated about the nature of the problem(s) and counseled on treatment  options.  The patient appeared to understand what I have discussed and is in agreement with it.  Encounter Diagnoses  Name Primary?  . Pain of left hip joint Yes  . Cigarette nicotine dependence without complication   . Chronic arthralgias of knees and hips   . Unsteady gait   . Chronic right shoulder pain     PLAN Call if any problems.  Precautions discussed.  Continue current medications.   Return to clinic 1 month   Continue PT.  Electronically Signed Sanjuana Kava, MD 6/28/20189:35 AM

## 2017-03-11 ENCOUNTER — Ambulatory Visit (HOSPITAL_COMMUNITY): Payer: Medicare HMO

## 2017-03-15 ENCOUNTER — Ambulatory Visit (HOSPITAL_COMMUNITY): Payer: Medicare HMO | Attending: Orthopaedic Surgery

## 2017-03-15 DIAGNOSIS — M25552 Pain in left hip: Secondary | ICD-10-CM | POA: Diagnosis present

## 2017-03-15 DIAGNOSIS — R2681 Unsteadiness on feet: Secondary | ICD-10-CM | POA: Insufficient documentation

## 2017-03-15 DIAGNOSIS — M6281 Muscle weakness (generalized): Secondary | ICD-10-CM | POA: Insufficient documentation

## 2017-03-15 DIAGNOSIS — R262 Difficulty in walking, not elsewhere classified: Secondary | ICD-10-CM | POA: Diagnosis present

## 2017-03-15 NOTE — Therapy (Signed)
[place], [place], [address] Phone: [phone]   Fax:  [phone]  Physical Therapy Treatment  Patient Details  Name: Courtney Thornton MRN: [id number] Date of Birth: 19-Sep-1965 Referring Provider: Sanjuana Kava, MD  Encounter Date: 03/15/2017      PT End of Session - 03/15/17 1652    Visit Number 9   Number of Visits 17   Date for PT Re-Evaluation 03/31/17   Authorization Type Medicare Part A and B (G Codes done on Nov 22, 2022 visit)   Authorization - Visit Number 9   Authorization - Number of Visits 17   PT Start Time 4268   PT Stop Time 1732   PT Time Calculation (min) 41 min   Equipment Utilized During Treatment Gait belt   Activity Tolerance Patient tolerated treatment well;No increased pain   Behavior During Therapy WFL for tasks assessed/performed      Past Medical History:  Diagnosis Date  . Anxiety    takes Xanax daily  . Arthritis    shoulders  . Blood transfusion 2004  . Bruises easily    platelets are always high  . Cancer (HCC)    cervical  . Depression    takes Zoloft nightly  . Dizziness    hx of  . Emphysema   . Gastric ulcer   . GERD (gastroesophageal reflux disease)    takes Omeprazole and Ranitidine daily  . Headache(784.0)   . Heart disease   . History of kidney stones 2000  . Hyperlipidemia    doesn't take any meds at present time  . Hypertension    takes Metoprolol and Lisinopril occ  . Insomnia    xanax and pain pills help  . Liver disease   . Migraines    last on e3/10/13  . Multiple sclerosis ([place])   . Neck pain    stenosis and radiculopathy  . PONV (postoperative nausea and vomiting)   . Stroke [facility])    TIA-22 yrs ago    Past Surgical History:  Procedure Laterality Date  . ABDOMINAL HYSTERECTOMY  2004   partial d/t fibroid tumors  . ANTERIOR CERVICAL DECOMP/DISCECTOMY FUSION  11/24/2011   Procedure: ANTERIOR CERVICAL DECOMPRESSION/DISCECTOMY FUSION 3  LEVELS;  Surgeon: Hosie Spangle, MD;  Location: [facility];  Service: Neurosurgery;  Laterality: N/A;  Cervical three-four, four-five,five-six anterior cervical decompression with fusion plating and bonegraft  . BREAST SURGERY  2002   breast reduction  . CERVICAL CONE BIOPSY  20+yrs ago   d/t cervical cancer  . ECTOPIC PREGNANCY SURGERY  1987/1989  . right hand  90's   cyst removed from top of hand    There were no vitals filed for this visit.      Subjective Assessment - 03/15/17 1645    Subjective Pt stated she is feeling good today, no reports of pain.  Pt arrived wiht big smile on face, reoprts she passed her driving test.  Feels her balance is making good progress.     Pertinent History MS, ETOH abuse (per medical records), cervical CA, depression, emphysema, heart disease, HTN, TIA (22 years ago)   Patient Stated Goals decrease pain   Currently in Pain? No/denies                         [facility] Adult PT Treatment/Exercise - 03/15/17 0001      Knee/Hip Exercises: Standing   Heel Raises 2 sets;10 reps  Heel Raises Limitations squat then lift ball over head with heel raise   Functional Squat 2 sets;10 reps   Functional Squat Limitations squat then lift ball over head with heel raise   Gait Training DGI activities 72min to improve confidence with different cadence, head turns, stop and go with min A             Balance Exercises - 03/15/17 1740      Balance Exercises: Standing   Tandem Stance Eyes open;Foam/compliant surface;2 reps;30 secs  with head turns   SLS with Vectors Solid surface;3 reps;10 secs  noHHA   Balance Beam 2RT tandem, sidestep and retro   Sidestepping 2 reps;Theraband  GTB             PT Short Term Goals - 03/03/17 1607      PT SHORT TERM GOAL #1   Title Pt will be independent with HEP and perform it consistently in order to maximize overall strength and balance.   Baseline 6/21 compliant with HEP   Time 2   Period  Weeks   Status Achieved     PT SHORT TERM GOAL #2   Title Pt will have improved strength to at least 5/5 throughout BLE to maximize gait and overall function.    Baseline 6/21: proximal hip musculature still weak, but pt has made improvements in almost all MMT (original goal was 4/5, assume this was typo as pt only had one 3/5 MMT, all others were 4 to 4+)   Time 2   Period Weeks   Status Revised     PT SHORT TERM GOAL #3   Title Pt will have improved 3MWT by 162ft or > with minimal to no pain in her L hip and with minimal to no gait deviations to demonstrate improved overall functional strength, balance, and gait and to promote pt's participation in a regular walking program.   Baseline --   Time 2   Period Weeks   Status New           PT Long Term Goals - 03/03/17 1609      PT LONG TERM GOAL #1   Title Pt will improve 5xSTS time to <10 seconds to demonstrate improved overall power and balance to minimize risk of falls.   Baseline 6/21: 9.3 sec from chair with no UE   Time 4   Period Weeks   Status Achieved     PT LONG TERM GOAL #2   Title Pt will be able to perform SLS on BLE for 10 seconds or greater with no UE support to maximize gait and decrease risk of falls.   Baseline 6/21: L: 7 sec or less, R: 12 sec or less but unsteady during   Time 4   Period Weeks   Status On-going     PT LONG TERM GOAL #3   Title Pt will have improved TUG to 12 seconds or < with LRAD to demonstrate improved overall gait and balance.   Baseline 6/21: 13.18 sec trial 1, 11.2 sec trial 2 without AD   Time 4   Period Weeks   Status Achieved     PT LONG TERM GOAL #4   Title Pt will have improved DGI to 22/24 to demonstrate improved overall strength and balance and to decrease risk for falls.   Time 4   Period Weeks   Status New     PT LONG TERM GOAL #5   Title Pt will have report participating  in a regular walking program and exercise program for at least 3x/week to promote recovery and  maximize participation in the community.   Time 4   Period Weeks   Status New               Plan - 03/15/17 1736    Clinical Impression Statement Continued session foucs on functional strengthenig and balance activities to improve confidence wiht gait.  Added squats then ball reach overhead with heel raises incorporating strengthening and balance with SBA for LOB episodes.  Progressed to dynamic surface with tandem, retro and sidestepping with min guard for safety.  Pt able to complete all exercises with no reports of pain, was limited by fatigue with seated rest breaks.   Rehab Potential Fair   Clinical Impairments Affecting Rehab Potential (-): comorbidities, chronicity of issue   PT Frequency 2x / week   PT Duration 4 weeks   PT Treatment/Interventions ADLs/Self Care Home Management;DME Instruction;Gait training;Stair training;Functional mobility training;Therapeutic activities;Therapeutic exercise;Balance training;Neuromuscular re-education;Patient/family education;Manual techniques;Passive range of motion   PT Next Visit Plan Next session begin step up without HHA and reaching overhead.  Continue gait training wiht DGI activities and balance activities on dynamic surface.     PT Home Exercise Plan eval: supine HS stretch, supine clamshells with GTB 6/5:  bridge, sit to stand; 6/14 tandem stance and SLS      Patient will benefit from skilled therapeutic intervention in order to improve the following deficits and impairments:  Abnormal gait, Decreased activity tolerance, Decreased balance, Decreased mobility, Decreased strength, Difficulty walking, Impaired UE functional use, Improper body mechanics, Postural dysfunction, Pain, Impaired flexibility  Visit Diagnosis: Unsteadiness on feet  Difficulty in walking, not elsewhere classified  Muscle weakness (generalized)  Pain in left hip     Problem List Patient Active Problem List   Diagnosis Date Noted  . Adult failure to  thrive 08/12/2016  . Change in bowel movement 08/12/2016  . Subacute confusional state 08/12/2016  . Leukocytosis 08/06/2016  . Hypokalemia 07/31/2016  . Hyponatremia 07/31/2016  . Alcohol abuse 07/31/2016  . Anemia 07/31/2016  . Diarrhea 07/31/2016  . Protein-calorie malnutrition, severe ([place]) 07/31/2016  . HTN (hypertension) 07/31/2016  . GERD (gastroesophageal reflux disease) 07/31/2016   Ihor Austin, [place]; [place]  Aldona Lento 03/15/2017, 5:42 PM  [facility] [address] [place], [place], [address] Phone: [phone]   Fax:  [phone]  Name: Courtney Thornton MRN: [id number] Date of Birth: 1966/08/11

## 2017-03-17 ENCOUNTER — Ambulatory Visit (HOSPITAL_COMMUNITY): Payer: Medicare HMO

## 2017-03-17 DIAGNOSIS — R262 Difficulty in walking, not elsewhere classified: Secondary | ICD-10-CM

## 2017-03-17 DIAGNOSIS — R2681 Unsteadiness on feet: Secondary | ICD-10-CM

## 2017-03-17 DIAGNOSIS — M6281 Muscle weakness (generalized): Secondary | ICD-10-CM

## 2017-03-17 DIAGNOSIS — M25552 Pain in left hip: Secondary | ICD-10-CM

## 2017-03-17 NOTE — Therapy (Signed)
[place], [place], [address] Phone: [phone]   Fax:  [phone]  Physical Therapy Treatment  Patient Details  Name: Courtney Thornton MRN: [id number] Date of Birth: [birth year] Referring Provider: Sanjuana Kava, MD  Encounter Date: 03/17/2017      PT End of Session - 03/17/17 1644    Visit Number 10   Number of Visits 17   Date for PT Re-Evaluation 03/31/17   Authorization Type Medicare Part A and B (G Codes done on December 07, 2022 visit)   Authorization - Visit Number 10   Authorization - Number of Visits 17   PT Start Time 9678   PT Stop Time 1644   PT Time Calculation (min) 38 min   Equipment Utilized During Treatment Gait belt   Activity Tolerance Patient tolerated treatment well;No increased pain   Behavior During Therapy WFL for tasks assessed/performed      Past Medical History:  Diagnosis Date  . Anxiety    takes Xanax daily  . Arthritis    shoulders  . Blood transfusion 2004  . Bruises easily    platelets are always high  . Cancer (HCC)    cervical  . Depression    takes Zoloft nightly  . Dizziness    hx of  . Emphysema   . Gastric ulcer   . GERD (gastroesophageal reflux disease)    takes Omeprazole and Ranitidine daily  . Headache(784.0)   . Heart disease   . History of kidney stones 2000  . Hyperlipidemia    doesn't take any meds at present time  . Hypertension    takes Metoprolol and Lisinopril occ  . Insomnia    xanax and pain pills help  . Liver disease   . Migraines    last on e3/10/13  . Multiple sclerosis ([place])   . Neck pain    stenosis and radiculopathy  . PONV (postoperative nausea and vomiting)   . Stroke [facility])    TIA-22 yrs ago    Past Surgical History:  Procedure Laterality Date  . ABDOMINAL HYSTERECTOMY  2004   partial d/t fibroid tumors  . ANTERIOR CERVICAL DECOMP/DISCECTOMY FUSION  11/24/2011   Procedure: ANTERIOR CERVICAL DECOMPRESSION/DISCECTOMY FUSION 3  LEVELS;  Surgeon: Hosie Spangle, MD;  Location: [place];  Service: Neurosurgery;  Laterality: N/A;  Cervical three-four, four-five,five-six anterior cervical decompression with fusion plating and bonegraft  . BREAST SURGERY  2002   breast reduction  . CERVICAL CONE BIOPSY  20+yrs ago   d/t cervical cancer  . ECTOPIC PREGNANCY SURGERY  1987/1989  . right hand  90's   cyst removed from top of hand    There were no vitals filed for this visit.      Subjective Assessment - 03/17/17 1629    Subjective Pt stated she is feeling good today, no reports of pain.  Reports compliance with new HEP for balance   Pertinent History MS, ETOH abuse (per medical records), cervical CA, depression, emphysema, heart disease, HTN, TIA (22 years ago)   Patient Stated Goals decrease pain   Currently in Pain? No/denies                         [facility] Adult PT Treatment/Exercise - 03/17/17 0001      Knee/Hip Exercises: Standing   Heel Raises 2 sets;10 reps   Heel Raises Limitations squat then lift ball over head with heel raise   Forward  Step Up Both;10 reps;Hand Hold: 0;Step Height: 6"  Lifting object over head into cabinet   Functional Squat 2 sets;10 reps   Functional Squat Limitations squat then lift ball over head with heel raise   SLS with Vectors 3x 5" on foam BLE   Gait Training DGI activities 43min to improve confidence with different cadence, head turns, stop and go with min guard             Balance Exercises - 03/17/17 1631      Balance Exercises: Standing   Tandem Stance Eyes open;Foam/compliant surface;3 reps;30 secs  head then UE movement   SLS with Vectors Solid surface;3 reps;10 secs   Balance Beam 2RT tandem, sidestep and retro             PT Short Term Goals - 03/03/17 1607      PT SHORT TERM GOAL #1   Title Pt will be independent with HEP and perform it consistently in order to maximize overall strength and balance.   Baseline 6/21 compliant  with HEP   Time 2   Period Weeks   Status Achieved     PT SHORT TERM GOAL #2   Title Pt will have improved strength to at least 5/5 throughout BLE to maximize gait and overall function.    Baseline 6/21: proximal hip musculature still weak, but pt has made improvements in almost all MMT (original goal was 4/5, assume this was typo as pt only had one 3/5 MMT, all others were 4 to 4+)   Time 2   Period Weeks   Status Revised     PT SHORT TERM GOAL #3   Title Pt will have improved 3MWT by 193ft or > with minimal to no pain in her L hip and with minimal to no gait deviations to demonstrate improved overall functional strength, balance, and gait and to promote pt's participation in a regular walking program.   Baseline --   Time 2   Period Weeks   Status New           PT Long Term Goals - 03/03/17 1609      PT LONG TERM GOAL #1   Title Pt will improve 5xSTS time to <10 seconds to demonstrate improved overall power and balance to minimize risk of falls.   Baseline 6/21: 9.3 sec from chair with no UE   Time 4   Period Weeks   Status Achieved     PT LONG TERM GOAL #2   Title Pt will be able to perform SLS on BLE for 10 seconds or greater with no UE support to maximize gait and decrease risk of falls.   Baseline 6/21: L: 7 sec or less, R: 12 sec or less but unsteady during   Time 4   Period Weeks   Status On-going     PT LONG TERM GOAL #3   Title Pt will have improved TUG to 12 seconds or < with LRAD to demonstrate improved overall gait and balance.   Baseline 6/21: 13.18 sec trial 1, 11.2 sec trial 2 without AD   Time 4   Period Weeks   Status Achieved     PT LONG TERM GOAL #4   Title Pt will have improved DGI to 22/24 to demonstrate improved overall strength and balance and to decrease risk for falls.   Time 4   Period Weeks   Status New     PT LONG TERM GOAL #5   Title  Pt will have report participating in a regular walking program and exercise program for at least  3x/week to promote recovery and maximize participation in the community.   Time 4   Period Weeks   Status New               Plan - 03/17/17 1848    Clinical Impression Statement Session foucs on incorporating functional activities with balance to improve confidence with daily tasks.  Added step ups with objects into cabinet to increase ease and reduce fear with overhead lifting activities at home.  Min guard/A for all activities for safety.  EOS pt limited by fatigue with tasks.   Rehab Potential Fair   Clinical Impairments Affecting Rehab Potential (-): comorbidities, chronicity of issue   PT Frequency 2x / week   PT Duration 4 weeks   PT Treatment/Interventions ADLs/Self Care Home Management;DME Instruction;Gait training;Stair training;Functional mobility training;Therapeutic activities;Therapeutic exercise;Balance training;Neuromuscular re-education;Patient/family education;Manual techniques;Passive range of motion   PT Next Visit Plan Next session continue with functional strengthening and balance activities.  Resume lunges, step ups and dynamic balance activities with no HHA.   PT Home Exercise Plan eval: supine HS stretch, supine clamshells with GTB 6/5:  bridge, sit to stand; 6/14 tandem stance and SLS      Patient will benefit from skilled therapeutic intervention in order to improve the following deficits and impairments:  Abnormal gait, Decreased activity tolerance, Decreased balance, Decreased mobility, Decreased strength, Difficulty walking, Impaired UE functional use, Improper body mechanics, Postural dysfunction, Pain, Impaired flexibility  Visit Diagnosis: Unsteadiness on feet  Difficulty in walking, not elsewhere classified  Muscle weakness (generalized)  Pain in left hip     Problem List Patient Active Problem List   Diagnosis Date Noted  . Adult failure to thrive 08/12/2016  . Change in bowel movement 08/12/2016  . Subacute confusional state 08/12/2016   . Leukocytosis 08/06/2016  . Hypokalemia 07/31/2016  . Hyponatremia 07/31/2016  . Alcohol abuse 07/31/2016  . Anemia 07/31/2016  . Diarrhea 07/31/2016  . Protein-calorie malnutrition, severe ([place]) 07/31/2016  . HTN (hypertension) 07/31/2016  . GERD (gastroesophageal reflux disease) 07/31/2016   Ihor Austin, [place]; [place]  Aldona Lento 03/17/2017, 6:53 PM  [facility] [address] [place], [place], [address] Phone: [phone]   Fax:  [phone]  Name: Courtney Thornton MRN: [id number] Date of Birth: 02-08-1966

## 2017-03-22 ENCOUNTER — Ambulatory Visit (HOSPITAL_COMMUNITY): Payer: Medicare HMO

## 2017-03-22 DIAGNOSIS — R2681 Unsteadiness on feet: Secondary | ICD-10-CM | POA: Diagnosis not present

## 2017-03-22 DIAGNOSIS — M6281 Muscle weakness (generalized): Secondary | ICD-10-CM

## 2017-03-22 DIAGNOSIS — M25552 Pain in left hip: Secondary | ICD-10-CM

## 2017-03-22 DIAGNOSIS — R262 Difficulty in walking, not elsewhere classified: Secondary | ICD-10-CM

## 2017-03-22 NOTE — Therapy (Signed)
[place], [place], [address] Phone: [phone]   Fax:  [phone]  Physical Therapy Treatment  Patient Details  Name: Courtney Thornton MRN: [id number] Date of Birth: 01-23-1966 Referring Provider: Sanjuana Kava, MD  Encounter Date: 03/22/2017      PT End of Session - 03/22/17 1712    Visit Number 11   Number of Visits 17   Date for PT Re-Evaluation 03/31/17   Authorization Type Medicare Part A and B (G Codes done on 11/29/2022 visit)   Authorization - Visit Number 11   Authorization - Number of Visits 17   PT Start Time 4268  pt late for apt   PT Stop Time 1734   PT Time Calculation (min) 40 min   Equipment Utilized During Treatment Gait belt   Activity Tolerance Patient tolerated treatment well;No increased pain   Behavior During Therapy WFL for tasks assessed/performed      Past Medical History:  Diagnosis Date  . Anxiety    takes Xanax daily  . Arthritis    shoulders  . Blood transfusion 2004  . Bruises easily    platelets are always high  . Cancer (HCC)    cervical  . Depression    takes Zoloft nightly  . Dizziness    hx of  . Emphysema   . Gastric ulcer   . GERD (gastroesophageal reflux disease)    takes Omeprazole and Ranitidine daily  . Headache(784.0)   . Heart disease   . History of kidney stones 2000  . Hyperlipidemia    doesn't take any meds at present time  . Hypertension    takes Metoprolol and Lisinopril occ  . Insomnia    xanax and pain pills help  . Liver disease   . Migraines    last on e3/10/13  . Multiple sclerosis ([place])   . Neck pain    stenosis and radiculopathy  . PONV (postoperative nausea and vomiting)   . Stroke [facility])    TIA-22 yrs ago    Past Surgical History:  Procedure Laterality Date  . ABDOMINAL HYSTERECTOMY  2004   partial d/t fibroid tumors  . ANTERIOR CERVICAL DECOMP/DISCECTOMY FUSION  11/24/2011   Procedure: ANTERIOR CERVICAL  DECOMPRESSION/DISCECTOMY FUSION 3 LEVELS;  Surgeon: Hosie Spangle, MD;  Location: [facility];  Service: Neurosurgery;  Laterality: N/A;  Cervical three-four, four-five,five-six anterior cervical decompression with fusion plating and bonegraft  . BREAST SURGERY  2002   breast reduction  . CERVICAL CONE BIOPSY  20+yrs ago   d/t cervical cancer  . ECTOPIC PREGNANCY SURGERY  1987/1989  . right hand  90's   cyst removed from top of hand    There were no vitals filed for this visit.      Subjective Assessment - 03/22/17 1711    Subjective Pt stated she is feeling good, continues to have difficulty with balance though reports more confidence with balance activites.   Pertinent History MS, ETOH abuse (per medical records), cervical CA, depression, emphysema, heart disease, HTN, TIA (22 years ago)   Patient Stated Goals decrease pain   Currently in Pain? No/denies               [facility] Adult PT Treatment/Exercise - 03/22/17 0001      Knee/Hip Exercises: Standing   Heel Raises 2 sets;10 reps   Heel Raises Limitations squat then lift ball over head with heel raise   Functional Squat 2 sets;10 reps  Functional Squat Limitations squat then lift ball over head with heel raise  1 set proper lifting 5#   Gait Training DGI activities 36min to improve confidence with different cadence, head turns, stop and go with min guard             Balance Exercises - 03/22/17 1727      Balance Exercises: Standing   Tandem Stance Eyes open;Foam/compliant surface;3 reps;30 secs  10 reps with head turns and UE flexion 3 sets   SLS Eyes open;5 reps  Lt 7", Rt 9" max of 5   SLS with Vectors Solid surface;3 reps  vector stance 5" holds BLE 1 finger intermittent HHA   Balance Beam 2RT tandem, sidestep and retro   Sidestepping 2 reps;Theraband   Heel Raises Limitations 2Rt heel and toe walking             PT Short Term Goals - 03/03/17 1607      PT SHORT TERM GOAL #1   Title Pt will  be independent with HEP and perform it consistently in order to maximize overall strength and balance.   Baseline 6/21 compliant with HEP   Time 2   Period Weeks   Status Achieved     PT SHORT TERM GOAL #2   Title Pt will have improved strength to at least 5/5 throughout BLE to maximize gait and overall function.    Baseline 6/21: proximal hip musculature still weak, but pt has made improvements in almost all MMT (original goal was 4/5, assume this was typo as pt only had one 3/5 MMT, all others were 4 to 4+)   Time 2   Period Weeks   Status Revised     PT SHORT TERM GOAL #3   Title Pt will have improved 3MWT by 155ft or > with minimal to no pain in her L hip and with minimal to no gait deviations to demonstrate improved overall functional strength, balance, and gait and to promote pt's participation in a regular walking program.   Baseline --   Time 2   Period Weeks   Status New           PT Long Term Goals - 03/03/17 1609      PT LONG TERM GOAL #1   Title Pt will improve 5xSTS time to <10 seconds to demonstrate improved overall power and balance to minimize risk of falls.   Baseline 6/21: 9.3 sec from chair with no UE   Time 4   Period Weeks   Status Achieved     PT LONG TERM GOAL #2   Title Pt will be able to perform SLS on BLE for 10 seconds or greater with no UE support to maximize gait and decrease risk of falls.   Baseline 6/21: L: 7 sec or less, R: 12 sec or less but unsteady during   Time 4   Period Weeks   Status On-going     PT LONG TERM GOAL #3   Title Pt will have improved TUG to 12 seconds or < with LRAD to demonstrate improved overall gait and balance.   Baseline 6/21: 13.18 sec trial 1, 11.2 sec trial 2 without AD   Time 4   Period Weeks   Status Achieved     PT LONG TERM GOAL #4   Title Pt will have improved DGI to 22/24 to demonstrate improved overall strength and balance and to decrease risk for falls.   Time 4   Period Weeks  Status New      PT LONG TERM GOAL #5   Title Pt will have report participating in a regular walking program and exercise program for at least 3x/week to promote recovery and maximize participation in the community.   Time 4   Period Weeks   Status New               Plan - 03/22/17 1734    Clinical Impression Statement Continued session focus incorporating functional activities and dynamic balance activities.  Added heel and toe walking for balance and ankle strengthening.  Pt continues to have difficulty with single leg stance activities requiring HHA or min guard with activities.  No reoprts of pain through session.     Rehab Potential Fair   Clinical Impairments Affecting Rehab Potential (-): comorbidities, chronicity of issue   PT Frequency 2x / week   PT Duration 4 weeks   PT Treatment/Interventions ADLs/Self Care Home Management;DME Instruction;Gait training;Stair training;Functional mobility training;Therapeutic activities;Therapeutic exercise;Balance training;Neuromuscular re-education;Patient/family education;Manual techniques;Passive range of motion   PT Next Visit Plan Next session continue with functional strengthening and balance activities.  Resume lunges, step ups and dynamic balance activities with no HHA.      Patient will benefit from skilled therapeutic intervention in order to improve the following deficits and impairments:  Abnormal gait, Decreased activity tolerance, Decreased balance, Decreased mobility, Decreased strength, Difficulty walking, Impaired UE functional use, Improper body mechanics, Postural dysfunction, Pain, Impaired flexibility  Visit Diagnosis: Unsteadiness on feet  Difficulty in walking, not elsewhere classified  Muscle weakness (generalized)  Pain in left hip     Problem List Patient Active Problem List   Diagnosis Date Noted  . Adult failure to thrive 08/12/2016  . Change in bowel movement 08/12/2016  . Subacute confusional state 08/12/2016  .  Leukocytosis 08/06/2016  . Hypokalemia 07/31/2016  . Hyponatremia 07/31/2016  . Alcohol abuse 07/31/2016  . Anemia 07/31/2016  . Diarrhea 07/31/2016  . Protein-calorie malnutrition, severe ([place]) 07/31/2016  . HTN (hypertension) 07/31/2016  . GERD (gastroesophageal reflux disease) 07/31/2016   Ihor Austin, [place]; [place]  Aldona Lento 03/22/2017, 5:44 PM  [place] [address] [place], [place], [address] Phone: [phone]   Fax:  [phone]  Name: Courtney Thornton MRN: [id number] Date of Birth: 08-13-1966

## 2017-03-23 ENCOUNTER — Telehealth: Payer: Self-pay

## 2017-03-23 ENCOUNTER — Ambulatory Visit (INDEPENDENT_AMBULATORY_CARE_PROVIDER_SITE_OTHER): Payer: Medicare HMO | Admitting: Gastroenterology

## 2017-03-23 ENCOUNTER — Telehealth: Payer: Self-pay | Admitting: Gastroenterology

## 2017-03-23 ENCOUNTER — Other Ambulatory Visit: Payer: Self-pay

## 2017-03-23 ENCOUNTER — Encounter: Payer: Self-pay | Admitting: Gastroenterology

## 2017-03-23 VITALS — BP 95/61 | HR 93 | Temp 97.2°F | Ht 60.0 in | Wt 114.8 lb

## 2017-03-23 DIAGNOSIS — K7030 Alcoholic cirrhosis of liver without ascites: Secondary | ICD-10-CM

## 2017-03-23 DIAGNOSIS — R1310 Dysphagia, unspecified: Secondary | ICD-10-CM | POA: Insufficient documentation

## 2017-03-23 DIAGNOSIS — K769 Liver disease, unspecified: Secondary | ICD-10-CM | POA: Diagnosis not present

## 2017-03-23 DIAGNOSIS — K625 Hemorrhage of anus and rectum: Secondary | ICD-10-CM | POA: Insufficient documentation

## 2017-03-23 DIAGNOSIS — K219 Gastro-esophageal reflux disease without esophagitis: Secondary | ICD-10-CM

## 2017-03-23 DIAGNOSIS — K7460 Unspecified cirrhosis of liver: Secondary | ICD-10-CM | POA: Insufficient documentation

## 2017-03-23 MED ORDER — PANTOPRAZOLE SODIUM 40 MG PO TBEC: 40.0000 mg | DELAYED_RELEASE_TABLET | Freq: Every day | ORAL | 3 refills | Status: DC

## 2017-03-23 MED ORDER — PEG 3350-KCL-NA BICARB-NACL 420 G PO SOLR: 4000.0000 mL | ORAL | 0 refills | Status: DC

## 2017-03-23 NOTE — Telephone Encounter (Signed)
Tried to call pt to inform of pre-op appt 04/22/17 at 10:00am, no answer. Letter mailed.

## 2017-03-23 NOTE — Telephone Encounter (Signed)
I just printed out labs that patient needs to complete. The labs from PCP were from several months ago, not any more recent than what I have in epic. We need to update these so I can calculate MELD.

## 2017-03-23 NOTE — Assessment & Plan Note (Signed)
One episode several months ago, likely benign anorectal source but no prior colonoscopy.   Proceed with TCS with Dr. Gala Romney in near future: the risks, benefits, and alternatives have been discussed with the patient in detail. The patient states understanding and desires to proceed. PROPOFOL due to polypharmacy

## 2017-03-23 NOTE — Assessment & Plan Note (Signed)
MRI of liver for further evaluation. Check AFP as well.

## 2017-03-23 NOTE — Patient Instructions (Addendum)
We have scheduled you for a colonoscopy and upper endoscopy with dilation by Dr. Gala Romney in the near future.  I have ordered an MRI for further evaluation of your liver.   Start taking Protonix once each day, 30 minutes before breakfast instead of ranitidine (Zantac).   Please complete the Hepatitis vaccinations with your doctor.   I will see you in 3 months!  Great job on quitting alcohol! This is wonderful news, and keep up the good work.   Cirrhosis Cirrhosis is long-term (chronic) liver injury. The liver is your largest internal organ, and it performs many functions. The liver converts food into energy, removes toxic material from your blood, makes important proteins, and absorbs necessary vitamins from your diet. If you have cirrhosis, it means many of your healthy liver cells have been replaced by scar tissue. This prevents blood from flowing through your liver, which makes it difficult for your liver to function. This scarring is not reversible, but treatment can prevent it from getting worse. What are the causes? Hepatitis C and long-term alcohol abuse are the most common causes of cirrhosis. Other causes include:  Nonalcoholic fatty liver disease.  Hepatitis B infection.  Autoimmune hepatitis.  Diseases that cause blockage of ducts inside the liver.  Inherited liver diseases.  Reactions to certain long-term medicines.  Parasitic infections.  Long-term exposure to certain toxins.  What increases the risk? You may have a higher risk of cirrhosis if you:  Have certain hepatitis viruses.  Abuse alcohol, especially if you are female.  Are overweight.  Share needles.  Have unprotected sex with someone who has hepatitis.  What are the signs or symptoms? You may not have any signs and symptoms at first. Symptoms may not develop until the damage to your liver starts to get worse. Signs and symptoms of cirrhosis may include:  Tenderness in the right-upper part of your  abdomen.  Weakness and tiredness (fatigue).  Loss of appetite.  Nausea.  Weight loss and muscle loss.  Itchiness.  Yellow skin and eyes (jaundice).  Buildup of fluid in the abdomen (ascites).  Swelling of the feet and ankles (edema).  Appearance of tiny blood vessels under the skin.  Mental confusion.  Easy bruising and bleeding.  How is this diagnosed? Your health care provider may suspect cirrhosis based on your symptoms and medical history, especially if you have other medical conditions or a history of alcohol abuse. Your health care provider will do a physical exam to feel your liver and check for signs of cirrhosis. Your health care provider may perform other tests, including:  Blood tests to check: ? Whether you have hepatitis B or C. ? Kidney function. ? Liver function.  Imaging tests such as: ? MRI or CT scan to look for changes seen in advanced cirrhosis. ? Ultrasound to see if normal liver tissue is being replaced by scar tissue.  A procedure using a long needle to take a sample of liver tissue (biopsy) for examination under a microscope. Liver biopsy can confirm the diagnosis of cirrhosis.  How is this treated? Treatment depends on how damaged your liver is and what caused the damage. Treatment may include treating cirrhosis symptoms or treating the underlying causes of the condition to try to slow the progression of the damage. Treatment may include:  Making lifestyle changes, such as: ? Eating a healthy diet. ? Restricting salt intake. ? Maintaining a healthy weight. ? Not abusing drugs or alcohol.  Taking medicines to: ? Treat liver infections  or other infections. ? Control itching. ? Reduce fluid buildup. ? Reduce certain blood toxins. ? Reduce risk of bleeding from enlarged blood vessels in the stomach or esophagus (varices).  If varices are causing bleeding problems, you may need treatment with a procedure that ties up the vessels causing them  to fall off (band ligation).  If cirrhosis is causing your liver to fail, your health care provider may recommend a liver transplant.  Other treatments may be recommended depending on any complications of cirrhosis, such as liver-related kidney failure (hepatorenal syndrome).  Follow these instructions at home:  Take medicines only as directed by your health care provider. Do not use drugs that are toxic to your liver. Ask your health care provider before taking any new medicines, including over-the-counter medicines.  Rest as needed.  Eat a well-balanced diet. Ask your health care provider or dietitian for more information.  You may have to follow a low-salt diet or restrict your water intake as directed.  Do not drink alcohol. This is especially important if you are taking acetaminophen.  Keep all follow-up visits as directed by your health care provider. This is important. Contact a health care provider if:  You have fatigue or weakness that is getting worse.  You develop swelling of the hands, feet, legs, or face.  You have a fever.  You develop loss of appetite.  You have nausea or vomiting.  You develop jaundice.  You develop easy bruising or bleeding. Get help right away if:  You vomit bright red blood or a material that looks like coffee grounds.  You have blood in your stools.  Your stools appear black and tarry.  You become confused.  You have chest pain or trouble breathing. This information is not intended to replace advice given to you by your health care provider. Make sure you discuss any questions you have with your health care provider. Document Released: 08/30/2005 Document Revised: 01/08/2016 Document Reviewed: 05/08/2014 Elsevier Interactive Patient Education  [employer].

## 2017-03-23 NOTE — Progress Notes (Signed)
Primary Care Physician:  Vidal Schwalbe, MD Primary Gastroenterologist:  Dr. Gala Romney   Chief Complaint  Patient presents with  . Rectal Bleeding    HPI:   Courtney Thornton is a 51 y.o. female presenting today at the request of her PCP to arrange a diagnostic colonoscopy due to rectal bleeding. However, upon review of records, it appears she has likely cirrhosis with last imaging in Nov 2017 with enlarged heterogenous low-attenuation liver compatible with fatty infiltration or early cirrhotic changes, possible 1.8 cm rim enhancing mass or nodule within the posterior right hepatic lobe. Non-specific colon wall thickening involving ascending colon and hepatic flexure noted Nov 2017 on CT. From what I can see, she has not had an MRI to follow-up on this possible mass or nodule. Her LFTs have been historically elevated in a pattern consistent with ETOH use, with elevated AST. Alk Phos also elevated chronically. Acute hepatitis panel negative in Nov 2017. Negative HIV. No prior colonoscopy or endoscopy.   She states she noted one episode of rectal bleeding about 2 months ago. Painless. None since then. States her baseline is [place] stool scale #6. Has a BM 2-3 times per day. Very seldom has a formed stool. Has occasional sharp twinges in abdomen. Taking lactulose 15 ml daily due to some concern for encephalopathy during a past admission upon review of records. States she used to drink ETOH chronically. Every 24 hours would drink 3-4 40s a day. No ETOH since September of 2017.   Has chronic GERD. Notes solid food dysphagia, going on for "years". Takes Zantac with improvement.    Was extremely deconditioned September 2017 with significant weight loss, unable to walk, unable to do ADLs. She was in rehab and doing much better now, independently functioning. Notes history of MS.   Most recent labs are in epic from May 2018. Prior to this, labs were done from PCP to include CBC and CMP, which are  similar to May 2018 labs.   Past Medical History:  Diagnosis Date  . Anxiety    takes Xanax daily  . Arthritis    shoulders  . Blood transfusion 2004  . Bruises easily    platelets are always high  . Cancer (HCC)    cervical  . Depression    takes Zoloft nightly  . Dizziness    hx of  . Emphysema   . Gastric ulcer   . GERD (gastroesophageal reflux disease)    takes Omeprazole and Ranitidine daily  . Headache(784.0)   . Heart disease   . History of kidney stones 2000  . Hyperlipidemia    doesn't take any meds at present time  . Hypertension    takes Metoprolol and Lisinopril occ  . Insomnia    xanax and pain pills help  . Liver disease   . Migraines    last on e3/10/13  . Multiple sclerosis ([place])   . Neck pain    stenosis and radiculopathy  . PONV (postoperative nausea and vomiting)   . Stroke [facility])    TIA-22 yrs ago    Past Surgical History:  Procedure Laterality Date  . ABDOMINAL HYSTERECTOMY  2004   partial d/t fibroid tumors  . ANTERIOR CERVICAL DECOMP/DISCECTOMY FUSION  11/24/2011   Procedure: ANTERIOR CERVICAL DECOMPRESSION/DISCECTOMY FUSION 3 LEVELS;  Surgeon: Hosie Spangle, MD;  Location: [facility];  Service: Neurosurgery;  Laterality: N/A;  Cervical three-four, four-five,five-six anterior cervical decompression with fusion plating and bonegraft  . BREAST SURGERY  2002   breast reduction  . CERVICAL CONE BIOPSY  20+yrs ago   d/t cervical cancer  . ECTOPIC PREGNANCY SURGERY  1987/1989  . right hand  90's   cyst removed from top of hand    Current Outpatient Prescriptions  Medication Sig Dispense Refill  . albuterol (PROVENTIL HFA;VENTOLIN HFA) 108 (90 Base) MCG/ACT inhaler Inhale 2 puffs into the lungs every 6 (six) hours as needed. For shortness of breath 8 g 0  . cyclobenzaprine (FLEXERIL) 10 MG tablet Take 1 tablet (10 mg total) by mouth 2 (two) times daily as needed for muscle spasms. 20 tablet 0  . lactulose (CHRONULAC) 10 GM/15ML solution  Take 15 mLs (10 g total) by mouth daily. 473 mL 0  . LORazepam (ATIVAN) 0.5 MG tablet Take 0.5 mg by mouth at bedtime. Stop Date: 09/07/16    . Multiple Vitamin (MULITIVITAMIN WITH MINERALS) TABS Take 1 tablet by mouth daily.    . nitroGLYCERIN (NITROSTAT) 0.4 MG SL tablet Place 1 tablet (0.4 mg total) under the tongue every 5 (five) minutes as needed. For chest pain 30 tablet 0  . ranitidine (ZANTAC) 150 MG tablet Take 150 mg by mouth daily as needed for heartburn.     . spironolactone (ALDACTONE) 50 MG tablet Take 1 tablet (50 mg total) by mouth daily. 30 tablet 0  . acetaminophen (TYLENOL) 325 MG tablet Take 2 tablets (650 mg total) by mouth every 6 (six) hours as needed for mild pain (or Fever >/= 101). 30 tablet 1  . naproxen (NAPROSYN) 500 MG tablet Take 1 tablet (500 mg total) by mouth 2 (two) times daily with a meal. (Patient not taking: Reported on 03/23/2017) 60 tablet 5  . sertraline (ZOLOFT) 50 MG tablet Take 1 tablet (50 mg total) by mouth daily. depression (Patient not taking: Reported on 03/23/2017) 30 tablet 0   No current facility-administered medications for this visit.     Allergies as of 03/23/2017 - Review Complete 03/23/2017  Allergen Reaction Noted  . Codeine Nausea And Vomiting 11/19/2011    Family History  Problem Relation Age of Onset  . Arthritis Father   . Cancer Father   . Anesthesia problems Neg Hx   . Hypotension Neg Hx   . Malignant hyperthermia Neg Hx   . Pseudochol deficiency Neg Hx     Social History   Social History  . Marital status: Married    Spouse name: N/A  . Number of children: N/A  . Years of education: N/A   Occupational History  . Not on file.   Social History Main Topics  . Smoking status: Current Every Day Smoker    Packs/day: 1.00    Years: 32.00    Types: Cigarettes  . Smokeless tobacco: Never Used  . Alcohol use No     Comment: 1.5 40oz a day  . Drug use: Yes    Types: Marijuana, Cocaine     Comment: pt denies, but was  in hx  . Sexual activity: Yes    Birth control/ protection: Surgical   Other Topics Concern  . Not on file   Social History Narrative  . No narrative on file    Review of Systems: Gen: Denies any fever, chills, fatigue, weight loss, lack of appetite.  CV: Denies chest pain, heart palpitations, peripheral edema, syncope.  Resp: Denies shortness of breath at rest or with exertion. Denies wheezing or cough.  GI: see HPI  GU : Denies urinary burning, urinary frequency, urinary hesitancy  MS: uses walker for long-distances  Derm: Denies rash, itching, dry skin Psych: Denies depression, anxiety, memory loss, and confusion Heme: see HPI   Physical Exam: BP 95/61   Pulse 93   Temp (!) 97.2 F (36.2 C) (Oral)   Ht 5' (1.524 m)   Wt 114 lb 12.8 oz (52.1 kg)   BMI 22.42 kg/m  General:   Alert and oriented. Pleasant and cooperative. Well-nourished and well-developed.  Head:  Normocephalic and atraumatic. Eyes:  Without icterus, sclera clear and conjunctiva pink.  Ears:  Normal auditory acuity. Nose:  No deformity, discharge,  or lesions. Mouth:  No deformity or lesions, oral mucosa pink.  Lungs:  Clear to auscultation bilaterally. No wheezes, rales, or rhonchi. No distress.  Heart:  S1, S2 present without murmurs appreciated.  Abdomen:  +BS, soft, non-tender and non-distended. No HSM noted. No guarding or rebound. No masses appreciated.  Rectal:  Deferred  Msk:  Symmetrical without gross deformities. Normal posture. Extremities:  Without edema. Neurologic:  Alert and  oriented x4 Psych:  Alert and cooperative. Normal mood and affect.  Lab Results  Component Value Date   ALT 16 01/15/2017   AST 21 01/15/2017   ALKPHOS 66 01/15/2017   BILITOT 0.3 01/15/2017   Lab Results  Component Value Date   WBC 10.4 01/15/2017   HGB 11.8 (L) 01/15/2017   HCT 36.0 01/15/2017   MCV 88.7 01/15/2017   PLT 382 01/15/2017   Lab Results  Component Value Date   CREATININE 0.45 01/15/2017     BUN 10 01/15/2017   NA 140 01/15/2017   K 3.6 01/15/2017   CL 106 01/15/2017   CO2 26 01/15/2017

## 2017-03-23 NOTE — Assessment & Plan Note (Signed)
51 year old female with history of alcohol abuse and sober since Sept 2017, with radiological findings of likely cirrhosis. I note that her last imaging was in Nov 2017 via CT with findings of possible mass or nodule within the right hepatic lobe. She had negative viral markers on file in Nov 2017. Most recent LFTs are normal. Well-compensated at this point but needs further evaluation due to imaging findings. Will arrange EGD for variceal screening as well; she has never had any endoscopic evaluation. Notably has chronic dysphagia, likely due to uncontrolled GERD but unable to exclude web, ring, or stricture. See "GERD" and "dysphagia" plan.   Proceed with upper endoscopy +/- dilation in the near future with Dr. Gala Romney. The risks, benefits, and alternatives have been discussed in detail with patient. They have stated understanding and desire to proceed.  PROPOFOL due to polypharmacy Start Protonix once each morning Will need to update CBC, CMP, INR, check AFP, add iron, ferritin, TIBC for now. Will hold on further serologies unless fluctuating LFTs.  MRI with/without contrast for liver lesion Hep A and B vaccinations given to patient to obtain from PCP Return in 3 months for close follow-up to wrap up loose ends

## 2017-03-23 NOTE — Telephone Encounter (Signed)
Called [employer] for prior authorization of MRI Liver. Case approved. PA# [id number], 03/30/17-04/29/17.

## 2017-03-23 NOTE — Assessment & Plan Note (Signed)
Currently only taking H2 blocker. Will start Protonix once daily. Chronic dysphagia noted. Likely related to uncontrolled GERD, unable to exclude web, ring, stricture, motility disorder, less likely malignancy. EGD/dilation as discussed.

## 2017-03-23 NOTE — Progress Notes (Signed)
CC'D TO PCP °

## 2017-03-24 NOTE — Telephone Encounter (Signed)
I have mailed the lab orders to the pt.

## 2017-03-25 ENCOUNTER — Ambulatory Visit (HOSPITAL_COMMUNITY): Payer: Medicare HMO

## 2017-03-25 ENCOUNTER — Telehealth (HOSPITAL_COMMUNITY): Payer: Self-pay | Admitting: Emergency Medicine

## 2017-03-25 NOTE — Telephone Encounter (Signed)
03/25/17 pt called and cx just said that she wouldn't be able to make appt today

## 2017-03-29 ENCOUNTER — Telehealth (HOSPITAL_COMMUNITY): Payer: Self-pay | Admitting: Emergency Medicine

## 2017-03-29 ENCOUNTER — Ambulatory Visit (HOSPITAL_COMMUNITY): Payer: Medicare HMO

## 2017-03-29 NOTE — Telephone Encounter (Signed)
03/29/17  left a message asking that all remaining appointments be cancelled for personal reasons but she said it had nothing to do with [place]

## 2017-03-30 ENCOUNTER — Ambulatory Visit (HOSPITAL_COMMUNITY)
Admission: RE | Admit: 2017-03-30 | Discharge: 2017-03-30 | Disposition: A | Discharge status: Home / Self Care | Payer: Medicare HMO | Source: Ambulatory Visit | Attending: Gastroenterology | Admitting: Gastroenterology

## 2017-03-30 DIAGNOSIS — K769 Liver disease, unspecified: Secondary | ICD-10-CM | POA: Insufficient documentation

## 2017-03-30 MED ORDER — GADOBENATE DIMEGLUMINE 529 MG/ML IV SOLN
10.0000 mL | Freq: Once | INTRAVENOUS | Status: AC | PRN
  Administered 2017-03-30: 10 mL via INTRAVENOUS

## 2017-03-31 ENCOUNTER — Encounter: Payer: Self-pay | Admitting: Orthopaedic Surgery

## 2017-03-31 ENCOUNTER — Ambulatory Visit (HOSPITAL_COMMUNITY): Payer: Medicare HMO

## 2017-03-31 ENCOUNTER — Ambulatory Visit (INDEPENDENT_AMBULATORY_CARE_PROVIDER_SITE_OTHER): Payer: Medicare HMO | Admitting: Orthopaedic Surgery

## 2017-03-31 VITALS — BP 104/72 | HR 98 | Ht 60.0 in | Wt 117.0 lb

## 2017-03-31 DIAGNOSIS — M25511 Pain in right shoulder: Secondary | ICD-10-CM | POA: Diagnosis not present

## 2017-03-31 DIAGNOSIS — F17210 Nicotine dependence, cigarettes, uncomplicated: Secondary | ICD-10-CM

## 2017-03-31 DIAGNOSIS — G8929 Other chronic pain: Secondary | ICD-10-CM

## 2017-03-31 NOTE — Patient Instructions (Signed)
Steps to Quit Smoking Smoking tobacco can be bad for your health. It can also affect almost every organ in your body. Smoking puts you and people around you at risk for many serious long-lasting (chronic) diseases. Quitting smoking is hard, but it is one of the best things that you can do for your health. It is never too late to quit. What are the benefits of quitting smoking? When you quit smoking, you lower your risk for getting serious diseases and conditions. They can include:  Lung cancer or lung disease.  Heart disease.  Stroke.  Heart attack.  Not being able to have children (infertility).  Weak bones (osteoporosis) and broken bones (fractures).  If you have coughing, wheezing, and shortness of breath, those symptoms may get better when you quit. You may also get sick less often. If you are pregnant, quitting smoking can help to lower your chances of having a baby of low birth weight. What can I do to help me quit smoking? Talk with your doctor about what can help you quit smoking. Some things you can do (strategies) include:  Quitting smoking totally, instead of slowly cutting back how much you smoke over a period of time.  Going to in-person counseling. You are more likely to quit if you go to many counseling sessions.  Using resources and support systems, such as: ? Online chats with a counselor. ? Phone quitlines. ? Printed self-help materials. ? Support groups or group counseling. ? Text messaging programs. ? Mobile phone apps or applications.  Taking medicines. Some of these medicines may have nicotine in them. If you are pregnant or breastfeeding, do not take any medicines to quit smoking unless your doctor says it is okay. Talk with your doctor about counseling or other things that can help you.  Talk with your doctor about using more than one strategy at the same time, such as taking medicines while you are also going to in-person counseling. This can help make  quitting easier. What things can I do to make it easier to quit? Quitting smoking might feel very hard at first, but there is a lot that you can do to make it easier. Take these steps:  Talk to your family and friends. Ask them to support and encourage you.  Call phone quitlines, reach out to support groups, or work with a counselor.  Ask people who smoke to not smoke around you.  Avoid places that make you want (trigger) to smoke, such as: ? Bars. ? Parties. ? Smoke-break areas at work.  Spend time with people who do not smoke.  Lower the stress in your life. Stress can make you want to smoke. Try these things to help your stress: ? Getting regular exercise. ? Deep-breathing exercises. ? Yoga. ? Meditating. ? Doing a body scan. To do this, close your eyes, focus on one area of your body at a time from head to toe, and notice which parts of your body are tense. Try to relax the muscles in those areas.  Download or buy apps on your mobile phone or tablet that can help you stick to your quit plan. There are many free apps, such as QuitGuide from the CDC (Centers for Disease Control and Prevention). You can find more support from smokefree.gov and other websites.  This information is not intended to replace advice given to you by your health care provider. Make sure you discuss any questions you have with your health care provider. Document Released: 06/26/2009 Document   Revised: 04/27/2016 Document Reviewed: 01/14/2015 Elsevier Interactive Patient Education  2018 Elsevier Inc.  

## 2017-03-31 NOTE — Progress Notes (Signed)
Patient Courtney Thornton, female DOB:01/06/1966, 51 y.o. HMC:[id number]  Chief Complaint  Patient presents with  . Follow-up    LEFT HIP, RIGHT SHOULDER    HPI  Courtney Thornton is a 51 y.o. female who has chronic pain of the right shoulder that is not getting better.  She has no new trauma,no redness.  She has been to PT and her hip is much improved.  I am not listing that as a problem now.  She continues to have pain of the right shoulder, pain in raising her hand over her head, pain at night.  I would like to get a MRI to rule out rotator cuff tear.  She has failed conservative treatment to date. HPI  Body mass index is 22.85 kg/m.  ROS  Review of Systems  HENT: Negative for congestion.   Respiratory: Positive for shortness of breath. Negative for cough.   Cardiovascular: Negative for chest pain and leg swelling.  Endocrine: Positive for cold intolerance.  Musculoskeletal: Positive for neck pain. Negative for arthralgias and gait problem.  Allergic/Immunologic: Positive for environmental allergies.  Neurological: Positive for headaches.  Psychiatric/Behavioral: The patient is nervous/anxious.     Past Medical History:  Diagnosis Date  . Anxiety    takes Xanax daily  . Arthritis    shoulders  . Blood transfusion 2004  . Bruises easily    platelets are always high  . Cancer (HCC)    cervical  . Depression    takes Zoloft nightly  . Dizziness    hx of  . Emphysema   . Gastric ulcer    NO PRIOR EGD.   Marland Kitchen GERD (gastroesophageal reflux disease)    takes Omeprazole and Ranitidine daily  . Headache(784.0)   . Heart disease   . History of kidney stones 2000  . Hyperlipidemia    doesn't take any meds at present time  . Hypertension    takes Metoprolol and Lisinopril occ  . Insomnia    xanax and pain pills help  . Liver disease   . Migraines    last on e3/10/13  . Multiple sclerosis ([place])   . Neck pain    stenosis and radiculopathy  . PONV (postoperative  nausea and vomiting)   . Stroke [facility])    TIA-22 yrs ago    Past Surgical History:  Procedure Laterality Date  . ABDOMINAL HYSTERECTOMY  2004   partial d/t fibroid tumors  . ANTERIOR CERVICAL DECOMP/DISCECTOMY FUSION  11/24/2011   Procedure: ANTERIOR CERVICAL DECOMPRESSION/DISCECTOMY FUSION 3 LEVELS;  Surgeon: Hosie Spangle, MD;  Location: [facility];  Service: Neurosurgery;  Laterality: N/A;  Cervical three-four, four-five,five-six anterior cervical decompression with fusion plating and bonegraft  . BREAST SURGERY  2002   breast reduction  . CERVICAL CONE BIOPSY  20+yrs ago   d/t cervical cancer  . ECTOPIC PREGNANCY SURGERY  1987/1989  . right hand  90's   cyst removed from top of hand    Family History  Problem Relation Age of Onset  . Arthritis Father   . Cancer Father   . Anesthesia problems Neg Hx   . Hypotension Neg Hx   . Malignant hyperthermia Neg Hx   . Pseudochol deficiency Neg Hx   . Colon cancer Neg Hx   . Colon polyps Neg Hx     Social History Social History  Substance Use Topics  . Smoking status: Current Every Day Smoker    Packs/day: 1.00    Years: 32.00  Types: Cigarettes  . Smokeless tobacco: Never Used  . Alcohol use No     Comment: quit drinking September 2017. Used to drink 3-4 40s a day     Allergies  Allergen Reactions  . Codeine Nausea And Vomiting    Current Outpatient Prescriptions  Medication Sig Dispense Refill  . acetaminophen (TYLENOL) 325 MG tablet Take 2 tablets (650 mg total) by mouth every 6 (six) hours as needed for mild pain (or Fever >/= 101). 30 tablet 1  . albuterol (PROVENTIL HFA;VENTOLIN HFA) 108 (90 Base) MCG/ACT inhaler Inhale 2 puffs into the lungs every 6 (six) hours as needed. For shortness of breath 8 g 0  . cyclobenzaprine (FLEXERIL) 10 MG tablet Take 1 tablet (10 mg total) by mouth 2 (two) times daily as needed for muscle spasms. 20 tablet 0  . lactulose (CHRONULAC) 10 GM/15ML solution Take 15 mLs (10 g  total) by mouth daily. 473 mL 0  . LORazepam (ATIVAN) 0.5 MG tablet Take 0.5 mg by mouth at bedtime. Stop Date: 09/07/16    . Multiple Vitamin (MULITIVITAMIN WITH MINERALS) TABS Take 1 tablet by mouth daily.    . nitroGLYCERIN (NITROSTAT) 0.4 MG SL tablet Place 1 tablet (0.4 mg total) under the tongue every 5 (five) minutes as needed. For chest pain 30 tablet 0  . pantoprazole (PROTONIX) 40 MG tablet Take 1 tablet (40 mg total) by mouth daily. 30 minutes before breakfast 90 tablet 3  . polyethylene glycol-electrolytes (TRILYTE) 420 g solution Take 4,000 mLs by mouth as directed. 4000 mL 0  . ranitidine (ZANTAC) 150 MG tablet Take 150 mg by mouth daily as needed for heartburn.     . spironolactone (ALDACTONE) 50 MG tablet Take 1 tablet (50 mg total) by mouth daily. 30 tablet 0   No current facility-administered medications for this visit.      Physical Exam  Blood pressure 104/72, pulse 98, height 5' (1.524 m), weight 117 lb (53.1 kg).  Constitutional: overall normal hygiene, normal nutrition, well developed, normal grooming, normal body habitus. Assistive device:none  Musculoskeletal: gait and station Limp none, muscle tone and strength are normal, no tremors or atrophy is present.  .  Neurological: coordination overall normal.  Deep tendon reflex/nerve stretch intact.  Sensation normal.  Cranial nerves II-XII intact.   Skin:   Normal overall no scars, lesions, ulcers or rashes. No psoriasis.  Psychiatric: Alert and oriented x 3.  Recent memory intact, remote memory unclear.  Normal mood and affect. Well groomed.  Good eye contact.  Cardiovascular: overall no swelling, no varicosities, no edema bilaterally, normal temperatures of the legs and arms, no clubbing, cyanosis and good capillary refill.  Lymphatic: palpation is normal.  Her right shoulder has full motion but pain in the extremes.  She has more pain with overhead positioning.  NV is intact.  Grips are normal.  The patient  has been educated about the nature of the problem(s) and counseled on treatment options.  The patient appeared to understand what I have discussed and is in agreement with it.  Encounter Diagnoses  Name Primary?  . Chronic right shoulder pain Yes  . Cigarette nicotine dependence without complication    She continues to smoke but is trying to cut back.  PLAN Call if any problems.  Precautions discussed.  Continue current medications.   Return to clinic after MRI of the right shoulder   Electronically Signed Sanjuana Kava, MD 7/19/20183:06 PM

## 2017-04-04 ENCOUNTER — Telehealth: Payer: Self-pay

## 2017-04-04 ENCOUNTER — Ambulatory Visit (HOSPITAL_COMMUNITY): Payer: Medicare HMO

## 2017-04-04 ENCOUNTER — Encounter (HOSPITAL_COMMUNITY): Payer: Self-pay

## 2017-04-04 NOTE — Therapy (Signed)
[facility] [place], [place], [address] Phone: [phone]   Fax:  [phone]  Patient Details  Name: Courtney Thornton MRN: [id number] Date of Birth: Jun 18, 1966 Referring Provider:  No ref. provider found  Encounter Date: 04/04/2017    Pt left a message on 03/29/17 asking that all remaining appointments be cancelled for personal reasons but she said it had nothing to do with [place]. PHYSICAL THERAPY DISCHARGE SUMMARY  Visits from Start of Care: 11  Current functional level related to goals / functional outcomes: See last treatment note   Remaining deficits: See  Last treatment note   Education / Equipment: n/a Plan: Patient agrees to discharge.  Patient goals were not met. Patient is being discharged due to meeting the stated rehab goals.  ?????      Geraldine Solar PT, [facility] [place] [address] [place], [place], [address] Phone: [phone]   Fax:  [phone]

## 2017-04-04 NOTE — Telephone Encounter (Signed)
Pt received Hep A vaccine on 03/31/17, in left arm. Paper work was faxed over from [employer]. I will have it scanned into the system.

## 2017-04-05 ENCOUNTER — Other Ambulatory Visit: Payer: Self-pay

## 2017-04-05 DIAGNOSIS — K769 Liver disease, unspecified: Secondary | ICD-10-CM

## 2017-04-05 NOTE — Progress Notes (Signed)
MRI with 16 mm enhancing lesion that is indeterminate. Unable to exclude a small HCC. She has not completed blood work yet. I have already ordered an AFP tumor marker. I would like to refer her to IR for liver biopsy of this lesion. I would rather know what we are dealing with. In the interim, she will be getting her blood work done. She is aware of her results.

## 2017-04-05 NOTE — Telephone Encounter (Signed)
Noted  

## 2017-04-05 NOTE — Addendum Note (Signed)
Addended by: Glory Buff on: 04/05/2017 01:26 PM   Modules accepted: Orders

## 2017-04-07 ENCOUNTER — Ambulatory Visit: Payer: Medicare HMO | Admitting: Orthopaedic Surgery

## 2017-04-07 ENCOUNTER — Ambulatory Visit (HOSPITAL_COMMUNITY): Payer: Medicare HMO

## 2017-04-07 ENCOUNTER — Other Ambulatory Visit: Payer: Self-pay

## 2017-04-07 ENCOUNTER — Other Ambulatory Visit: Payer: Medicare HMO

## 2017-04-07 DIAGNOSIS — K769 Liver disease, unspecified: Secondary | ICD-10-CM

## 2017-04-07 LAB — COMPLETE METABOLIC PANEL WITH GFR
ALBUMIN: 4.1 g/dL (ref 3.6–5.1)
ALK PHOS: 67 U/L (ref 33–130)
ALT: 13 U/L (ref 6–29)
AST: 18 U/L (ref 10–35)
BUN: 16 mg/dL (ref 7–25)
CHLORIDE: 106 mmol/L (ref 98–110)
CO2: 23 mmol/L (ref 20–31)
Calcium: 9.5 mg/dL (ref 8.6–10.4)
Creat: 0.65 mg/dL (ref 0.50–1.05)
GFR, Est African American: >89 mL/min (ref >=60)
GLUCOSE: 67 mg/dL (ref 65–99)
POTASSIUM: 3.6 mmol/L (ref 3.5–5.3)
SODIUM: 138 mmol/L (ref 135–146)
Total Bilirubin: 0.2 mg/dL (ref 0.2–1.2)
Total Protein: 7 g/dL (ref 6.1–8.1)

## 2017-04-07 LAB — CBC WITH DIFFERENTIAL/PLATELET
BASOS ABS: 85 {cells}/uL (ref 0–200)
Basophils Relative: 1 %
EOS PCT: 2 %
Eosinophils Absolute: 170 cells/uL (ref 15–500)
HCT: 34 % — ABNORMAL LOW (ref 35.0–45.0)
Hemoglobin: 11.4 g/dL — ABNORMAL LOW (ref 11.7–15.5)
LYMPHS PCT: 46 %
Lymphs Abs: 3910 cells/uL — ABNORMAL HIGH (ref 850–3900)
MCH: 30.5 pg (ref 27.0–33.0)
MCHC: 33.5 g/dL (ref 32.0–36.0)
MCV: 90.9 fL (ref 80.0–100.0)
MONOS PCT: 5 %
MPV: 9.8 fL (ref 7.5–12.5)
Monocytes Absolute: 425 cells/uL (ref 200–950)
NEUTROS PCT: 46 %
Neutro Abs: 3910 cells/uL (ref 1500–7800)
PLATELETS: 410 10*3/uL — AB (ref 140–400)
RBC: 3.74 MIL/uL — AB (ref 3.80–5.10)
RDW: 13.8 % (ref 11.0–15.0)
WBC: 8.5 10*3/uL (ref 3.8–10.8)

## 2017-04-07 LAB — IRON AND TIBC
%SAT: 22 % (ref 11–50)
IRON: 80 ug/dL (ref 45–160)
TIBC: 362 ug/dL (ref 250–450)
UIBC: 282 ug/dL

## 2017-04-07 LAB — FERRITIN: FERRITIN: 26 ng/mL (ref 10–232)

## 2017-04-07 LAB — PROTIME-INR
INR: 0.9
Prothrombin Time: 9.7 s (ref 9.0–11.5)

## 2017-04-07 LAB — AFP TUMOR MARKER: AFP-Tumor Marker: 1.6 ng/mL (ref <6.1)

## 2017-04-12 ENCOUNTER — Ambulatory Visit (INDEPENDENT_AMBULATORY_CARE_PROVIDER_SITE_OTHER): Payer: Medicare HMO

## 2017-04-12 DIAGNOSIS — M25511 Pain in right shoulder: Secondary | ICD-10-CM | POA: Diagnosis not present

## 2017-04-12 DIAGNOSIS — G8929 Other chronic pain: Secondary | ICD-10-CM | POA: Diagnosis not present

## 2017-04-13 ENCOUNTER — Encounter: Payer: Self-pay | Admitting: *Deleted

## 2017-04-13 NOTE — Progress Notes (Signed)
Normal AFP tumor marker. INR normal. Ferritin low normal at 26. No iron overload. Question component of IDA. MELD 8. Continue with procedures as planned.

## 2017-04-15 ENCOUNTER — Ambulatory Visit (HOSPITAL_COMMUNITY)
Admission: RE | Admit: 2017-04-15 | Discharge: 2017-04-15 | Disposition: A | Discharge status: Home / Self Care | Payer: Medicare HMO | Source: Ambulatory Visit | Attending: Gastroenterology | Admitting: Gastroenterology

## 2017-04-15 ENCOUNTER — Encounter (HOSPITAL_COMMUNITY): Payer: Self-pay

## 2017-04-15 ENCOUNTER — Other Ambulatory Visit: Payer: Self-pay | Admitting: Gastroenterology

## 2017-04-15 DIAGNOSIS — F1010 Alcohol abuse, uncomplicated: Secondary | ICD-10-CM | POA: Diagnosis not present

## 2017-04-15 DIAGNOSIS — K7460 Unspecified cirrhosis of liver: Secondary | ICD-10-CM | POA: Insufficient documentation

## 2017-04-15 DIAGNOSIS — K219 Gastro-esophageal reflux disease without esophagitis: Secondary | ICD-10-CM | POA: Diagnosis not present

## 2017-04-15 DIAGNOSIS — G35 Multiple sclerosis: Secondary | ICD-10-CM | POA: Insufficient documentation

## 2017-04-15 DIAGNOSIS — Z811 Family history of alcohol abuse and dependence: Secondary | ICD-10-CM | POA: Diagnosis not present

## 2017-04-15 DIAGNOSIS — Z809 Family history of malignant neoplasm, unspecified: Secondary | ICD-10-CM | POA: Insufficient documentation

## 2017-04-15 DIAGNOSIS — K769 Liver disease, unspecified: Secondary | ICD-10-CM | POA: Diagnosis present

## 2017-04-15 DIAGNOSIS — F419 Anxiety disorder, unspecified: Secondary | ICD-10-CM | POA: Insufficient documentation

## 2017-04-15 DIAGNOSIS — Z8249 Family history of ischemic heart disease and other diseases of the circulatory system: Secondary | ICD-10-CM | POA: Diagnosis not present

## 2017-04-15 DIAGNOSIS — F329 Major depressive disorder, single episode, unspecified: Secondary | ICD-10-CM | POA: Insufficient documentation

## 2017-04-15 DIAGNOSIS — Z8719 Personal history of other diseases of the digestive system: Secondary | ICD-10-CM | POA: Insufficient documentation

## 2017-04-15 DIAGNOSIS — Z90710 Acquired absence of both cervix and uterus: Secondary | ICD-10-CM | POA: Diagnosis not present

## 2017-04-15 DIAGNOSIS — G4700 Insomnia, unspecified: Secondary | ICD-10-CM | POA: Diagnosis not present

## 2017-04-15 DIAGNOSIS — Z888 Allergy status to other drugs, medicaments and biological substances status: Secondary | ICD-10-CM | POA: Diagnosis not present

## 2017-04-15 DIAGNOSIS — I10 Essential (primary) hypertension: Secondary | ICD-10-CM | POA: Diagnosis not present

## 2017-04-15 DIAGNOSIS — Z9889 Other specified postprocedural states: Secondary | ICD-10-CM | POA: Insufficient documentation

## 2017-04-15 DIAGNOSIS — F17210 Nicotine dependence, cigarettes, uncomplicated: Secondary | ICD-10-CM | POA: Insufficient documentation

## 2017-04-15 DIAGNOSIS — E785 Hyperlipidemia, unspecified: Secondary | ICD-10-CM | POA: Insufficient documentation

## 2017-04-15 DIAGNOSIS — Z8541 Personal history of malignant neoplasm of cervix uteri: Secondary | ICD-10-CM | POA: Diagnosis not present

## 2017-04-15 DIAGNOSIS — Z8261 Family history of arthritis: Secondary | ICD-10-CM | POA: Insufficient documentation

## 2017-04-15 DIAGNOSIS — Z8673 Personal history of transient ischemic attack (TIA), and cerebral infarction without residual deficits: Secondary | ICD-10-CM | POA: Insufficient documentation

## 2017-04-15 DIAGNOSIS — J449 Chronic obstructive pulmonary disease, unspecified: Secondary | ICD-10-CM | POA: Insufficient documentation

## 2017-04-15 LAB — CBC
HEMATOCRIT: 34.5 % — AB (ref 36.0–46.0)
HEMOGLOBIN: 11.3 g/dL — AB (ref 12.0–15.0)
MCH: 29.7 pg (ref 26.0–34.0)
MCHC: 32.8 g/dL (ref 30.0–36.0)
MCV: 90.8 fL (ref 78.0–100.0)
Platelets: 418 10*3/uL — ABNORMAL HIGH (ref 150–400)
RBC: 3.8 MIL/uL — ABNORMAL LOW (ref 3.87–5.11)
RDW: 13.7 % (ref 11.5–15.5)
WBC: 9.2 10*3/uL (ref 4.0–10.5)

## 2017-04-15 LAB — PROTIME-INR
INR: 0.93
PROTHROMBIN TIME: 12.5 s (ref 11.4–15.2)

## 2017-04-15 MED ORDER — MIDAZOLAM HCL 2 MG/2ML IJ SOLN
INTRAMUSCULAR | Status: AC
  Filled 2017-04-15: qty 2

## 2017-04-15 MED ORDER — LIDOCAINE HCL (PF) 1 % IJ SOLN
INTRAMUSCULAR | Status: AC
  Filled 2017-04-15: qty 30

## 2017-04-15 MED ORDER — FENTANYL CITRATE (PF) 100 MCG/2ML IJ SOLN
INTRAMUSCULAR | Status: AC
  Filled 2017-04-15: qty 2

## 2017-04-15 MED ORDER — SODIUM CHLORIDE 0.9 % IV SOLN: INTRAVENOUS | Status: DC

## 2017-04-15 NOTE — Sedation Documentation (Signed)
Biopsy cancelled as mass not found with Ultrasound. Dr Anselm Pancoast will let ordering Dr know. He told patient that it should be followed, maybe with another MRI in 3 months. Husband has left the hospital so 2 messages left that his wife can be picked up in Radiology nurses station. Patient IV d/c and given snack and beverage. Report given to [facility].

## 2017-04-15 NOTE — Progress Notes (Signed)
Pt cancelled in radiology.  Time of discharge unknown.

## 2017-04-15 NOTE — Progress Notes (Signed)
Patient ID: Courtney Thornton, female   DOB: [birth year], 51 y.o.   MRN: [id number] Evaluated liver with [place].  Could not identify the lesion seen on MR.  CT-guided biopsy of this lesion will also be very difficult.  Liver biopsy was cancelled.  Recommend close surveillance of this lesion with MRI with a 3 month follow-up.

## 2017-04-15 NOTE — H&P (Signed)
Chief Complaint: liver lesion  Referring Physician:Dr. Roseanne Kaufman  Supervising Physician: Markus Daft  Patient Status: [facility] - Out-pt  HPI: Courtney Thornton is a 51 y.o. female with a history of ETOH abuse, cervical cancer, MS, COPD, who recently had an MRI of her liver secondary to a lesion seen on a CT scan in November of 2017.  She also has a history of ETOH abuse and cirrhosis.  The MRI revealed a 50mm enhancing lesion in segment 7 of the liver which was felt to be indeterminate.  Her AFP is normal.  A request for a biopsy has been made though to help determine etiology.  The patient has no acute complaints currently.  Past Medical History:  Past Medical History:  Diagnosis Date  . Alcohol dependence ([place])   . Anxiety    takes Xanax daily  . Arthritis    shoulders  . Blood transfusion 2004  . Bruises easily    platelets are always high  . Cancer ([place])    cervical  . COPD (chronic obstructive pulmonary disease) ([place])   . Depression    takes Zoloft nightly  . Dizziness    hx of  . Emphysema   . Gastric ulcer    NO PRIOR EGD.   Marland Kitchen GERD (gastroesophageal reflux disease)    takes Omeprazole and Ranitidine daily  . Headache(784.0)   . Heart disease   . History of kidney stones 2000  . Hyperlipidemia    doesn't take any meds at present time  . Hypertension    takes Metoprolol and Lisinopril occ  . Insomnia    xanax and pain pills help  . Liver disease   . Migraines    last on e3/10/13  . Multiple sclerosis ([place])   . Neck pain    stenosis and radiculopathy  . PONV (postoperative nausea and vomiting)   . Stroke [facility])    TIA-22 yrs ago    Past Surgical History:  Past Surgical History:  Procedure Laterality Date  . ABDOMINAL HYSTERECTOMY  2004   partial d/t fibroid tumors  . ANTERIOR CERVICAL DECOMP/DISCECTOMY FUSION  11/24/2011   Procedure: ANTERIOR CERVICAL DECOMPRESSION/DISCECTOMY FUSION 3 LEVELS;  Surgeon: Hosie Spangle, MD;  Location: [facility];   Service: Neurosurgery;  Laterality: N/A;  Cervical three-four, four-five,five-six anterior cervical decompression with fusion plating and bonegraft  . BREAST SURGERY  2002   breast reduction  . CERVICAL CONE BIOPSY  20+yrs ago   d/t cervical cancer  . ECTOPIC PREGNANCY SURGERY  1987/1989  . right hand  90's   cyst removed from top of hand  . TUBAL LIGATION      Family History:  Family History  Problem Relation Age of Onset  . Arthritis Father   . Cancer Father   . Alcoholism Mother   . Hypertension Mother   . Aneurysm Sister   . Anemia Daughter   . Anesthesia problems Neg Hx   . Hypotension Neg Hx   . Malignant hyperthermia Neg Hx   . Pseudochol deficiency Neg Hx   . Colon cancer Neg Hx   . Colon polyps Neg Hx     Social History:  reports that she has been smoking Cigarettes.  She has a 32.00 pack-year smoking history. She has never used smokeless tobacco. She reports that she uses drugs, including Marijuana. She reports that she does not drink alcohol.  Allergies:  Allergies  Allergen Reactions  . Codeine Nausea And Vomiting    Medications:  Medications reviewed in epic  Please HPI for pertinent positives, otherwise complete 10 system ROS negative.  Mallampati Score: MD Evaluation Airway: WNL Heart: WNL Abdomen: WNL Chest/ Lungs: WNL ASA  Classification: 3 Mallampati/Airway Score: One  Physical Exam: General: pleasant, WD, WN black female who is laying in bed in NAD HEENT: head is normocephalic, atraumatic.  Sclera are noninjected.  PERRL.  Ears and nose without any masses or lesions.  Mouth is pink and moist Heart: regular, rate, and rhythm.  Normal s1,s2. No obvious murmurs, gallops, or rubs noted.  Palpable radial pulses bilaterally Lungs: CTAB, no wheezes, rhonchi, or rales noted.  Respiratory effort nonlabored Abd: soft, NT, ND, +BS, no masses, hernias, or organomegaly Psych: A&Ox3 with an appropriate affect.   Labs: Pending  Imaging: No results  found.  Assessment/Plan 1. 74mm liver lesion  We will plan to proceed today with a liver lesion biopsy.  Her labs are pending.  Risks and benefits discussed with the patient including, but not limited to bleeding, infection, damage to adjacent structures or low yield requiring additional tests. All of the patient's questions were answered, patient is agreeable to proceed. Consent signed and in chart.   Thank you for this interesting consult.  I greatly enjoyed meeting Courtney Thornton and look forward to participating in their care.  A copy of this report was sent to the requesting provider on this date.  Electronically Signed: Henreitta Cea 04/15/2017, 12:32 PM   I spent a total of    30 minutes in face to face in clinical consultation, greater than 50% of which was counseling/coordinating care for liver lesion

## 2017-04-18 ENCOUNTER — Ambulatory Visit (HOSPITAL_COMMUNITY): Admission: RE | Admit: 2017-04-18 | Payer: Medicare HMO | Source: Ambulatory Visit

## 2017-04-19 NOTE — Progress Notes (Signed)
Received message from Dr. Anselm Pancoast with IR. The lesion was not visible with ultrasound and would be difficult to biopsy with CT. Let's repeat MRI with EOVIST of liver lesion in 3 months for close surveillance. AFP is normal.

## 2017-04-20 ENCOUNTER — Ambulatory Visit: Payer: Medicare HMO | Admitting: Orthopaedic Surgery

## 2017-04-21 NOTE — Progress Notes (Signed)
ON RECALL  °

## 2017-04-22 ENCOUNTER — Encounter (HOSPITAL_COMMUNITY): Admission: RE | Admit: 2017-04-22 | Payer: Medicare HMO | Source: Ambulatory Visit

## 2017-04-25 ENCOUNTER — Encounter (HOSPITAL_COMMUNITY)
Admission: RE | Admit: 2017-04-25 | Discharge: 2017-04-25 | Disposition: A | Discharge status: Home / Self Care | Payer: Medicare HMO | Source: Ambulatory Visit | Attending: Internal Medicine | Admitting: Internal Medicine

## 2017-04-25 ENCOUNTER — Ambulatory Visit (HOSPITAL_COMMUNITY)
Admission: RE | Admit: 2017-04-25 | Discharge: 2017-04-25 | Disposition: A | Discharge status: Home / Self Care | Payer: Medicare HMO | Source: Ambulatory Visit | Attending: Orthopaedic Surgery | Admitting: Orthopaedic Surgery

## 2017-04-25 ENCOUNTER — Encounter (HOSPITAL_COMMUNITY): Payer: Self-pay

## 2017-04-25 DIAGNOSIS — S46111A Strain of muscle, fascia and tendon of long head of biceps, right arm, initial encounter: Secondary | ICD-10-CM | POA: Diagnosis not present

## 2017-04-25 DIAGNOSIS — X58XXXA Exposure to other specified factors, initial encounter: Secondary | ICD-10-CM | POA: Diagnosis not present

## 2017-04-25 DIAGNOSIS — M25511 Pain in right shoulder: Secondary | ICD-10-CM

## 2017-04-25 DIAGNOSIS — M25411 Effusion, right shoulder: Secondary | ICD-10-CM | POA: Insufficient documentation

## 2017-04-25 DIAGNOSIS — G8929 Other chronic pain: Secondary | ICD-10-CM

## 2017-04-25 DIAGNOSIS — M19011 Primary osteoarthritis, right shoulder: Secondary | ICD-10-CM | POA: Insufficient documentation

## 2017-04-25 DIAGNOSIS — M75101 Unspecified rotator cuff tear or rupture of right shoulder, not specified as traumatic: Secondary | ICD-10-CM | POA: Insufficient documentation

## 2017-04-25 NOTE — Patient Instructions (Signed)
Courtney Thornton  04/25/2017     @PREFPERIOPPHARMACY @   Your procedure is scheduled on  04/27/2017 .  Report to [facility] at  1115  A.M.  Call this number if you have problems the morning of surgery:  [phone]   Remember:  Do not eat food or drink liquids after midnight.  Take these medicines the morning of surgery with A SIP OF WATER  Flexaril, gabapentin. Take your inhaler before you come.   Do not wear jewelry, make-up or nail polish.  Do not wear lotions, powders, or perfumes, or deoderant.  Do not shave 48 hours prior to surgery.  Men may shave face and neck.  Do not bring valuables to the hospital.  [facility] is not responsible for any belongings or valuables.  Contacts, dentures or bridgework may not be worn into surgery.  Leave your suitcase in the car.  After surgery it may be brought to your room.  For patients admitted to the hospital, discharge time will be determined by your treatment team.  Patients discharged the day of surgery will not be allowed to drive home.   Name and phone number of your driver:  family Special instructions:  Follow the diet and prep instructions given to you by Dr Roseanne Kaufman office.  Please read over the following fact sheets that you were given. Anesthesia Post-op Instructions and Care and Recovery After Surgery       Esophagogastroduodenoscopy Esophagogastroduodenoscopy (EGD) is a procedure to examine the lining of the esophagus, stomach, and first part of the small intestine (duodenum). This procedure is done to check for problems such as inflammation, bleeding, ulcers, or growths. During this procedure, a long, flexible, lighted tube with a camera attached (endoscope) is inserted down the throat. Tell a health care provider about:  Any allergies you have.  All medicines you are taking, including vitamins, herbs, eye drops, creams, and over-the-counter medicines.  Any problems you or family members  have had with anesthetic medicines.  Any blood disorders you have.  Any surgeries you have had.  Any medical conditions you have.  Whether you are pregnant or may be pregnant. What are the risks? Generally, this is a safe procedure. However, problems may occur, including:  Infection.  Bleeding.  A tear (perforation) in the esophagus, stomach, or duodenum.  Trouble breathing.  Excessive sweating.  Spasms of the larynx.  A slowed heartbeat.  Low blood pressure.  What happens before the procedure?  Follow instructions from your health care provider about eating or drinking restrictions.  Ask your health care provider about: ? Changing or stopping your regular medicines. This is especially important if you are taking diabetes medicines or blood thinners. ? Taking medicines such as aspirin and ibuprofen. These medicines can thin your blood. Do not take these medicines before your procedure if your health care provider instructs you not to.  Plan to have someone take you home after the procedure.  If you wear dentures, be ready to remove them before the procedure. What happens during the procedure?  To reduce your risk of infection, your health care team will wash or sanitize their hands.  An IV tube will be put in a vein in your hand or arm. You will get medicines and fluids through this tube.  You will be given one or more of the following: ? A medicine to help you relax (sedative). ? A medicine to numb  the area (local anesthetic). This medicine may be sprayed into your throat. It will make you feel more comfortable and keep you from gagging or coughing during the procedure. ? A medicine for pain.  A mouth guard may be placed in your mouth to protect your teeth and to keep you from biting on the endoscope.  You will be asked to lie on your left side.  The endoscope will be lowered down your throat into your esophagus, stomach, and duodenum.  Air will be put into  the endoscope. This will help your health care provider see better.  The lining of your esophagus, stomach, and duodenum will be examined.  Your health care provider may: ? Take a tissue sample so it can be looked at in a lab (biopsy). ? Remove growths. ? Remove objects (foreign bodies) that are stuck. ? Treat any bleeding with medicines or other devices that stop tissue from bleeding. ? Widen (dilate) or stretch narrowed areas of your esophagus and stomach.  The endoscope will be taken out. The procedure may vary among health care providers and hospitals. What happens after the procedure?  Your blood pressure, heart rate, breathing rate, and blood oxygen level will be monitored often until the medicines you were given have worn off.  Do not eat or drink anything until the numbing medicine has worn off and your gag reflex has returned. This information is not intended to replace advice given to you by your health care provider. Make sure you discuss any questions you have with your health care provider. Document Released: 12/31/2004 Document Revised: 02/05/2016 Document Reviewed: 07/24/2015 Elsevier Interactive Patient Education  2018 [employer]. Esophagogastroduodenoscopy, Care After Refer to this sheet in the next few weeks. These instructions provide you with information about caring for yourself after your procedure. Your health care provider may also give you more specific instructions. Your treatment has been planned according to current medical practices, but problems sometimes occur. Call your health care provider if you have any problems or questions after your procedure. What can I expect after the procedure? After the procedure, it is common to have:  A sore throat.  Nausea.  Bloating.  Dizziness.  Fatigue.  Follow these instructions at home:  Do not eat or drink anything until the numbing medicine (local anesthetic) has worn off and your gag reflex has returned.  You will know that the local anesthetic has worn off when you can swallow comfortably.  Do not drive for 24 hours if you received a medicine to help you relax (sedative).  If your health care provider took a tissue sample for testing during the procedure, make sure to get your test results. This is your responsibility. Ask your health care provider or the department performing the test when your results will be ready.  Keep all follow-up visits as told by your health care provider. This is important. Contact a health care provider if:  You cannot stop coughing.  You are not urinating.  You are urinating less than usual. Get help right away if:  You have trouble swallowing.  You cannot eat or drink.  You have throat or chest pain that gets worse.  You are dizzy or light-headed.  You faint.  You have nausea or vomiting.  You have chills.  You have a fever.  You have severe abdominal pain.  You have black, tarry, or bloody stools. This information is not intended to replace advice given to you by your health care provider. Make sure  you discuss any questions you have with your health care provider. Document Released: 08/16/2012 Document Revised: 02/05/2016 Document Reviewed: 07/24/2015 Elsevier Interactive Patient Education  2018 [employer].  Esophageal Dilatation Esophageal dilatation is a procedure to open a blocked or narrowed part of the esophagus. The esophagus is the long tube in your throat that carries food and liquid from your mouth to your stomach. The procedure is also called esophageal dilation. You may need this procedure if you have a buildup of scar tissue in your esophagus that makes it difficult, painful, or even impossible to swallow. This can be caused by gastroesophageal reflux disease (GERD). In rare cases, people need this procedure because they have cancer of the esophagus or a problem with the way food moves through the esophagus. Sometimes you may  need to have another dilatation to enlarge the opening of the esophagus gradually. Tell a health care provider about:  Any allergies you have.  All medicines you are taking, including vitamins, herbs, eye drops, creams, and over-the-counter medicines.  Any problems you or family members have had with anesthetic medicines.  Any blood disorders you have.  Any surgeries you have had.  Any medical conditions you have.  Any antibiotic medicines you are required to take before dental procedures. What are the risks? Generally, this is a safe procedure. However, problems can occur and include:  Bleeding from a tear in the lining of the esophagus.  A hole (perforation) in the esophagus.  What happens before the procedure?  Do not eat or drink anything after midnight on the night before the procedure or as directed by your health care provider.  Ask your health care provider about changing or stopping your regular medicines. This is especially important if you are taking diabetes medicines or blood thinners.  Plan to have someone take you home after the procedure. What happens during the procedure?  You will be given a medicine that makes you relaxed and sleepy (sedative).  A medicine may be sprayed or gargled to numb the back of the throat.  Your health care provider can use various instruments to do an esophageal dilatation. During the procedure, the instrument used will be placed in your mouth and passed down into your esophagus. Options include: ? Simple dilators. This instrument is carefully placed in the esophagus to stretch it. ? Guided wire bougies. In this method, a flexible tube (endoscope) is used to insert a wire into the esophagus. The dilator is passed over this wire to enlarge the esophagus. Then the wire is removed. ? Balloon dilators. An endoscope with a small balloon at the end is passed down into the esophagus. Inflating the balloon gently stretches the esophagus and  opens it up. What happens after the procedure?  Your blood pressure, heart rate, breathing rate, and blood oxygen level will be monitored often until the medicines you were given have worn off.  Your throat may feel slightly sore and will probably still feel numb. This will improve slowly over time.  You will not be allowed to eat or drink until the throat numbness has resolved.  If this is a same-day procedure, you may be allowed to go home once you have been able to drink, urinate, and sit on the edge of the bed without nausea or dizziness.  If this is a same-day procedure, you should have a friend or family member with you for the next 24 hours after the procedure. This information is not intended to replace advice given to  you by your health care provider. Make sure you discuss any questions you have with your health care provider. Document Released: 10/21/2005 Document Revised: 02/05/2016 Document Reviewed: 01/09/2014 Elsevier Interactive Patient Education  2017 [employer].  Colonoscopy, Adult A colonoscopy is an exam to look at the entire large intestine. During the exam, a lubricated, bendable tube is inserted into the anus and then passed into the rectum, colon, and other parts of the large intestine. A colonoscopy is often done as a part of normal colorectal screening or in response to certain symptoms, such as anemia, persistent diarrhea, abdominal pain, and blood in the stool. The exam can help screen for and diagnose medical problems, including:  Tumors.  Polyps.  Inflammation.  Areas of bleeding.  Tell a health care provider about:  Any allergies you have.  All medicines you are taking, including vitamins, herbs, eye drops, creams, and over-the-counter medicines.  Any problems you or family members have had with anesthetic medicines.  Any blood disorders you have.  Any surgeries you have had.  Any medical conditions you have.  Any problems you have had  passing stool. What are the risks? Generally, this is a safe procedure. However, problems may occur, including:  Bleeding.  A tear in the intestine.  A reaction to medicines given during the exam.  Infection (rare).  What happens before the procedure? Eating and drinking restrictions Follow instructions from your health care provider about eating and drinking, which may include:  A few days before the procedure - follow a low-fiber diet. Avoid nuts, seeds, dried fruit, raw fruits, and vegetables.  1-3 days before the procedure - follow a clear liquid diet. Drink only clear liquids, such as clear broth or bouillon, black coffee or tea, clear juice, clear soft drinks or sports drinks, gelatin dessert, and popsicles. Avoid any liquids that contain red or purple dye.  On the day of the procedure - do not eat or drink anything during the 2 hours before the procedure, or within the time period that your health care provider recommends.  Bowel prep If you were prescribed an oral bowel prep to clean out your colon:  Take it as told by your health care provider. Starting the day before your procedure, you will need to drink a large amount of medicated liquid. The liquid will cause you to have multiple loose stools until your stool is almost clear or light green.  If your skin or anus gets irritated from diarrhea, you may use these to relieve the irritation: ? Medicated wipes, such as adult wet wipes with aloe and vitamin E. ? A skin soothing-product like petroleum jelly.  If you vomit while drinking the bowel prep, take a break for up to 60 minutes and then begin the bowel prep again. If vomiting continues and you cannot take the bowel prep without vomiting, call your health care provider.  General instructions  Ask your health care provider about changing or stopping your regular medicines. This is especially important if you are taking diabetes medicines or blood thinners.  Plan to have  someone take you home from the hospital or clinic. What happens during the procedure?  An IV tube may be inserted into one of your veins.  You will be given medicine to help you relax (sedative).  To reduce your risk of infection: ? Your health care team will wash or sanitize their hands. ? Your anal area will be washed with soap.  You will be asked to lie on  your side with your knees bent.  Your health care provider will lubricate a long, thin, flexible tube. The tube will have a camera and a light on the end.  The tube will be inserted into your anus.  The tube will be gently eased through your rectum and colon.  Air will be delivered into your colon to keep it open. You may feel some pressure or cramping.  The camera will be used to take images during the procedure.  A small tissue sample may be removed from your body to be examined under a microscope (biopsy). If any potential problems are found, the tissue will be sent to a lab for testing.  If small polyps are found, your health care provider may remove them and have them checked for cancer cells.  The tube that was inserted into your anus will be slowly removed. The procedure may vary among health care providers and hospitals. What happens after the procedure?  Your blood pressure, heart rate, breathing rate, and blood oxygen level will be monitored until the medicines you were given have worn off.  Do not drive for 24 hours after the exam.  You may have a small amount of blood in your stool.  You may pass gas and have mild abdominal cramping or bloating due to the air that was used to inflate your colon during the exam.  It is up to you to get the results of your procedure. Ask your health care provider, or the department performing the procedure, when your results will be ready. This information is not intended to replace advice given to you by your health care provider. Make sure you discuss any questions you have  with your health care provider. Document Released: 08/27/2000 Document Revised: 06/30/2016 Document Reviewed: 11/11/2015 Elsevier Interactive Patient Education  2018 [employer].  Colonoscopy, Adult, Care After This sheet gives you information about how to care for yourself after your procedure. Your health care provider may also give you more specific instructions. If you have problems or questions, contact your health care provider. What can I expect after the procedure? After the procedure, it is common to have:  A small amount of blood in your stool for 24 hours after the procedure.  Some gas.  Mild abdominal cramping or bloating.  Follow these instructions at home: General instructions   For the first 24 hours after the procedure: ? Do not drive or use machinery. ? Do not sign important documents. ? Do not drink alcohol. ? Do your regular daily activities at a slower pace than normal. ? Eat soft, easy-to-digest foods. ? Rest often.  Take over-the-counter or prescription medicines only as told by your health care provider.  It is up to you to get the results of your procedure. Ask your health care provider, or the department performing the procedure, when your results will be ready. Relieving cramping and bloating  Try walking around when you have cramps or feel bloated.  Apply heat to your abdomen as told by your health care provider. Use a heat source that your health care provider recommends, such as a moist heat pack or a heating pad. ? Place a towel between your skin and the heat source. ? Leave the heat on for 20-30 minutes. ? Remove the heat if your skin turns bright red. This is especially important if you are unable to feel pain, heat, or cold. You may have a greater risk of getting burned. Eating and drinking  Drink enough fluid  to keep your urine clear or pale yellow.  Resume your normal diet as instructed by your health care provider. Avoid heavy or fried  foods that are hard to digest.  Avoid drinking alcohol for as long as instructed by your health care provider. Contact a health care provider if:  You have blood in your stool 2-3 days after the procedure. Get help right away if:  You have more than a small spotting of blood in your stool.  You pass large blood clots in your stool.  Your abdomen is swollen.  You have nausea or vomiting.  You have a fever.  You have increasing abdominal pain that is not relieved with medicine. This information is not intended to replace advice given to you by your health care provider. Make sure you discuss any questions you have with your health care provider. Document Released: 04/13/2004 Document Revised: 05/24/2016 Document Reviewed: 11/11/2015 Elsevier Interactive Patient Education  2018 [place] Anesthesia is a term that refers to techniques, procedures, and medicines that help a person stay safe and comfortable during a medical procedure. Monitored anesthesia care, or sedation, is one type of anesthesia. Your anesthesia specialist may recommend sedation if you will be having a procedure that does not require you to be unconscious, such as:  Cataract surgery.  A dental procedure.  A biopsy.  A colonoscopy.  During the procedure, you may receive a medicine to help you relax (sedative). There are three levels of sedation:  Mild sedation. At this level, you may feel awake and relaxed. You will be able to follow directions.  Moderate sedation. At this level, you will be sleepy. You may not remember the procedure.  Deep sedation. At this level, you will be asleep. You will not remember the procedure.  The more medicine you are given, the deeper your level of sedation will be. Depending on how you respond to the procedure, the anesthesia specialist may change your level of sedation or the type of anesthesia to fit your needs. An anesthesia specialist will  monitor you closely during the procedure. Let your health care provider know about:  Any allergies you have.  All medicines you are taking, including vitamins, herbs, eye drops, creams, and over-the-counter medicines.  Any use of steroids (by mouth or as a cream).  Any problems you or family members have had with sedatives and anesthetic medicines.  Any blood disorders you have.  Any surgeries you have had.  Any medical conditions you have, such as sleep apnea.  Whether you are pregnant or may be pregnant.  Any use of cigarettes, alcohol, or street drugs. What are the risks? Generally, this is a safe procedure. However, problems may occur, including:  Getting too much medicine (oversedation).  Nausea.  Allergic reaction to medicines.  Trouble breathing. If this happens, a breathing tube may be used to help with breathing. It will be removed when you are awake and breathing on your own.  Heart trouble.  Lung trouble.  Before the procedure Staying hydrated Follow instructions from your health care provider about hydration, which may include:  Up to 2 hours before the procedure - you may continue to drink clear liquids, such as water, clear fruit juice, black coffee, and plain tea.  Eating and drinking restrictions Follow instructions from your health care provider about eating and drinking, which may include:  8 hours before the procedure - stop eating heavy meals or foods such as meat, fried foods, or fatty foods.  6 hours before the procedure - stop eating light meals or foods, such as toast or cereal.  6 hours before the procedure - stop drinking milk or drinks that contain milk.  2 hours before the procedure - stop drinking clear liquids.  Medicines Ask your health care provider about:  Changing or stopping your regular medicines. This is especially important if you are taking diabetes medicines or blood thinners.  Taking medicines such as aspirin and  ibuprofen. These medicines can thin your blood. Do not take these medicines before your procedure if your health care provider instructs you not to.  Tests and exams  You will have a physical exam.  You may have blood tests done to show: ? How well your kidneys and liver are working. ? How well your blood can clot.  General instructions  Plan to have someone take you home from the hospital or clinic.  If you will be going home right after the procedure, plan to have someone with you for 24 hours.  What happens during the procedure?  Your blood pressure, heart rate, breathing, level of pain and overall condition will be monitored.  An IV tube will be inserted into one of your veins.  Your anesthesia specialist will give you medicines as needed to keep you comfortable during the procedure. This may mean changing the level of sedation.  The procedure will be performed. After the procedure  Your blood pressure, heart rate, breathing rate, and blood oxygen level will be monitored until the medicines you were given have worn off.  Do not drive for 24 hours if you received a sedative.  You may: ? Feel sleepy, clumsy, or nauseous. ? Feel forgetful about what happened after the procedure. ? Have a sore throat if you had a breathing tube during the procedure. ? Vomit. This information is not intended to replace advice given to you by your health care provider. Make sure you discuss any questions you have with your health care provider. Document Released: 05/26/2005 Document Revised: 02/06/2016 Document Reviewed: 12/21/2015 Elsevier Interactive Patient Education  2018 [place], Care After These instructions provide you with information about caring for yourself after your procedure. Your health care provider may also give you more specific instructions. Your treatment has been planned according to current medical practices, but problems sometimes occur.  Call your health care provider if you have any problems or questions after your procedure. What can I expect after the procedure? After your procedure, it is common to:  Feel sleepy for several hours.  Feel clumsy and have poor balance for several hours.  Feel forgetful about what happened after the procedure.  Have poor judgment for several hours.  Feel nauseous or vomit.  Have a sore throat if you had a breathing tube during the procedure.  Follow these instructions at home: For at least 24 hours after the procedure:   Do not: ? Participate in activities in which you could fall or become injured. ? Drive. ? Use heavy machinery. ? Drink alcohol. ? Take sleeping pills or medicines that cause drowsiness. ? Make important decisions or sign legal documents. ? Take care of children on your own.  Rest. Eating and drinking  Follow the diet that is recommended by your health care provider.  If you vomit, drink water, juice, or soup when you can drink without vomiting.  Make sure you have little or no nausea before eating solid foods. General instructions  Have a responsible adult stay  with you until you are awake and alert.  Take over-the-counter and prescription medicines only as told by your health care provider.  If you smoke, do not smoke without supervision.  Keep all follow-up visits as told by your health care provider. This is important. Contact a health care provider if:  You keep feeling nauseous or you keep vomiting.  You feel light-headed.  You develop a rash.  You have a fever. Get help right away if:  You have trouble breathing. This information is not intended to replace advice given to you by your health care provider. Make sure you discuss any questions you have with your health care provider. Document Released: 12/21/2015 Document Revised: 04/21/2016 Document Reviewed: 12/21/2015 Elsevier Interactive Patient Education  [employer].

## 2017-04-27 ENCOUNTER — Encounter (HOSPITAL_COMMUNITY)
Admission: RE | Disposition: A | Discharge status: Home / Self Care | Payer: Self-pay | Source: Ambulatory Visit | Attending: Internal Medicine

## 2017-04-27 ENCOUNTER — Ambulatory Visit (HOSPITAL_COMMUNITY): Payer: Medicare HMO | Admitting: Anesthesiology

## 2017-04-27 ENCOUNTER — Ambulatory Visit (HOSPITAL_COMMUNITY)
Admission: RE | Admit: 2017-04-27 | Discharge: 2017-04-27 | Disposition: A | Discharge status: Home / Self Care | Payer: Medicare HMO | Source: Ambulatory Visit | Attending: Internal Medicine | Admitting: Internal Medicine

## 2017-04-27 ENCOUNTER — Encounter (HOSPITAL_COMMUNITY): Payer: Self-pay | Admitting: *Deleted

## 2017-04-27 DIAGNOSIS — K921 Melena: Secondary | ICD-10-CM | POA: Insufficient documentation

## 2017-04-27 DIAGNOSIS — K625 Hemorrhage of anus and rectum: Secondary | ICD-10-CM

## 2017-04-27 DIAGNOSIS — K766 Portal hypertension: Secondary | ICD-10-CM | POA: Diagnosis not present

## 2017-04-27 DIAGNOSIS — R1310 Dysphagia, unspecified: Secondary | ICD-10-CM | POA: Diagnosis not present

## 2017-04-27 DIAGNOSIS — K7460 Unspecified cirrhosis of liver: Secondary | ICD-10-CM | POA: Insufficient documentation

## 2017-04-27 DIAGNOSIS — I10 Essential (primary) hypertension: Secondary | ICD-10-CM | POA: Insufficient documentation

## 2017-04-27 DIAGNOSIS — F17210 Nicotine dependence, cigarettes, uncomplicated: Secondary | ICD-10-CM | POA: Diagnosis not present

## 2017-04-27 DIAGNOSIS — I2510 Atherosclerotic heart disease of native coronary artery without angina pectoris: Secondary | ICD-10-CM | POA: Insufficient documentation

## 2017-04-27 DIAGNOSIS — K648 Other hemorrhoids: Secondary | ICD-10-CM | POA: Insufficient documentation

## 2017-04-27 DIAGNOSIS — K449 Diaphragmatic hernia without obstruction or gangrene: Secondary | ICD-10-CM | POA: Insufficient documentation

## 2017-04-27 DIAGNOSIS — J449 Chronic obstructive pulmonary disease, unspecified: Secondary | ICD-10-CM | POA: Insufficient documentation

## 2017-04-27 DIAGNOSIS — K3189 Other diseases of stomach and duodenum: Secondary | ICD-10-CM | POA: Insufficient documentation

## 2017-04-27 HISTORY — PX: ESOPHAGOGASTRODUODENOSCOPY (EGD) WITH PROPOFOL: SHX5813

## 2017-04-27 HISTORY — PX: COLONOSCOPY WITH PROPOFOL: SHX5780

## 2017-04-27 HISTORY — PX: MALONEY DILATION: SHX5535

## 2017-04-27 SURGERY — COLONOSCOPY WITH PROPOFOL
Anesthesia: Monitor Anesthesia Care

## 2017-04-27 MED ORDER — PROPOFOL 500 MG/50ML IV EMUL
INTRAVENOUS | Status: DC | PRN
  Administered 2017-04-27 (×2): 150 ug/kg/min via INTRAVENOUS

## 2017-04-27 MED ORDER — PROPOFOL 10 MG/ML IV BOLUS
INTRAVENOUS | Status: AC
  Filled 2017-04-27: qty 20

## 2017-04-27 MED ORDER — MIDAZOLAM HCL 2 MG/2ML IJ SOLN
1.0000 mg | INTRAMUSCULAR | Status: AC
  Administered 2017-04-27: 2 mg via INTRAVENOUS

## 2017-04-27 MED ORDER — FENTANYL CITRATE (PF) 100 MCG/2ML IJ SOLN
25.0000 ug | Freq: Once | INTRAMUSCULAR | Status: AC
  Administered 2017-04-27: 25 ug via INTRAVENOUS

## 2017-04-27 MED ORDER — LIDOCAINE VISCOUS 2 % MT SOLN
5.0000 mL | Freq: Once | OROMUCOSAL | Status: AC
  Administered 2017-04-27: 5 mL via OROMUCOSAL

## 2017-04-27 MED ORDER — LIDOCAINE VISCOUS 2 % MT SOLN
OROMUCOSAL | Status: AC
  Filled 2017-04-27: qty 15

## 2017-04-27 MED ORDER — CHLORHEXIDINE GLUCONATE CLOTH 2 % EX PADS: 6.0000 | MEDICATED_PAD | Freq: Once | CUTANEOUS | Status: DC

## 2017-04-27 MED ORDER — MIDAZOLAM HCL 2 MG/2ML IJ SOLN
INTRAMUSCULAR | Status: AC
  Filled 2017-04-27: qty 2

## 2017-04-27 MED ORDER — FENTANYL CITRATE (PF) 100 MCG/2ML IJ SOLN
INTRAMUSCULAR | Status: AC
  Filled 2017-04-27: qty 2

## 2017-04-27 MED ORDER — LIDOCAINE HCL (CARDIAC) 10 MG/ML IV SOLN
INTRAVENOUS | Status: DC | PRN
  Administered 2017-04-27: 30 mg via INTRAVENOUS

## 2017-04-27 MED ORDER — LACTATED RINGERS IV SOLN
INTRAVENOUS | Status: DC
  Administered 2017-04-27: 13:00:00 via INTRAVENOUS

## 2017-04-27 NOTE — H&P (Signed)
@LOGO @   Primary Care Physician:  Vidal Schwalbe, MD Primary Gastroenterologist:  Dr. Gala Romney  Pre-Procedure History & Physical: HPI:  Courtney Thornton is a 51 y.o. female here for further evaluation of dysphagia via EGD and first-ever colonoscopy to assess rectal bleeding per plan.  Past Medical History:  Diagnosis Date  . Alcohol dependence ([place])   . Anxiety    takes Xanax daily  . Arthritis    shoulders  . Blood transfusion 2004  . Bruises easily    platelets are always high  . Cancer ([place])    cervical  . COPD (chronic obstructive pulmonary disease) ([place])   . Depression    takes Zoloft nightly  . Dizziness    hx of  . Emphysema   . Gastric ulcer    NO PRIOR EGD.   Marland Kitchen GERD (gastroesophageal reflux disease)    takes Omeprazole and Ranitidine daily  . Headache(784.0)   . Heart disease   . History of kidney stones 2000  . Hyperlipidemia    doesn't take any meds at present time  . Hypertension    takes Metoprolol and Lisinopril occ  . Insomnia    xanax and pain pills help  . Liver disease   . Migraines    last on e3/10/13  . Multiple sclerosis ([place])   . Neck pain    stenosis and radiculopathy  . PONV (postoperative nausea and vomiting)   . Stroke [facility])    TIA-22 yrs ago    Past Surgical History:  Procedure Laterality Date  . ABDOMINAL HYSTERECTOMY  2004   partial d/t fibroid tumors  . ANTERIOR CERVICAL DECOMP/DISCECTOMY FUSION  11/24/2011   Procedure: ANTERIOR CERVICAL DECOMPRESSION/DISCECTOMY FUSION 3 LEVELS;  Surgeon: Hosie Spangle, MD;  Location: [place] [facility];  Service: Neurosurgery;  Laterality: N/A;  Cervical three-four, four-five,five-six anterior cervical decompression with fusion plating and bonegraft  . BREAST SURGERY  2002   breast reduction  . CERVICAL CONE BIOPSY  20+yrs ago   d/t cervical cancer  . ECTOPIC PREGNANCY SURGERY  1987/1989  . right hand  90's   cyst removed from top of hand  . TUBAL LIGATION      Prior to Admission  medications   Medication Sig Start Date End Date Taking? Authorizing Provider  acetaminophen (TYLENOL) 500 MG tablet Take 1,000 mg by mouth every 6 (six) hours as needed for moderate pain.   Yes [provider]  albuterol (PROVENTIL HFA;VENTOLIN HFA) 108 (90 Base) MCG/ACT inhaler Inhale 2 puffs into the lungs every 6 (six) hours as needed. For shortness of breath Patient taking differently: Inhale 2 puffs into the lungs every 6 (six) hours as needed for wheezing or shortness of breath. For shortness of breath  09/01/16  Yes Lauree Chandler, NP  cyclobenzaprine (FLEXERIL) 10 MG tablet Take 1 tablet (10 mg total) by mouth 2 (two) times daily as needed for muscle spasms. 01/15/17  Yes Fredia Sorrow, MD  diphenhydrAMINE (BENADRYL) 25 mg capsule Take 25 mg by mouth every 6 (six) hours as needed for itching.   Yes [provider]  gabapentin (NEURONTIN) 300 MG capsule Take 300 mg by mouth 3 (three) times daily.    Yes [provider]  ibuprofen (ADVIL,MOTRIN) 200 MG tablet Take 400 mg by mouth every 6 (six) hours as needed.   Yes [provider]  lisinopril-hydrochlorothiazide (PRINZIDE,ZESTORETIC) 20-12.5 MG tablet Take 1 tablet by mouth daily.   Yes [provider]  LORazepam (ATIVAN) 0.5 MG tablet Take  0.5 mg by mouth at bedtime as needed for sleep. Stop Date: 09/07/16    Yes [provider]  Multiple Vitamin (MULITIVITAMIN WITH MINERALS) TABS Take 1 tablet by mouth daily.   Yes [provider]  naproxen (NAPROSYN) 500 MG tablet Take 500 mg by mouth 2 (two) times daily with a meal.   Yes [provider]  naproxen sodium (ANAPROX) 220 MG tablet Take 440 mg by mouth 2 (two) times daily as needed (pain).   Yes [provider]  Omega-3 1000 MG CAPS Take 1,000 mg by mouth 2 (two) times daily.    Yes [provider]  pantoprazole (PROTONIX) 40 MG tablet Take 40 mg by mouth at bedtime.   Yes [provider]   spironolactone (ALDACTONE) 50 MG tablet Take 50 mg by mouth daily.   Yes [provider]  nitroGLYCERIN (NITROSTAT) 0.4 MG SL tablet Place 1 tablet (0.4 mg total) under the tongue every 5 (five) minutes as needed. For chest pain Patient taking differently: Place 0.4 mg under the tongue every 5 (five) minutes as needed for chest pain. For chest pain  09/01/16   Lauree Chandler, NP    Allergies as of 03/23/2017 - Review Complete 03/23/2017  Allergen Reaction Noted  . Codeine Nausea And Vomiting 11/19/2011    Family History  Problem Relation Age of Onset  . Arthritis Father   . Cancer Father   . Alcoholism Mother   . Hypertension Mother   . Aneurysm Sister   . Anemia Daughter   . Anesthesia problems Neg Hx   . Hypotension Neg Hx   . Malignant hyperthermia Neg Hx   . Pseudochol deficiency Neg Hx   . Colon cancer Neg Hx   . Colon polyps Neg Hx     Social History   Social History  . Marital status: Married    Spouse name: N/A  . Number of children: N/A  . Years of education: N/A   Occupational History  . disability    Social History Main Topics  . Smoking status: Current Every Day Smoker    Packs/day: 1.00    Years: 32.00    Types: Cigarettes  . Smokeless tobacco: Never Used  . Alcohol use No     Comment: quit drinking September 2017. Used to drink 3-4 40s a day   . Drug use: Yes    Types: Marijuana     Comment: pt states she was told to use this for weight gain   . Sexual activity: Yes    Birth control/ protection: Surgical   Other Topics Concern  . Not on file   Social History Narrative  . No narrative on file    Review of Systems: See HPI, otherwise negative ROS  Physical Exam: BP 131/79   Pulse 88   Temp 98.5 F (36.9 C) (Oral)   Resp 14   Ht 5' (1.524 m)   Wt 117 lb (53.1 kg)   SpO2 98%   BMI 22.85 kg/m  General:    pleasant and cooperative in NAD Neck:  Supple; no masses or thyromegaly. No significant cervical adenopathy. Lungs:   Clear throughout to auscultation.   No wheezes, crackles, or rhonchi. No acute distress. Heart:  Regular rate and rhythm; no murmurs, clicks, rubs,  or gallops. Abdomen: Non-distended, normal bowel sounds.  Soft and nontender without appreciable mass or hepatosplenomegaly.  Pulses:  Normal pulses noted. Extremities:  Without clubbing or edema.  Impression:  Pleasant 52 year old lady with  esophageal dysphagia and intermittent hematochezia. EGD colonoscopy now be done to further evaluate her symptoms. The risks, benefits, limitations, imponderables and alternatives regarding both EGD and colonoscopy have been reviewed with the patient. Questions have been answered. All parties agreeable.      Notice: This dictation was prepared with Dragon dictation along with smaller phrase technology. Any transcriptional errors that result from this process are unintentional and may not be corrected upon review.

## 2017-04-27 NOTE — Discharge Instructions (Addendum)
Anal Stricture Anal stricture, also called anal stenosis, is a narrowing of the opening between the buttocks (anus). This condition makes bowel movements difficult or painful. Anal stricture can range from mild to severe. What are the causes? This condition may be caused by:  Surgery involving the anal canal, such as hemorrhoidectomy.  Abscesses or infections.  Inflammatory bowel disease (IBD), such as Crohn disease and ulcerative colitis.  Injury to the anus.  A tear or crack in the skin around the anus (anal fissure).  Radiation therapy.  An STD (sexually transmitted disease).  Tuberculosis.  Overuse of medicines that help you have a bowel movement (laxatives).  What are the signs or symptoms? Symptoms of this condition include:  Pain and pressure during bowel movements.  Bleeding during bowel movements.  Trouble getting stool out.  Constipation.  Discomfort in the anus.  Narrow stools.  How is this diagnosed? This condition may be diagnosed based on:  Your medical history.  An exam of your anal canal. During the exam, your health care provider will feel the inside of your rectum.  A test called an anorectal manometry. During this test a small balloon attached to a flexible tube is used to check how your anus is working.  How is this treated? Treatment for this condition depends on the cause and severity of the condition. For mild to moderate stricture, nonsurgical treatments are usually successful. Treatments for this condition may involve:  Diet changes. You may need to drink more fluid, eat more high-fiber foods, or take fiber supplements.  Stool softeners.  Techniques to stretch the opening of the anus (dilation techniques). These may be performed by a health care provider, or you may need to do them at home.  Surgery. This may be done in severe cases or if other treatments do not help.Surgerymay involve: ? Sphincterotomy. This is a surgery on a muscle  in the anus. ? Anoplasty. This is a surgery to reconstruct the anus.  Follow these instructions at home: Diet   Make any diet changes that your health care provider recommends.  Eat foods that are high in fiber, such as fruits, vegetables, whole grains, and beans.  Drink enough fluid to keep your urine clear or pale yellow. General instructions  Take over-the-counter and prescription medicines only as told by your health care provider.  Practice good hygiene to reduce your risk of infection.  Follow instructions from your health care provider about how and when to perform any dilation techniques.  Keep all follow-up visits as told by your health care provider. This is important. Contact a health care provider if:  You have more pain or bleeding during bowel movements, even after treatment.  You have trouble having a bowel movement.  You are unable to have a bowel movement. Get help right away if:  You have a fever and your symptoms suddenly get worse.  You have black stools. This information is not intended to replace advice given to you by your health care provider. Make sure you discuss any questions you have with your health care provider. Document Released: 12/25/2012 Document Revised: 04/26/2016 Document Reviewed: 03/02/2016 Elsevier Interactive Patient Education  2017 [place].  Colonoscopy Discharge Instructions  Read the instructions outlined below and refer to this sheet in the next few weeks. These discharge instructions provide you with general information on caring for yourself after you leave the hospital. Your doctor may also give you specific instructions. While your treatment has been planned according to the most  current medical practices available, unavoidable complications occasionally occur. If you have any problems or questions after discharge, call Dr. Gala Romney at [phone]. ACTIVITY  You may resume your regular activity, but move at a slower pace for  the next 24 hours.   Take frequent rest periods for the next 24 hours.   Walking will help get rid of the air and reduce the bloated feeling in your belly (abdomen).   No driving for 24 hours (because of the medicine (anesthesia) used during the test).    Do not sign any important legal documents or operate any machinery for 24 hours (because of the anesthesia used during the test).  NUTRITION  Drink plenty of fluids.   You may resume your normal diet as instructed by your doctor.   Begin with a light meal and progress to your normal diet. Heavy or fried foods are harder to digest and may make you feel sick to your stomach (nauseated).   Avoid alcoholic beverages for 24 hours or as instructed.  MEDICATIONS  You may resume your normal medications unless your doctor tells you otherwise.  WHAT YOU CAN EXPECT TODAY  Some feelings of bloating in the abdomen.   Passage of more gas than usual.   Spotting of blood in your stool or on the toilet paper.  IF YOU HAD POLYPS REMOVED DURING THE COLONOSCOPY:  No aspirin products for 7 days or as instructed.   No alcohol for 7 days or as instructed.   Eat a soft diet for the next 24 hours.  FINDING OUT THE RESULTS OF YOUR TEST Not all test results are available during your visit. If your test results are not back during the visit, make an appointment with your caregiver to find out the results. Do not assume everything is normal if you have not heard from your caregiver or the medical facility. It is important for you to follow up on all of your test results.  SEEK IMMEDIATE MEDICAL ATTENTION IF:  You have more than a spotting of blood in your stool.   Your belly is swollen (abdominal distention).   You are nauseated or vomiting.   You have a temperature over 101.   You have abdominal pain or discomfort that is severe or gets worse throughout the day.    EGD Discharge instructions Please read the instructions outlined below and  refer to this sheet in the next few weeks. These discharge instructions provide you with general information on caring for yourself after you leave the hospital. Your doctor may also give you specific instructions. While your treatment has been planned according to the most current medical practices available, unavoidable complications occasionally occur. If you have any problems or questions after discharge, please call your doctor. ACTIVITY  You may resume your regular activity but move at a slower pace for the next 24 hours.   Take frequent rest periods for the next 24 hours.   Walking will help expel (get rid of) the air and reduce the bloated feeling in your abdomen.   No driving for 24 hours (because of the anesthesia (medicine) used during the test).   You may shower.   Do not sign any important legal documents or operate any machinery for 24 hours (because of the anesthesia used during the test).  NUTRITION  Drink plenty of fluids.   You may resume your normal diet.   Begin with a light meal and progress to your normal diet.   Avoid alcoholic beverages for  24 hours or as instructed by your caregiver.  MEDICATIONS  You may resume your normal medications unless your caregiver tells you otherwise.  WHAT YOU CAN EXPECT TODAY  You may experience abdominal discomfort such as a feeling of fullness or gas pains.  FOLLOW-UP  Your doctor will discuss the results of your test with you.  SEEK IMMEDIATE MEDICAL ATTENTION IF ANY OF THE FOLLOWING OCCUR:  Excessive nausea (feeling sick to your stomach) and/or vomiting.   Severe abdominal pain and distention (swelling).   Trouble swallowing.   Temperature over 101 F (37.8 C).   Rectal bleeding or vomiting of blood.     Repeat EGD in 2 years to check for esophageal varices  Repeat colonoscopy for cancer screening in 10 years  Hemorrhoid information provided  Stop drinking alcohol entirely  Two-week course of Anusol  suppositories 1 per rectum at bedtime  Continue Protonix 40 mg daily  Office visit with [place] in 3 months

## 2017-04-27 NOTE — Transfer of Care (Signed)
Immediate Anesthesia Transfer of Care Note  Patient: Courtney Thornton  Procedure(s) Performed: Procedure(s) with comments: COLONOSCOPY WITH PROPOFOL (N/A) - 1:15pm ESOPHAGOGASTRODUODENOSCOPY (EGD) WITH PROPOFOL (N/A) MALONEY DILATION (N/A)  Patient Location: PACU  Anesthesia Type:MAC  Level of Consciousness: awake and patient cooperative  Airway & Oxygen Therapy: Patient Spontanous Breathing  Post-op Assessment: Report given to RN and Post -op Vital signs reviewed and stable  Post vital signs: Reviewed and stable  Last Vitals:  Vitals:   04/27/17 1310 04/27/17 1315  BP: 124/79 125/84  Pulse: 94   Resp: 16 16  Temp:    SpO2: 99% 99%    Last Pain:  Vitals:   04/27/17 1238  TempSrc: Oral  PainSc: 6       Patients Stated Pain Goal: 7 (67/73/73 [id number])  Complications: No apparent anesthesia complications

## 2017-04-27 NOTE — Anesthesia Postprocedure Evaluation (Signed)
Anesthesia Post Note  Patient: Courtney Thornton  Procedure(s) Performed: Procedure(s) (LRB): COLONOSCOPY WITH PROPOFOL (N/A) ESOPHAGOGASTRODUODENOSCOPY (EGD) WITH PROPOFOL (N/A) MALONEY DILATION (N/A)  Patient location during evaluation: PACU Anesthesia Type: MAC Level of consciousness: awake Pain management: pain level controlled Vital Signs Assessment: post-procedure vital signs reviewed and stable Respiratory status: spontaneous breathing, nonlabored ventilation and respiratory function stable Cardiovascular status: blood pressure returned to baseline Postop Assessment: no signs of nausea or vomiting Anesthetic complications: no     Last Vitals:  Vitals:   04/27/17 1315 04/27/17 1405  BP: 125/84 111/67  Pulse:    Resp: 16 20  Temp:  36.6 C  SpO2: 99% 100%    Last Pain:  Vitals:   04/27/17 1405  TempSrc:   PainSc: 0-No pain                 Garnetta Fedrick J

## 2017-04-27 NOTE — Anesthesia Preprocedure Evaluation (Signed)
Anesthesia Evaluation  Patient identified by MRN, date of birth, ID band Patient awake    Reviewed: Allergy & Precautions, H&P , NPO status , Patient's Chart, lab work & pertinent test results, reviewed documented beta blocker date and time   History of Anesthesia Complications (+) PONV and history of anesthetic complications  Airway Mallampati: II  TM Distance: >3 FB Neck ROM: Limited    Dental  (+) Teeth Intact, Dental Advisory Given   Pulmonary COPD, Current Smoker,    breath sounds clear to auscultation       Cardiovascular hypertension, Pt. on home beta blockers + CAD   Rhythm:Regular Rate:Normal     Neuro/Psych  Headaches, PSYCHIATRIC DISORDERS Anxiety Depression TIACVA    GI/Hepatic PUD, GERD  Medicated,(+) Cirrhosis     substance abuse  alcohol use,   Endo/Other    Renal/GU      Musculoskeletal   Abdominal   Peds  Hematology   Anesthesia Other Findings   Reproductive/Obstetrics                             Anesthesia Physical Anesthesia Plan  ASA: III  Anesthesia Plan: MAC   Post-op Pain Management:    Induction: Intravenous  PONV Risk Score and Plan:   Airway Management Planned: Simple Face Mask  Additional Equipment:   Intra-op Plan:   Post-operative Plan:   Informed Consent: I have reviewed the patients History and Physical, chart, labs and discussed the procedure including the risks, benefits and alternatives for the proposed anesthesia with the patient or authorized representative who has indicated his/her understanding and acceptance.     Plan Discussed with:   Anesthesia Plan Comments:         Anesthesia Quick Evaluation

## 2017-04-27 NOTE — Anesthesia Procedure Notes (Signed)
Procedure Name: MAC Date/Time: 04/27/2017 1:18 PM Performed by: Andree Elk, AMY A Pre-anesthesia Checklist: Patient identified, Emergency Drugs available, Suction available, Patient being monitored and Timeout performed Oxygen Delivery Method: Simple face mask

## 2017-04-27 NOTE — Op Note (Signed)
[facility] Patient Name: Janeal Abadi Procedure Date: 04/27/2017 1:34 PM MRN: [id number] Date of Birth: 10/04/1965 Attending MD: Norvel Richards , MD CSN: [id number] Age: 51 Admit Type: Outpatient Procedure:                Colonoscopy Indications:              Hematochezia Providers:                Norvel Richards, MD, Gwenlyn Fudge RN, RN,                            Aram Candela Referring MD:              Medicines:                Propofol per Anesthesia Complications:            No immediate complications. Estimated Blood Loss:     Estimated blood loss was minimal. Procedure:                Pre-Anesthesia Assessment:                           - Prior to the procedure, a History and Physical                            was performed, and patient medications and                            allergies were reviewed. The patient's tolerance of                            previous anesthesia was also reviewed. The risks                            and benefits of the procedure and the sedation                            options and risks were discussed with the patient.                            All questions were answered, and informed consent                            was obtained. Prior Anticoagulants: The patient has                            taken no previous anticoagulant or antiplatelet                            agents. ASA Grade Assessment: III - A patient with                            severe systemic disease. After reviewing the risks  and benefits, the patient was deemed in                            satisfactory condition to undergo the procedure.                           After obtaining informed consent, the colonoscope                            was passed under direct vision. Throughout the                            procedure, the patient's blood pressure, pulse, and                            oxygen saturations were  monitored continuously. The                            [id number]) was introduced through the and                            advanced to the the cecum, identified by                            appendiceal orifice and ileocecal valve. The                            ileocecal valve, appendiceal orifice, and rectum                            were photographed. The colonoscopy was performed                            without difficulty. The patient tolerated the                            procedure well. The quality of the bowel                            preparation was adequate. Scope In: 1:41:32 PM Scope Out: 1:57:31 PM Scope Withdrawal Time: 0 hours 6 minutes 23 seconds  Total Procedure Duration: 0 hours 15 minutes 59 seconds  Findings:      The perianal and digital rectal examinations were normal.      Internal hemorrhoids were found during retroflexion. Diffusely congested       colonic mucosa.      The exam was otherwise without abnormality on direct and retroflexion       views. Impression:               - Congested mucosa likely secondary to cirrhosis.                           - Internal hemorrhoids.                           -  The examination was otherwise normal on direct                            and retroflexion views.                           - No specimens collected. Moderate Sedation:      Moderate (conscious) sedation was personally administered by an       anesthesia professional. The following parameters were monitored: oxygen       saturation, heart rate, blood pressure, respiratory rate, EKG, adequacy       of pulmonary ventilation, and response to care. Total physician       intraservice time was 20 minutes. Recommendation:           - Patient has a contact number available for                            emergencies. The signs and symptoms of potential                            delayed complications were discussed with the                             patient. Return to normal activities tomorrow.                            Written discharge instructions were provided to the                            patient.                           - Resume previous diet. 2 week course of Anusol                            suppositories 1 per rectum at bedtime. Complete                            abstinence from alcohol recommended. See EGD report.                           - Continue present medications.                           - Repeat colonoscopy in 10 years for screening                            purposes.                           - Return to GI clinic in 3 months. Procedure Code(s):        --- Professional ---                           [phone], Colonoscopy, flexible; diagnostic, including  collection of specimen(s) by brushing or washing,                            when performed (separate procedure) Diagnosis Code(s):        --- Professional ---                           K64.8, Other hemorrhoids                           K92.1, Melena (includes Hematochezia) CPT copyright 2016 American Medical Association. All rights reserved. The codes documented in this report are preliminary and upon coder review may  be revised to meet current compliance requirements. Cristopher Estimable. Jaylenn Baiza, MD Norvel Richards, MD 04/27/2017 2:10:04 PM This report has been signed electronically. Number of Addenda: 0

## 2017-04-27 NOTE — Op Note (Signed)
[facility] Patient Name: Courtney Thornton Procedure Date: 04/27/2017 12:55 PM MRN: [id number] Date of Birth: 1966-01-15 Attending MD: Norvel Richards , MD CSN: [id number] Age: 51 Admit Type: Outpatient Procedure:                Upper GI endoscopy Indications:              Dysphagia Providers:                Norvel Richards, MD, Jeanann Lewandowsky. Sharon Seller, RN,                            Aram Candela Referring MD:              Medicines:                Midazolam mg IV Complications:            No immediate complications. Estimated Blood Loss:     Estimated blood loss: none. Procedure:                Pre-Anesthesia Assessment:                           - Prior to the procedure, a History and Physical                            was performed, and patient medications and                            allergies were reviewed. The patient's tolerance of                            previous anesthesia was also reviewed. The risks                            and benefits of the procedure and the sedation                            options and risks were discussed with the patient.                            All questions were answered, and informed consent                            was obtained. Prior Anticoagulants: The patient has                            taken no previous anticoagulant or antiplatelet                            agents. ASA Grade Assessment: III - A patient with                            severe systemic disease. After reviewing the risks  and benefits, the patient was deemed in                            satisfactory condition to undergo the procedure.                           After obtaining informed consent, the endoscope was                            passed under direct vision. Throughout the                            procedure, the patient's blood pressure, pulse, and                            oxygen saturations were monitored  continuously. The                            EG-[id number] ([id number]) scope was introduced through the                            mouth, and advanced to the second part of duodenum.                            The upper GI endoscopy was accomplished without                            difficulty. The patient tolerated the procedure                            well. The upper GI endoscopy was accomplished                            without difficulty. The patient tolerated the                            procedure well. Scope In: 1:27:15 PM Scope Out: 1:34:48 PM Total Procedure Duration: 0 hours 7 minutes 33 seconds  Findings:      The examined esophagus was normal. The scope was withdrawn. Dilation was       performed with a Maloney dilator with mild resistance at 52 Fr. The       dilation site was examined following endoscope reinsertion and showed no       change. Estimated blood loss: none.      A small hiatal hernia was present.      Portal hypertensive gastropathy was found in the entire examined stomach.      The duodenal bulb and second portion of the duodenum were normal. Impression:               - Normal esophagus. Dilated.                           - Small hiatal hernia.                           -  Portal hypertensive gastropathy.                           - Normal duodenal bulb and second portion of the                            duodenum.                           - No specimens collected. Moderate Sedation:      Moderate (conscious) sedation was personally administered by an       anesthesia professional. The following parameters were monitored: oxygen       saturation, heart rate, blood pressure, respiratory rate, EKG, adequacy       of pulmonary ventilation, and response to care. Total physician       intraservice time was 13 minutes. Recommendation:           - Patient has a contact number available for                            emergencies. The signs and symptoms of potential                             delayed complications were discussed with the                            patient. Return to normal activities tomorrow.                            Written discharge instructions were provided to the                            patient.                           - Resume previous diet.                           - Continue present medications.                           - Repeat upper endoscopy in 2 years for                            surveillance.                           - Return to GI clinic in 3 months. Procedure Code(s):        --- Professional ---                           [phone], Esophagogastroduodenoscopy, flexible,                            transoral; diagnostic, including collection of  specimen(s) by brushing or washing, when performed                            (separate procedure)                           43450, Dilation of esophagus, by unguided sound or                            bougie, single or multiple passes Diagnosis Code(s):        --- Professional ---                           K44.9, Diaphragmatic hernia without obstruction or                            gangrene                           K76.6, Portal hypertension                           K31.89, Other diseases of stomach and duodenum                           R13.10, Dysphagia, unspecified CPT copyright 2016 American Medical Association. All rights reserved. The codes documented in this report are preliminary and upon coder review may  be revised to meet current compliance requirements. Cristopher Estimable. Florenda Watt, MD Norvel Richards, MD 04/27/2017 4:35:20 PM This report has been signed electronically. Number of Addenda: 0

## 2017-04-28 ENCOUNTER — Encounter: Payer: Self-pay | Admitting: Obstetrics & Gynecology

## 2017-04-28 ENCOUNTER — Telehealth: Payer: Self-pay

## 2017-04-28 ENCOUNTER — Encounter: Payer: Self-pay | Admitting: Orthopaedic Surgery

## 2017-04-28 ENCOUNTER — Ambulatory Visit (INDEPENDENT_AMBULATORY_CARE_PROVIDER_SITE_OTHER): Payer: Medicare HMO | Admitting: Orthopaedic Surgery

## 2017-04-28 VITALS — BP 123/79 | HR 87 | Temp 98.2°F | Ht 60.0 in | Wt 116.0 lb

## 2017-04-28 DIAGNOSIS — G8929 Other chronic pain: Secondary | ICD-10-CM

## 2017-04-28 DIAGNOSIS — F17210 Nicotine dependence, cigarettes, uncomplicated: Secondary | ICD-10-CM | POA: Diagnosis not present

## 2017-04-28 DIAGNOSIS — M25511 Pain in right shoulder: Secondary | ICD-10-CM

## 2017-04-28 MED ORDER — CYCLOBENZAPRINE HCL 10 MG PO TABS: 10.0000 mg | ORAL_TABLET | Freq: Every day | ORAL | 1 refills | Status: DC

## 2017-04-28 NOTE — Patient Instructions (Signed)
Steps to Quit Smoking Smoking tobacco can be bad for your health. It can also affect almost every organ in your body. Smoking puts you and people around you at risk for many serious long-lasting (chronic) diseases. Quitting smoking is hard, but it is one of the best things that you can do for your health. It is never too late to quit. What are the benefits of quitting smoking? When you quit smoking, you lower your risk for getting serious diseases and conditions. They can include:  Lung cancer or lung disease.  Heart disease.  Stroke.  Heart attack.  Not being able to have children (infertility).  Weak bones (osteoporosis) and broken bones (fractures).  If you have coughing, wheezing, and shortness of breath, those symptoms may get better when you quit. You may also get sick less often. If you are pregnant, quitting smoking can help to lower your chances of having a baby of low birth weight. What can I do to help me quit smoking? Talk with your doctor about what can help you quit smoking. Some things you can do (strategies) include:  Quitting smoking totally, instead of slowly cutting back how much you smoke over a period of time.  Going to in-person counseling. You are more likely to quit if you go to many counseling sessions.  Using resources and support systems, such as: ? Online chats with a counselor. ? Phone quitlines. ? Printed self-help materials. ? Support groups or group counseling. ? Text messaging programs. ? Mobile phone apps or applications.  Taking medicines. Some of these medicines may have nicotine in them. If you are pregnant or breastfeeding, do not take any medicines to quit smoking unless your doctor says it is okay. Talk with your doctor about counseling or other things that can help you.  Talk with your doctor about using more than one strategy at the same time, such as taking medicines while you are also going to in-person counseling. This can help make  quitting easier. What things can I do to make it easier to quit? Quitting smoking might feel very hard at first, but there is a lot that you can do to make it easier. Take these steps:  Talk to your family and friends. Ask them to support and encourage you.  Call phone quitlines, reach out to support groups, or work with a counselor.  Ask people who smoke to not smoke around you.  Avoid places that make you want (trigger) to smoke, such as: ? Bars. ? Parties. ? Smoke-break areas at work.  Spend time with people who do not smoke.  Lower the stress in your life. Stress can make you want to smoke. Try these things to help your stress: ? Getting regular exercise. ? Deep-breathing exercises. ? Yoga. ? Meditating. ? Doing a body scan. To do this, close your eyes, focus on one area of your body at a time from head to toe, and notice which parts of your body are tense. Try to relax the muscles in those areas.  Download or buy apps on your mobile phone or tablet that can help you stick to your quit plan. There are many free apps, such as QuitGuide from the CDC (Centers for Disease Control and Prevention). You can find more support from smokefree.gov and other websites.  This information is not intended to replace advice given to you by your health care provider. Make sure you discuss any questions you have with your health care provider. Document Released: 06/26/2009 Document   Revised: 04/27/2016 Document Reviewed: 01/14/2015 Elsevier Interactive Patient Education  2018 Elsevier Inc.  

## 2017-04-28 NOTE — Telephone Encounter (Signed)
pts insurance does not cover the anusol suppositories. I tried to find patients formulary online and was unable to get it to pull up.  Can we send in a cream and see if it is covered?

## 2017-04-28 NOTE — Progress Notes (Signed)
Patient Courtney Thornton, female DOB:1966-04-25, 51 y.o. HUD:[id number]  Chief Complaint  Patient presents with  . Follow-up    RIGHT SHOULDER MRI REVIEW    HPI  Courtney Thornton is a 51 y.o. female who has pain of the right shoulder that is not getting better.  MRI was done which showed: IMPRESSION: 1. Large full-thickness tears of the infraspinatus and supraspinous tendons as described. 2. Slight arthritic changes of the glenohumeral joint. 3. Intrasubstance tear of the long head of the biceps tendon at the level of the bicipital groove. 4. Glenohumeral joint effusion with debris in the joint.  I have told her of the findings. I will have her see Dr. Aline Brochure.  She is agreeable to this. HPI  Body mass index is 22.65 kg/m.  ROS  Review of Systems  HENT: Negative for congestion.   Respiratory: Positive for shortness of breath. Negative for cough.   Cardiovascular: Negative for chest pain and leg swelling.  Endocrine: Positive for cold intolerance.  Musculoskeletal: Positive for neck pain. Negative for arthralgias and gait problem.  Allergic/Immunologic: Positive for environmental allergies.  Neurological: Positive for headaches.  Psychiatric/Behavioral: The patient is nervous/anxious.     Past Medical History:  Diagnosis Date  . Alcohol dependence ([place])   . Anxiety    takes Xanax daily  . Arthritis    shoulders  . Blood transfusion 2004  . Bruises easily    platelets are always high  . Cancer ([place])    cervical  . COPD (chronic obstructive pulmonary disease) ([place])   . Depression    takes Zoloft nightly  . Dizziness    hx of  . Emphysema   . Gastric ulcer    NO PRIOR EGD.   Marland Kitchen GERD (gastroesophageal reflux disease)    takes Omeprazole and Ranitidine daily  . Headache(784.0)   . Heart disease   . History of kidney stones 2000  . Hyperlipidemia    doesn't take any meds at present time  . Hypertension    takes Metoprolol and Lisinopril occ  .  Insomnia    xanax and pain pills help  . Liver disease   . Migraines    last on e3/10/13  . Multiple sclerosis ([place])   . Neck pain    stenosis and radiculopathy  . PONV (postoperative nausea and vomiting)   . Stroke [facility])    TIA-22 yrs ago    Past Surgical History:  Procedure Laterality Date  . ABDOMINAL HYSTERECTOMY  2004   partial d/t fibroid tumors  . ANTERIOR CERVICAL DECOMP/DISCECTOMY FUSION  11/24/2011   Procedure: ANTERIOR CERVICAL DECOMPRESSION/DISCECTOMY FUSION 3 LEVELS;  Surgeon: Hosie Spangle, MD;  Location: [facility];  Service: Neurosurgery;  Laterality: N/A;  Cervical three-four, four-five,five-six anterior cervical decompression with fusion plating and bonegraft  . BREAST SURGERY  2002   breast reduction  . CERVICAL CONE BIOPSY  20+yrs ago   d/t cervical cancer  . ECTOPIC PREGNANCY SURGERY  1987/1989  . right hand  90's   cyst removed from top of hand  . TUBAL LIGATION      Family History  Problem Relation Age of Onset  . Arthritis Father   . Cancer Father   . Alcoholism Mother   . Hypertension Mother   . Aneurysm Sister   . Anemia Daughter   . Anesthesia problems Neg Hx   . Hypotension Neg Hx   . Malignant hyperthermia Neg Hx   . Pseudochol deficiency Neg Hx   .  Colon cancer Neg Hx   . Colon polyps Neg Hx     Social History Social History  Substance Use Topics  . Smoking status: Current Every Day Smoker    Packs/day: 1.00    Years: 32.00    Types: Cigarettes  . Smokeless tobacco: Never Used  . Alcohol use No     Comment: quit drinking September 2017. Used to drink 3-4 40s a day     Allergies  Allergen Reactions  . Codeine Nausea And Vomiting  . Tomato Rash    Current Outpatient Prescriptions  Medication Sig Dispense Refill  . acetaminophen (TYLENOL) 500 MG tablet Take 1,000 mg by mouth every 6 (six) hours as needed for moderate pain.    Marland Kitchen albuterol (PROVENTIL HFA;VENTOLIN HFA) 108 (90 Base) MCG/ACT inhaler Inhale 2 puffs into the  lungs every 6 (six) hours as needed. For shortness of breath (Patient taking differently: Inhale 2 puffs into the lungs every 6 (six) hours as needed for wheezing or shortness of breath. For shortness of breath ) 8 g 0  . cyclobenzaprine (FLEXERIL) 10 MG tablet Take 1 tablet (10 mg total) by mouth at bedtime. 30 tablet 1  . diphenhydrAMINE (BENADRYL) 25 mg capsule Take 25 mg by mouth every 6 (six) hours as needed for itching.    . gabapentin (NEURONTIN) 300 MG capsule Take 300 mg by mouth 3 (three) times daily.     Marland Kitchen ibuprofen (ADVIL,MOTRIN) 200 MG tablet Take 400 mg by mouth every 6 (six) hours as needed.    Marland Kitchen lisinopril-hydrochlorothiazide (PRINZIDE,ZESTORETIC) 20-12.5 MG tablet Take 1 tablet by mouth daily.    Marland Kitchen LORazepam (ATIVAN) 0.5 MG tablet Take 0.5 mg by mouth at bedtime as needed for sleep. Stop Date: 09/07/16     . Multiple Vitamin (MULITIVITAMIN WITH MINERALS) TABS Take 1 tablet by mouth daily.    . naproxen (NAPROSYN) 500 MG tablet Take 500 mg by mouth 2 (two) times daily with a meal.    . naproxen sodium (ANAPROX) 220 MG tablet Take 440 mg by mouth 2 (two) times daily as needed (pain).    . nitroGLYCERIN (NITROSTAT) 0.4 MG SL tablet Place 1 tablet (0.4 mg total) under the tongue every 5 (five) minutes as needed. For chest pain (Patient taking differently: Place 0.4 mg under the tongue every 5 (five) minutes as needed for chest pain. For chest pain ) 30 tablet 0  . Omega-3 1000 MG CAPS Take 1,000 mg by mouth 2 (two) times daily.     . pantoprazole (PROTONIX) 40 MG tablet Take 40 mg by mouth at bedtime.    Marland Kitchen spironolactone (ALDACTONE) 50 MG tablet Take 50 mg by mouth daily.     No current facility-administered medications for this visit.      Physical Exam  Blood pressure 123/79, pulse 87, temperature 98.2 F (36.8 C), height 5' (1.524 m), weight 116 lb (52.6 kg).  Constitutional: overall normal hygiene, normal nutrition, well developed, normal grooming, normal body  habitus. Assistive device:none  Musculoskeletal: gait and station Limp none, muscle tone and strength are normal, no tremors or atrophy is present.  .  Neurological: coordination overall normal.  Deep tendon reflex/nerve stretch intact.  Sensation normal.  Cranial nerves II-XII intact.   Skin:   Normal overall no scars, lesions, ulcers or rashes. No psoriasis.  Psychiatric: Alert and oriented x 3.  Recent memory intact, remote memory unclear.  Normal mood and affect. Well groomed.  Good eye contact.  Cardiovascular: overall no swelling, no  varicosities, no edema bilaterally, normal temperatures of the legs and arms, no clubbing, cyanosis and good capillary refill.  Lymphatic: palpation is normal.  Right shoulder is very painful to move.  She prefers not to have me examine it today since possible surgery will be involved. She has no redness, no effusion.  NV intact.  The patient has been educated about the nature of the problem(s) and counseled on treatment options.  The patient appeared to understand what I have discussed and is in agreement with it.  Encounter Diagnoses  Name Primary?  . Chronic right shoulder pain Yes  . Cigarette nicotine dependence without complication     PLAN Call if any problems.  Precautions discussed.  Continue current medications.   Return to clinic to see Dr. Aline Brochure for possible surgery of the right shoulder   She continues to smoke and I have told her to try to cut way back.  Electronically Signed Sanjuana Kava, MD 8/16/201811:18 AM

## 2017-04-28 NOTE — Telephone Encounter (Signed)
Yes, let's substitute cream.

## 2017-04-29 NOTE — Telephone Encounter (Signed)
rx for cream has been called in to the pharmacy.

## 2017-05-11 ENCOUNTER — Ambulatory Visit: Payer: Medicare HMO | Admitting: Orthopedic Surgery

## 2017-05-30 ENCOUNTER — Encounter: Payer: Medicare HMO | Admitting: Obstetrics & Gynecology

## 2017-06-01 ENCOUNTER — Ambulatory Visit: Payer: Medicare HMO | Admitting: Orthopedic Surgery

## 2017-06-03 ENCOUNTER — Ambulatory Visit: Payer: Medicare HMO | Admitting: Orthopedic Surgery

## 2017-06-13 ENCOUNTER — Encounter: Payer: Medicare HMO | Admitting: Obstetrics & Gynecology

## 2017-06-13 ENCOUNTER — Ambulatory Visit: Payer: Medicare HMO | Admitting: Orthopedic Surgery

## 2017-06-15 ENCOUNTER — Ambulatory Visit: Payer: Medicare HMO | Admitting: Orthopedic Surgery

## 2017-06-17 ENCOUNTER — Telehealth: Payer: Self-pay | Admitting: Internal Medicine

## 2017-06-17 NOTE — Telephone Encounter (Signed)
Letter mailed

## 2017-06-17 NOTE — Telephone Encounter (Signed)
RECALL FOR MRI LIVER °

## 2017-06-23 ENCOUNTER — Encounter (HOSPITAL_COMMUNITY): Payer: Self-pay | Admitting: Emergency Medicine

## 2017-06-23 ENCOUNTER — Emergency Department (HOSPITAL_COMMUNITY)
Admission: EM | Admit: 2017-06-23 | Discharge: 2017-06-23 | Disposition: A | Discharge status: Home / Self Care | Payer: Medicare HMO | Attending: Emergency Medicine | Admitting: Emergency Medicine

## 2017-06-23 ENCOUNTER — Emergency Department (HOSPITAL_COMMUNITY): Payer: Medicare HMO

## 2017-06-23 ENCOUNTER — Ambulatory Visit: Payer: Medicare HMO | Admitting: Gastroenterology

## 2017-06-23 DIAGNOSIS — J069 Acute upper respiratory infection, unspecified: Secondary | ICD-10-CM | POA: Diagnosis not present

## 2017-06-23 DIAGNOSIS — Z79899 Other long term (current) drug therapy: Secondary | ICD-10-CM | POA: Insufficient documentation

## 2017-06-23 DIAGNOSIS — F17210 Nicotine dependence, cigarettes, uncomplicated: Secondary | ICD-10-CM | POA: Insufficient documentation

## 2017-06-23 DIAGNOSIS — R059 Cough, unspecified: Secondary | ICD-10-CM

## 2017-06-23 DIAGNOSIS — I10 Essential (primary) hypertension: Secondary | ICD-10-CM | POA: Insufficient documentation

## 2017-06-23 DIAGNOSIS — J449 Chronic obstructive pulmonary disease, unspecified: Secondary | ICD-10-CM | POA: Diagnosis not present

## 2017-06-23 DIAGNOSIS — R040 Epistaxis: Secondary | ICD-10-CM | POA: Diagnosis present

## 2017-06-23 DIAGNOSIS — R05 Cough: Secondary | ICD-10-CM | POA: Insufficient documentation

## 2017-06-23 LAB — BASIC METABOLIC PANEL
ANION GAP: 8 (ref 5–15)
BUN: 8 mg/dL (ref 6–20)
CO2: 28 mmol/L (ref 22–32)
Calcium: 9.5 mg/dL (ref 8.9–10.3)
Chloride: 103 mmol/L (ref 101–111)
Creatinine, Ser: 0.51 mg/dL (ref 0.44–1.00)
Glucose, Bld: 109 mg/dL — ABNORMAL HIGH (ref 65–99)
POTASSIUM: 3.6 mmol/L (ref 3.5–5.1)
SODIUM: 139 mmol/L (ref 135–145)

## 2017-06-23 LAB — CBC WITH DIFFERENTIAL/PLATELET
BASOS ABS: 0 10*3/uL (ref 0.0–0.1)
BASOS PCT: 0 %
EOS ABS: 0.3 10*3/uL (ref 0.0–0.7)
EOS PCT: 3 %
HCT: 35.4 % — ABNORMAL LOW (ref 36.0–46.0)
HEMOGLOBIN: 11.4 g/dL — AB (ref 12.0–15.0)
Lymphocytes Relative: 48 %
Lymphs Abs: 4.4 10*3/uL — ABNORMAL HIGH (ref 0.7–4.0)
MCH: 29.2 pg (ref 26.0–34.0)
MCHC: 32.2 g/dL (ref 30.0–36.0)
MCV: 90.8 fL (ref 78.0–100.0)
Monocytes Absolute: 0.6 10*3/uL (ref 0.1–1.0)
Monocytes Relative: 6 %
NEUTROS PCT: 43 %
Neutro Abs: 4 10*3/uL (ref 1.7–7.7)
PLATELETS: 325 10*3/uL (ref 150–400)
RBC: 3.9 MIL/uL (ref 3.87–5.11)
RDW: 13.4 % (ref 11.5–15.5)
WBC: 9.3 10*3/uL (ref 4.0–10.5)

## 2017-06-23 LAB — I-STAT TROPONIN, ED: Troponin i, poc: 0 ng/mL (ref 0.00–0.08)

## 2017-06-23 MED ORDER — OXYMETAZOLINE HCL 0.05 % NA SOLN
1.0000 | Freq: Once | NASAL | Status: AC
  Administered 2017-06-23: 1 via NASAL
  Filled 2017-06-23: qty 15

## 2017-06-23 NOTE — ED Triage Notes (Signed)
Intermittent nose bleeding x 2 weeks, pt has been blowing her nose, then it will start again. Also at times feels dizzy and weak, Sometimes feels like the room is spinning. Seen PCP, was told it was her sinuses

## 2017-06-23 NOTE — Discharge Instructions (Signed)
For recurrent nose bleed, blow your nose, give yourself a spray of afrin, and hold deep pressure for 15 minutes. May repeat up to three times. If persistent bleeding, come to ED  Your work-up today is reassuring. You likely have a cold virus. Return for worsening symptoms, including fever, escalating pain, difficulty breathing, confusion or any other symptoms concerning to you

## 2017-06-23 NOTE — ED Provider Notes (Signed)
[name] Provider Note   CSN: [id number] Arrival date & time: 06/23/17  8938     History   Chief Complaint Chief Complaint  Patient presents with  . Epistaxis    HPI Courtney Thornton is a 51 y.o. female.  HPI 51 year old female who presents with multiple complaints. She has a history of anxiety, COPD, depression, hypertension and hyperlipidemia. She reports 2 weeks of intermittent epistaxis out of the left nares. Has been coughing, sneezing, with runny nose, and congestion. No fevers or chills. Has noted over the past 3 days that when she coughs she has a sharp pain in the left side of her chest that travels to the back. No significant dyspnea, syncope or near syncope, focal numbness or weakness. She takes a baby aspirin. No fall or trauma. Past Medical History:  Diagnosis Date  . Alcohol dependence ([place])   . Anxiety    takes Xanax daily  . Arthritis    shoulders  . Blood transfusion 2004  . Bruises easily    platelets are always high  . Cancer ([place])    cervical  . COPD (chronic obstructive pulmonary disease) ([place])   . Depression    takes Zoloft nightly  . Dizziness    hx of  . Emphysema   . Gastric ulcer    NO PRIOR EGD.   Marland Kitchen GERD (gastroesophageal reflux disease)    takes Omeprazole and Ranitidine daily  . Headache(784.0)   . Heart disease   . History of kidney stones 2000  . Hyperlipidemia    doesn't take any meds at present time  . Hypertension    takes Metoprolol and Lisinopril occ  . Insomnia    xanax and pain pills help  . Liver disease   . Migraines    last on e3/10/13  . Multiple sclerosis ([place])   . Neck pain    stenosis and radiculopathy  . PONV (postoperative nausea and vomiting)   . Stroke [facility])    TIA-22 yrs ago    Patient Active Problem List   Diagnosis Date Noted  . Hepatic cirrhosis ([place]) 03/23/2017  . Liver lesion 03/23/2017  . Dysphagia 03/23/2017  . Rectal bleeding 03/23/2017  . Adult failure to thrive 08/12/2016  .  Change in bowel movement 08/12/2016  . Subacute confusional state 08/12/2016  . Leukocytosis 08/06/2016  . Hypokalemia 07/31/2016  . Hyponatremia 07/31/2016  . Alcohol abuse 07/31/2016  . Anemia 07/31/2016  . Diarrhea 07/31/2016  . Protein-calorie malnutrition, severe ([place]) 07/31/2016  . HTN (hypertension) 07/31/2016  . GERD (gastroesophageal reflux disease) 07/31/2016    Past Surgical History:  Procedure Laterality Date  . ABDOMINAL HYSTERECTOMY  2004   partial d/t fibroid tumors  . ANTERIOR CERVICAL DECOMP/DISCECTOMY FUSION  11/24/2011   Procedure: ANTERIOR CERVICAL DECOMPRESSION/DISCECTOMY FUSION 3 LEVELS;  Surgeon: Hosie Spangle, MD;  Location: [facility];  Service: Neurosurgery;  Laterality: N/A;  Cervical three-four, four-five,five-six anterior cervical decompression with fusion plating and bonegraft  . BREAST SURGERY  2002   breast reduction  . CERVICAL CONE BIOPSY  20+yrs ago   d/t cervical cancer  . COLONOSCOPY WITH PROPOFOL N/A 04/27/2017   Procedure: COLONOSCOPY WITH PROPOFOL;  Surgeon: Daneil Dolin, MD;  Location[facility];  Service: Endoscopy;  Laterality: N/A;  1:15pm  . ECTOPIC PREGNANCY SURGERY  1987/1989  . ESOPHAGOGASTRODUODENOSCOPY (EGD) WITH PROPOFOL N/A 04/27/2017   Procedure: ESOPHAGOGASTRODUODENOSCOPY (EGD) WITH PROPOFOL;  Surgeon: Daneil Dolin, MD;  Location[facility];  Service: Endoscopy;  Laterality: N/A;  . Venia Minks DILATION N/A 04/27/2017   Procedure: Venia Minks DILATION;  Surgeon: Daneil Dolin, MD;  Location: [facility];  Service: Endoscopy;  Laterality: N/A;  . right hand  90's   cyst removed from top of hand  . TUBAL LIGATION      OB History    Gravida Para Term Preterm AB Living   1 1       1    SAB TAB Ectopic Multiple Live Births                   Home Medications    Prior to Admission medications   Medication Sig Start Date End Date Taking? Authorizing Provider  acetaminophen (TYLENOL) 500 MG tablet Take 1,000 mg by  mouth every 6 (six) hours as needed for moderate pain.   Yes [provider]  albuterol (PROVENTIL HFA;VENTOLIN HFA) 108 (90 Base) MCG/ACT inhaler Inhale 2 puffs into the lungs every 6 (six) hours as needed. For shortness of breath Patient taking differently: Inhale 2 puffs into the lungs every 6 (six) hours as needed for wheezing or shortness of breath. For shortness of breath  09/01/16  Yes Lauree Chandler, NP  cyclobenzaprine (FLEXERIL) 10 MG tablet Take 1 tablet (10 mg total) by mouth at bedtime. 04/28/17  Yes Sanjuana Kava, MD  diphenhydrAMINE (BENADRYL) 25 mg capsule Take 25 mg by mouth every 6 (six) hours as needed for itching.   Yes [provider]  gabapentin (NEURONTIN) 300 MG capsule Take 300 mg by mouth 3 (three) times daily.    Yes [provider]  ibuprofen (ADVIL,MOTRIN) 200 MG tablet Take 400 mg by mouth every 6 (six) hours as needed.   Yes [provider]  Multiple Vitamin (MULITIVITAMIN WITH MINERALS) TABS Take 1 tablet by mouth daily.   Yes [provider]  naproxen (NAPROSYN) 500 MG tablet Take 500 mg by mouth 2 (two) times daily with a meal.   Yes [provider]  naproxen sodium (ANAPROX) 220 MG tablet Take 440 mg by mouth 2 (two) times daily as needed (pain).   Yes [provider]  nitroGLYCERIN (NITROSTAT) 0.4 MG SL tablet Place 1 tablet (0.4 mg total) under the tongue every 5 (five) minutes as needed. For chest pain Patient taking differently: Place 0.4 mg under the tongue every 5 (five) minutes as needed for chest pain. For chest pain  09/01/16  Yes Eubanks, Janett Billow K, NP  Omega-3 1000 MG CAPS Take 1,000 mg by mouth 2 (two) times daily.    Yes [provider]  pantoprazole (PROTONIX) 40 MG tablet Take 40 mg by mouth at bedtime.   Yes [provider]  spironolactone (ALDACTONE) 50 MG tablet Take 50 mg by mouth daily.   Yes [provider]    Family History Family History  Problem  Relation Age of Onset  . Arthritis Father   . Cancer Father   . Alcoholism Mother   . Hypertension Mother   . Aneurysm Sister   . Anemia Daughter   . Anesthesia problems Neg Hx   . Hypotension Neg Hx   . Malignant hyperthermia Neg Hx   . Pseudochol deficiency Neg Hx   . Colon cancer Neg Hx   . Colon polyps Neg Hx     Social History Social History  Substance Use Topics  . Smoking status: Current Every Day Smoker    Packs/day: 1.00    Years: 32.00    Types: Cigarettes  .  Smokeless tobacco: Never Used  . Alcohol use No     Comment: quit drinking September 2017. Used to drink 3-4 40s a day      Allergies   Codeine and Tomato   Review of Systems Review of Systems  Constitutional: Negative for fever.  HENT: Positive for congestion.   Respiratory: Positive for cough.   All other systems reviewed and are negative.    Physical Exam Updated Vital Signs BP (!) 160/89   Pulse 84   Temp 98.1 F (36.7 C) (Oral)   Resp 18   Ht 5' (1.524 m)   Wt 54.9 kg (121 lb)   SpO2 100%   BMI 23.63 kg/m   Physical Exam Physical Exam  Nursing note and vitals reviewed. Constitutional: Well developed, well nourished, non-toxic, and in no acute distress Head: Normocephalic and atraumatic.  Mouth/Throat: Oropharynx is clear and moist.  Neck: Normal range of motion. Neck supple.  Nose: normal bilateral nares Cardiovascular: Normal rate and regular rhythm.   Pulmonary/Chest: Effort normal and breath sounds normal.  Abdominal: Soft. There is no tenderness. There is no rebound and no guarding.  Musculoskeletal: Normal range of motion.  Neurological: Alert, no facial droop, fluent speech, moves all extremities symmetrically Skin: Skin is warm and dry.  Psychiatric: Cooperative   ED Treatments / Results  Labs (all labs ordered are listed, but only abnormal results are displayed) Labs Reviewed  CBC WITH DIFFERENTIAL/PLATELET - Abnormal; Notable for the following:       Result Value    Hemoglobin 11.4 (*)    HCT 35.4 (*)    Lymphs Abs 4.4 (*)    All other components within normal limits  BASIC METABOLIC PANEL - Abnormal; Notable for the following:    Glucose, Bld 109 (*)    All other components within normal limits  I-STAT TROPONIN, ED    EKG  EKG Interpretation  Date/Time:  Thursday June 23 2017 10:54:24 EDT Ventricular Rate:  81 PR Interval:    QRS Duration: 87 QT Interval:  370 QTC Calculation: 430 R Axis:   50 Text Interpretation:  Sinus rhythm No acute changes Confirmed by Brantley Stage [phone]) on 06/23/2017 11:30:06 AM       Radiology Dg Chest 2 View  Result Date: 06/23/2017 CLINICAL DATA:  Cough, congestion EXAM: CHEST  2 VIEW COMPARISON:  01/15/2017 FINDINGS: Heart and mediastinal contours are within normal limits. No focal opacities or effusions. No acute bony abnormality. IMPRESSION: No active cardiopulmonary disease. Electronically Signed   By: Rolm Baptise M.D.   On: 06/23/2017 11:08    Procedures Procedures (including critical care time)  Medications Ordered in ED Medications  oxymetazoline (AFRIN) 0.05 % nasal spray 1 spray (1 spray Each Nare Given 06/23/17 1046)     Initial Impression / Assessment and Plan / ED Course  I have reviewed the triage vital signs and the nursing notes.  Pertinent labs & imaging results that were available during my care of the patient were reviewed by me and considered in my medical decision making (see chart for details).     51 year old female with recurrent epistaxis for 2 weeks. Currently without any epistaxis and nasal passages appear clear. Given a bottle of Afrin, and discussed supportive management for any recurrence. She will follow-up with PCP for recurrent symptoms or if she needs referral to ENT. This may be due to weather changes as well as recent URI symptoms.  Blood work overall reassuring without evidence of thrombocytopenia or anemia. Chest x-ray  is clear, without pneumonia or other  acute cardiopulmonary processes. The chest pain that she describes primarily only occurs with cough, and seems musculoskeletal. EKG and troponin negative, and low suspicion for any other serious cardiopulmonary or intrathoracic causes.  Strict return and follow-up instructions reviewed. She expressed understanding of all discharge instructions and felt comfortable with the plan of care.   Final Clinical Impressions(s) / ED Diagnoses   Final diagnoses:  Epistaxis  Cough  Viral upper respiratory tract infection    New Prescriptions New Prescriptions   No medications on file     Forde Dandy, MD 06/23/17 1146

## 2017-06-27 ENCOUNTER — Encounter: Payer: Medicare HMO | Admitting: Obstetrics & Gynecology

## 2017-07-08 ENCOUNTER — Encounter: Payer: Medicare HMO | Admitting: Obstetrics & Gynecology

## 2017-07-11 ENCOUNTER — Encounter (INDEPENDENT_AMBULATORY_CARE_PROVIDER_SITE_OTHER): Payer: Self-pay

## 2017-07-11 ENCOUNTER — Encounter: Payer: Self-pay | Admitting: Obstetrics & Gynecology

## 2017-07-11 ENCOUNTER — Other Ambulatory Visit (HOSPITAL_COMMUNITY)
Admission: RE | Admit: 2017-07-11 | Discharge: 2017-07-11 | Disposition: A | Discharge status: Home / Self Care | Payer: Medicare HMO | Source: Ambulatory Visit | Attending: Obstetrics & Gynecology | Admitting: Obstetrics & Gynecology

## 2017-07-11 ENCOUNTER — Ambulatory Visit (INDEPENDENT_AMBULATORY_CARE_PROVIDER_SITE_OTHER): Payer: Medicare HMO | Admitting: Obstetrics & Gynecology

## 2017-07-11 VITALS — BP 110/70 | HR 102 | Ht 60.0 in | Wt 122.0 lb

## 2017-07-11 DIAGNOSIS — Z1151 Encounter for screening for human papillomavirus (HPV): Secondary | ICD-10-CM | POA: Diagnosis not present

## 2017-07-11 DIAGNOSIS — Z8541 Personal history of malignant neoplasm of cervix uteri: Secondary | ICD-10-CM | POA: Diagnosis not present

## 2017-07-11 DIAGNOSIS — Z1272 Encounter for screening for malignant neoplasm of vagina: Secondary | ICD-10-CM

## 2017-07-11 NOTE — Progress Notes (Signed)
Chief Complaint  Patient presents with  . Gynecologic Exam    PAP only      51 y.o. [id number] No LMP recorded. Patient has had a hysterectomy. The current method of family planning is status post hysterectomy.  Outpatient Encounter Medications as of 07/11/2017  Medication Sig Note  . acetaminophen (TYLENOL) 500 MG tablet Take 1,000 mg by mouth every 6 (six) hours as needed for moderate pain.   Marland Kitchen albuterol (PROVENTIL HFA;VENTOLIN HFA) 108 (90 Base) MCG/ACT inhaler Inhale 2 puffs into the lungs every 6 (six) hours as needed. For shortness of breath (Patient taking differently: Inhale 2 puffs into the lungs every 6 (six) hours as needed for wheezing or shortness of breath. For shortness of breath )   . cyclobenzaprine (FLEXERIL) 10 MG tablet Take 1 tablet (10 mg total) by mouth at bedtime.   . diphenhydrAMINE (BENADRYL) 25 mg capsule Take 25 mg by mouth every 6 (six) hours as needed for itching.   . gabapentin (NEURONTIN) 300 MG capsule Take 300 mg by mouth 3 (three) times daily.    . Multiple Vitamin (MULITIVITAMIN WITH MINERALS) TABS Take 1 tablet by mouth daily.   . naproxen sodium (ANAPROX) 220 MG tablet Take 440 mg by mouth 2 (two) times daily as needed (pain).   . nitroGLYCERIN (NITROSTAT) 0.4 MG SL tablet Place 1 tablet (0.4 mg total) under the tongue every 5 (five) minutes as needed. For chest pain (Patient taking differently: Place 0.4 mg under the tongue every 5 (five) minutes as needed for chest pain. For chest pain )   . Omega-3 1000 MG CAPS Take 1,000 mg by mouth 2 (two) times daily.    . pantoprazole (PROTONIX) 40 MG tablet Take 40 mg by mouth at bedtime.   Marland Kitchen spironolactone (ALDACTONE) 50 MG tablet Take 50 mg by mouth daily. 04/20/2017: Has not had in a month  . [DISCONTINUED] ibuprofen (ADVIL,MOTRIN) 200 MG tablet Take 400 mg by mouth every 6 (six) hours as needed.   . naproxen (NAPROSYN) 500 MG tablet Take 500 mg by mouth 2 (two) times daily with a meal.    No  facility-administered encounter medications on file as of 07/11/2017.     Subjective Courtney Thornton is in today for a Pap smear She has a history of cervical cancer and is status post surgery and adjuvant therapy She is without any complaints today Specifically denies vaginal discharge or vaginal bleeding She does have some discomfort with intercourse related to insertion She does not typically use lubrication Past Medical History:  Diagnosis Date  . Alcohol dependence ([place])   . Anxiety    takes Xanax daily  . Arthritis    shoulders  . Blood transfusion 2004  . Bruises easily    platelets are always high  . Cancer ([place])    cervical  . COPD (chronic obstructive pulmonary disease) ([place])   . Depression    takes Zoloft nightly  . Dizziness    hx of  . Emphysema   . Gastric ulcer    NO PRIOR EGD.   Marland Kitchen GERD (gastroesophageal reflux disease)    takes Omeprazole and Ranitidine daily  . Headache(784.0)   . Heart disease   . History of kidney stones 2000  . Hyperlipidemia    doesn't take any meds at present time  . Hypertension    takes Metoprolol and Lisinopril occ  . Insomnia    xanax and pain pills help  . Liver disease   .  Migraines    last on e3/10/13  . Multiple sclerosis ([place])   . Neck pain    stenosis and radiculopathy  . PONV (postoperative nausea and vomiting)   . Stroke [facility])    TIA-22 yrs ago    Past Surgical History:  Procedure Laterality Date  . ABDOMINAL HYSTERECTOMY  2004   partial d/t fibroid tumors  . BREAST SURGERY  2002   breast reduction  . CERVICAL CONE BIOPSY  20+yrs ago   d/t cervical cancer  . ECTOPIC PREGNANCY SURGERY  1987/1989  . right hand  90's   cyst removed from top of hand  . TUBAL LIGATION      OB History    Gravida Para Term Preterm AB Living   3 1     2 1    SAB TAB Ectopic Multiple Live Births       2          Allergies  Allergen Reactions  . Codeine Nausea And Vomiting  . Tomato Rash    Social History    Socioeconomic History  . Marital status: Married    Spouse name: None  . Number of children: None  . Years of education: None  . Highest education level: None  Social Needs  . Financial resource strain: None  . Food insecurity - worry: None  . Food insecurity - inability: None  . Transportation needs - medical: None  . Transportation needs - non-medical: None  Occupational History  . Occupation: disability  Tobacco Use  . Smoking status: Current Every Day Smoker    Packs/day: 1.00    Years: 32.00    Pack years: 32.00    Types: Cigarettes  . Smokeless tobacco: Never Used  Substance and Sexual Activity  . Alcohol use: No    Alcohol/week: 0.6 oz    Types: 1 Cans of beer per week    Comment: quit drinking September 2017. Used to drink 3-4 40s a day   . Drug use: Yes    Types: Marijuana    Comment: pt states she was told to use this for weight gain   . Sexual activity: Yes    Birth control/protection: Surgical  Other Topics Concern  . None  Social History Narrative  . None    Family History  Problem Relation Age of Onset  . Arthritis Father   . Cancer Father   . Alcoholism Mother   . Hypertension Mother   . Aneurysm Sister   . Anemia Daughter   . Anesthesia problems Neg Hx   . Hypotension Neg Hx   . Malignant hyperthermia Neg Hx   . Pseudochol deficiency Neg Hx   . Colon cancer Neg Hx   . Colon polyps Neg Hx     Medications:       Current Outpatient Medications:  .  acetaminophen (TYLENOL) 500 MG tablet, Take 1,000 mg by mouth every 6 (six) hours as needed for moderate pain., Disp: , Rfl:  .  albuterol (PROVENTIL HFA;VENTOLIN HFA) 108 (90 Base) MCG/ACT inhaler, Inhale 2 puffs into the lungs every 6 (six) hours as needed. For shortness of breath (Patient taking differently: Inhale 2 puffs into the lungs every 6 (six) hours as needed for wheezing or shortness of breath. For shortness of breath ), Disp: 8 g, Rfl: 0 .  cyclobenzaprine (FLEXERIL) 10 MG tablet, Take  1 tablet (10 mg total) by mouth at bedtime., Disp: 30 tablet, Rfl: 1 .  diphenhydrAMINE (BENADRYL) 25 mg capsule, Take 25  mg by mouth every 6 (six) hours as needed for itching., Disp: , Rfl:  .  gabapentin (NEURONTIN) 300 MG capsule, Take 300 mg by mouth 3 (three) times daily. , Disp: , Rfl:  .  Multiple Vitamin (MULITIVITAMIN WITH MINERALS) TABS, Take 1 tablet by mouth daily., Disp: , Rfl:  .  naproxen sodium (ANAPROX) 220 MG tablet, Take 440 mg by mouth 2 (two) times daily as needed (pain)., Disp: , Rfl:  .  nitroGLYCERIN (NITROSTAT) 0.4 MG SL tablet, Place 1 tablet (0.4 mg total) under the tongue every 5 (five) minutes as needed. For chest pain (Patient taking differently: Place 0.4 mg under the tongue every 5 (five) minutes as needed for chest pain. For chest pain ), Disp: 30 tablet, Rfl: 0 .  Omega-3 1000 MG CAPS, Take 1,000 mg by mouth 2 (two) times daily. , Disp: , Rfl:  .  pantoprazole (PROTONIX) 40 MG tablet, Take 40 mg by mouth at bedtime., Disp: , Rfl:  .  spironolactone (ALDACTONE) 50 MG tablet, Take 50 mg by mouth daily., Disp: , Rfl:  .  ibuprofen (ADVIL,MOTRIN) 600 MG tablet, Take 1 tablet (600 mg total) 4 (four) times daily by mouth., Disp: 30 tablet, Rfl: 0 .  naproxen (NAPROSYN) 500 MG tablet, Take 500 mg by mouth 2 (two) times daily with a meal., Disp: , Rfl:  .  promethazine (PHENERGAN) 12.5 MG tablet, Take 1 tablet (12.5 mg total) every 6 (six) hours as needed by mouth for nausea or vomiting., Disp: 12 tablet, Rfl: 0 .  traMADol (ULTRAM) 50 MG tablet, Take 1 tablet (50 mg total) every 6 (six) hours as needed by mouth., Disp: 15 tablet, Rfl: 0  Objective Blood pressure 110/70, pulse (!) 102, height 5' (1.524 m), weight 122 lb (55.3 kg).  General WDWN female NAD Vulva:  normal appearing vulva with no masses, tenderness or lesions Vagina:  normal mucosa, no discharge, no  Lesions, cytologic evaluation of the vaginal cuff is performed because of history of cervical cancer Cervix:   absent Uterus:  uterus absent Adnexa: ovaries:present, not palpable   Pertinent ROS No burning with urination, frequency or urgency No nausea, vomiting or diarrhea Nor fever chills or other constitutional symptoms   Labs or studies none    Impression Diagnoses this Encounter::   ICD-10-CM   1. History of cervical cancer Z85.41 Cytology - PAP  2. Vaginal Pap smear Z12.72 Cytology - PAP    Established relevant diagnosis(es):   Plan/Recommendations: No orders of the defined types were placed in this encounter.   Labs or Scans Ordered: No orders of the defined types were placed in this encounter.   Management:: Patient is aware that she will still need yearly Pap smears to evaluate for vaginal recurrence of cervical cancer or primary vaginal cancer given her history of cervical cancer in the past  Follow up Return in about 1 year (around 07/11/2018) for yearly, with Dr Elonda Husky.    All questions were answered.

## 2017-07-12 LAB — CYTOLOGY - PAP
Chlamydia: NEGATIVE
DIAGNOSIS: NEGATIVE
HPV: NOT DETECTED
Neisseria Gonorrhea: NEGATIVE

## 2017-07-17 ENCOUNTER — Encounter (HOSPITAL_COMMUNITY): Payer: Self-pay | Admitting: Emergency Medicine

## 2017-07-17 ENCOUNTER — Encounter: Payer: Self-pay | Admitting: Obstetrics & Gynecology

## 2017-07-17 ENCOUNTER — Emergency Department (HOSPITAL_COMMUNITY)
Admission: EM | Admit: 2017-07-17 | Discharge: 2017-07-17 | Disposition: A | Discharge status: Home / Self Care | Payer: Medicare HMO | Attending: Emergency Medicine | Admitting: Emergency Medicine

## 2017-07-17 DIAGNOSIS — I10 Essential (primary) hypertension: Secondary | ICD-10-CM | POA: Insufficient documentation

## 2017-07-17 DIAGNOSIS — Z79899 Other long term (current) drug therapy: Secondary | ICD-10-CM | POA: Insufficient documentation

## 2017-07-17 DIAGNOSIS — M25552 Pain in left hip: Secondary | ICD-10-CM | POA: Insufficient documentation

## 2017-07-17 DIAGNOSIS — M5416 Radiculopathy, lumbar region: Secondary | ICD-10-CM | POA: Diagnosis not present

## 2017-07-17 DIAGNOSIS — J449 Chronic obstructive pulmonary disease, unspecified: Secondary | ICD-10-CM | POA: Diagnosis not present

## 2017-07-17 DIAGNOSIS — F17210 Nicotine dependence, cigarettes, uncomplicated: Secondary | ICD-10-CM | POA: Insufficient documentation

## 2017-07-17 MED ORDER — PROMETHAZINE HCL 12.5 MG PO TABS
12.5000 mg | ORAL_TABLET | Freq: Once | ORAL | Status: AC
  Administered 2017-07-17: 12.5 mg via ORAL
  Filled 2017-07-17: qty 1

## 2017-07-17 MED ORDER — IBUPROFEN 600 MG PO TABS
600.0000 mg | ORAL_TABLET | Freq: Four times a day (QID) | ORAL | 0 refills | Status: DC
Start: 2017-07-17 — End: 2017-08-18

## 2017-07-17 MED ORDER — PROMETHAZINE HCL 12.5 MG PO TABS: 12.5000 mg | ORAL_TABLET | Freq: Four times a day (QID) | ORAL | 0 refills | Status: DC | PRN

## 2017-07-17 MED ORDER — KETOROLAC TROMETHAMINE 10 MG PO TABS
10.0000 mg | ORAL_TABLET | Freq: Once | ORAL | Status: AC
  Administered 2017-07-17: 10 mg via ORAL
  Filled 2017-07-17: qty 1

## 2017-07-17 MED ORDER — TRAMADOL HCL 50 MG PO TABS
100.0000 mg | ORAL_TABLET | Freq: Once | ORAL | Status: AC
  Administered 2017-07-17: 100 mg via ORAL
  Filled 2017-07-17: qty 2

## 2017-07-17 MED ORDER — DEXAMETHASONE SODIUM PHOSPHATE 10 MG/ML IJ SOLN
10.0000 mg | Freq: Once | INTRAMUSCULAR | Status: AC
  Administered 2017-07-17: 10 mg via INTRAMUSCULAR
  Filled 2017-07-17: qty 1

## 2017-07-17 MED ORDER — TRAMADOL HCL 50 MG PO TABS: 50.0000 mg | ORAL_TABLET | Freq: Four times a day (QID) | ORAL | 0 refills | Status: AC | PRN

## 2017-07-17 NOTE — Discharge Instructions (Signed)
Please use a heating pad to your back and hip.  Please rest your back and hip as much as possible.  Please call Dr. Luna Glasgow for an appointment as soon as possible in the office concerning both your back and your hip.

## 2017-07-17 NOTE — ED Triage Notes (Addendum)
Patient c/o left hip pain that radiates into buttock and down leg x2 days. Denies any injury. Per patient treated 4 months ago at Dr Briscoe Burns office with steroid injection for same pain. Denies any swelling in leg. Patient reports taking ibuprofen, tylenol, and aleve with no relief.

## 2017-07-17 NOTE — ED Provider Notes (Signed)
[facility] EMERGENCY DEPARTMENT Provider Note   CSN: [id number] Arrival date & time: 07/17/17  1720     History   Chief Complaint Chief Complaint  Patient presents with  . Hip Pain    HPI Courtney Thornton is a 51 y.o. female.  Patient is a 51 year old female who presents to the emergency department with left hip and lower back pain.  The patient states she has had problems with her lower back in the past as well as some pain and problems with her left hip.  She has been seen by Dr. Luna Glasgow for orthopedic management.  Approximately 4 months ago she received an injection in the hip, and she stated her hip and back area felt much better for about 3-4 months.  In the last 3-4 days she states the pain is getting unbearable.  The pain is worse with attempted range of motion of her left hip.  She also has pain however with certain motions involving her lower back.  She has not had any loss of control of bowel or bladder.  She has not had any numbness or tingling in the saddle area.  She walks with a limp, but states this is not new for her.  She presents now for assistance with this issue.   The history is provided by the patient.  Hip Pain  Pertinent negatives include no chest pain, no abdominal pain and no shortness of breath.    Past Medical History:  Diagnosis Date  . Alcohol dependence ([place])   . Anxiety    takes Xanax daily  . Arthritis    shoulders  . Blood transfusion 2004  . Bruises easily    platelets are always high  . Cancer ([place])    cervical  . COPD (chronic obstructive pulmonary disease) ([place])   . Depression    takes Zoloft nightly  . Dizziness    hx of  . Emphysema   . Gastric ulcer    NO PRIOR EGD.   Marland Kitchen GERD (gastroesophageal reflux disease)    takes Omeprazole and Ranitidine daily  . Headache(784.0)   . Heart disease   . History of kidney stones 2000  . Hyperlipidemia    doesn't take any meds at present time  . Hypertension    takes Metoprolol and  Lisinopril occ  . Insomnia    xanax and pain pills help  . Liver disease   . Migraines    last on e3/10/13  . Multiple sclerosis ([place])   . Neck pain    stenosis and radiculopathy  . PONV (postoperative nausea and vomiting)   . Stroke [facility])    TIA-22 yrs ago    Patient Active Problem List   Diagnosis Date Noted  . Hepatic cirrhosis ([place]) 03/23/2017  . Liver lesion 03/23/2017  . Dysphagia 03/23/2017  . Rectal bleeding 03/23/2017  . Adult failure to thrive 08/12/2016  . Change in bowel movement 08/12/2016  . Subacute confusional state 08/12/2016  . Leukocytosis 08/06/2016  . Hypokalemia 07/31/2016  . Hyponatremia 07/31/2016  . Alcohol abuse 07/31/2016  . Anemia 07/31/2016  . Diarrhea 07/31/2016  . Protein-calorie malnutrition, severe ([place]) 07/31/2016  . HTN (hypertension) 07/31/2016  . GERD (gastroesophageal reflux disease) 07/31/2016    Past Surgical History:  Procedure Laterality Date  . ABDOMINAL HYSTERECTOMY  2004   partial d/t fibroid tumors  . BREAST SURGERY  2002   breast reduction  . CERVICAL CONE BIOPSY  20+yrs ago   d/t cervical cancer  .  ECTOPIC PREGNANCY SURGERY  1987/1989  . right hand  90's   cyst removed from top of hand  . TUBAL LIGATION      OB History    Gravida Para Term Preterm AB Living   3 1     2 1    SAB TAB Ectopic Multiple Live Births       2           Home Medications    Prior to Admission medications   Medication Sig Start Date End Date Taking? Authorizing Provider  acetaminophen (TYLENOL) 500 MG tablet Take 1,000 mg by mouth every 6 (six) hours as needed for moderate pain.    [provider]  albuterol (PROVENTIL HFA;VENTOLIN HFA) 108 (90 Base) MCG/ACT inhaler Inhale 2 puffs into the lungs every 6 (six) hours as needed. For shortness of breath Patient taking differently: Inhale 2 puffs into the lungs every 6 (six) hours as needed for wheezing or shortness of breath. For shortness of breath  09/01/16   Lauree Chandler,  NP  cyclobenzaprine (FLEXERIL) 10 MG tablet Take 1 tablet (10 mg total) by mouth at bedtime. 04/28/17   Sanjuana Kava, MD  diphenhydrAMINE (BENADRYL) 25 mg capsule Take 25 mg by mouth every 6 (six) hours as needed for itching.    [provider]  gabapentin (NEURONTIN) 300 MG capsule Take 300 mg by mouth 3 (three) times daily.     [provider]  ibuprofen (ADVIL,MOTRIN) 200 MG tablet Take 400 mg by mouth every 6 (six) hours as needed.    [provider]  Multiple Vitamin (MULITIVITAMIN WITH MINERALS) TABS Take 1 tablet by mouth daily.    [provider]  naproxen (NAPROSYN) 500 MG tablet Take 500 mg by mouth 2 (two) times daily with a meal.    [provider]  naproxen sodium (ANAPROX) 220 MG tablet Take 440 mg by mouth 2 (two) times daily as needed (pain).    [provider]  nitroGLYCERIN (NITROSTAT) 0.4 MG SL tablet Place 1 tablet (0.4 mg total) under the tongue every 5 (five) minutes as needed. For chest pain Patient taking differently: Place 0.4 mg under the tongue every 5 (five) minutes as needed for chest pain. For chest pain  09/01/16   Lauree Chandler, NP  Omega-3 1000 MG CAPS Take 1,000 mg by mouth 2 (two) times daily.     [provider]  pantoprazole (PROTONIX) 40 MG tablet Take 40 mg by mouth at bedtime.    [provider]  spironolactone (ALDACTONE) 50 MG tablet Take 50 mg by mouth daily.    [provider]    Family History Family History  Problem Relation Age of Onset  . Arthritis Father   . Cancer Father   . Alcoholism Mother   . Hypertension Mother   . Aneurysm Sister   . Anemia Daughter   . Anesthesia problems Neg Hx   . Hypotension Neg Hx   . Malignant hyperthermia Neg Hx   . Pseudochol deficiency Neg Hx   . Colon cancer Neg Hx   . Colon polyps Neg Hx     Social History Social History   Tobacco Use  . Smoking status: Current Every Day Smoker    Packs/day: 1.00    Years:  32.00    Pack years: 32.00    Types: Cigarettes  . Smokeless tobacco: Never Used  Substance Use Topics  . Alcohol use: No    Alcohol/week: 0.6 oz  Types: 1 Cans of beer per week    Comment: quit drinking September 2017. Used to drink 3-4 40s a day   . Drug use: Yes    Types: Marijuana    Comment: pt states she was told to use this for weight gain      Allergies   Codeine and Tomato   Review of Systems Review of Systems  Constitutional: Negative for activity change.       All ROS Neg except as noted in HPI  HENT: Negative for nosebleeds.   Eyes: Negative for photophobia and discharge.  Respiratory: Negative for cough, shortness of breath and wheezing.   Cardiovascular: Negative for chest pain and palpitations.  Gastrointestinal: Negative for abdominal pain and blood in stool.  Genitourinary: Negative for dysuria, frequency and hematuria.  Musculoskeletal: Positive for arthralgias and back pain. Negative for neck pain.  Skin: Negative.   Neurological: Negative for dizziness, seizures and speech difficulty.  Psychiatric/Behavioral: Negative for confusion and hallucinations.     Physical Exam Updated Vital Signs BP (!) 127/92 (BP Location: Left Arm)   Pulse (!) 105   Temp 97.9 F (36.6 C) (Oral)   Resp 17   Ht 5' (1.524 m)   Wt 55.3 kg (122 lb)   SpO2 100%   BMI 23.83 kg/m   Physical Exam  Constitutional: She is oriented to person, place, and time. She appears well-developed and well-nourished.  Non-toxic appearance.  HENT:  Head: Normocephalic.  Right Ear: Tympanic membrane and external ear normal.  Left Ear: Tympanic membrane and external ear normal.  Eyes: EOM and lids are normal. Pupils are equal, round, and reactive to light.  Neck: Normal range of motion. Neck supple. Carotid bruit is not present.  Cardiovascular: Normal rate, regular rhythm, normal heart sounds, intact distal pulses and normal pulses.  Pulmonary/Chest: Breath sounds normal. No  respiratory distress.  Abdominal: Soft. Bowel sounds are normal. There is no tenderness. There is no guarding.  Musculoskeletal: Normal range of motion.       Left hip: She exhibits tenderness.       Lumbar back: She exhibits pain.       Back:       Legs: Lymphadenopathy:       Head (right side): No submandibular adenopathy present.       Head (left side): No submandibular adenopathy present.    She has no cervical adenopathy.  Neurological: She is alert and oriented to person, place, and time. She has normal strength. No cranial nerve deficit or sensory deficit.  Skin: Skin is warm and dry.  Psychiatric: She has a normal mood and affect. Her speech is normal.  Nursing note and vitals reviewed.    ED Treatments / Results  Labs (all labs ordered are listed, but only abnormal results are displayed) Labs Reviewed - No data to display  EKG  EKG Interpretation None       Radiology No results found.  Procedures Procedures (including critical care time)  Medications Ordered in ED Medications  dexamethasone (DECADRON) injection 10 mg (not administered)  ketorolac (TORADOL) tablet 10 mg (not administered)  traMADol (ULTRAM) tablet 100 mg (not administered)  promethazine (PHENERGAN) tablet 12.5 mg (not administered)     Initial Impression / Assessment and Plan / ED Course  I have reviewed the triage vital signs and the nursing notes.  Pertinent labs & imaging results that were available during my care of the patient were reviewed by me and considered in my medical decision  making (see chart for details).       Final Clinical Impressions(s) / ED Diagnoses MDM The patient denies any recent injury or trauma.  Patient states she has been told she had degenerative disc disease involving her lower back.  She states she is also been told that she had arthritis changes involving her hip.  She has not had any recent operations or procedures on either.  There are no gross  neurovascular deficits appreciated.  Patient will be treated with anti-inflammatory pain medication.  I have asked the patient to see Dr. Luna Glasgow and Dr. Aline Brochure as soon as possible for evaluation concerning the hip and lower back.  Patient is in agreement with this plan.   Final diagnoses:  Left hip pain  Lumbar back pain with radiculopathy affecting left lower extremity    New Prescriptions This SmartLink is deprecated. Use AVSMEDLIST instead to display the medication list for a patient.   Lily Kocher, PA-C 07/17/17 Jeri Lager    Pattricia Boss, MD 07/17/17 2045

## 2017-07-20 ENCOUNTER — Ambulatory Visit (INDEPENDENT_AMBULATORY_CARE_PROVIDER_SITE_OTHER): Payer: Medicare HMO | Admitting: Orthopedic Surgery

## 2017-07-20 DIAGNOSIS — M75121 Complete rotator cuff tear or rupture of right shoulder, not specified as traumatic: Secondary | ICD-10-CM

## 2017-07-20 DIAGNOSIS — M19011 Primary osteoarthritis, right shoulder: Secondary | ICD-10-CM | POA: Diagnosis not present

## 2017-07-20 DIAGNOSIS — M12811 Other specific arthropathies, not elsewhere classified, right shoulder: Secondary | ICD-10-CM

## 2017-07-20 NOTE — Progress Notes (Signed)
Progress Note   Patient ID: Courtney Thornton, female   DOB: [birth year], 51 y.o.   MRN: [id number]  Chief Complaint  Patient presents with  . Shoulder Pain    Right shoulder evaluation for surgery    HPI 51 year old female followed by Dr. Luna Thornton presents with shoulder pain for over a year.  She says her main problem is pain at night it aches every day is worse at night however she can still lift her arm over her head she has not lost any range of motion.  The pain is a dull aching mild to moderate discomfort which is worse with overhead activity  Treatment to date does not include physical therapy or injection that she can remember  Review of Systems  Constitutional: Negative for chills, fever and weight loss.  Respiratory: Negative for shortness of breath.   Cardiovascular: Negative for chest pain.  Skin: Negative.   Neurological: Negative for tingling and sensory change.   No outpatient medications have been marked as taking for the 07/20/17 encounter (Appointment) with Courtney Civil, MD.    Past Medical History:  Diagnosis Date  . Alcohol dependence ([place])   . Anxiety    takes Xanax daily  . Arthritis    shoulders  . Blood transfusion 2004  . Bruises easily    platelets are always high  . Cancer ([place])    cervical  . COPD (chronic obstructive pulmonary disease) ([place])   . Depression    takes Zoloft nightly  . Dizziness    hx of  . Emphysema   . Gastric ulcer    NO PRIOR EGD.   Marland Kitchen GERD (gastroesophageal reflux disease)    takes Omeprazole and Ranitidine daily  . Headache(784.0)   . Heart disease   . History of kidney stones 2000  . Hyperlipidemia    doesn't take any meds at present time  . Hypertension    takes Metoprolol and Lisinopril occ  . Insomnia    xanax and pain pills help  . Liver disease   . Migraines    last on e3/10/13  . Multiple sclerosis ([place])   . Neck pain    stenosis and radiculopathy  . PONV (postoperative nausea and vomiting)   .  Stroke [facility])    TIA-22 yrs ago     Allergies  Allergen Reactions  . Codeine Nausea And Vomiting  . Tomato Rash    There were no vitals taken for this visit.   Physical Exam  Constitutional: She is oriented to person, place, and time. She appears well-developed and well-nourished.  Neurological: She is alert and oriented to person, place, and time.  Skin: Skin is warm and dry. No erythema. No pallor.  Psychiatric: She has a normal mood and affect. Her behavior is normal. Thought content normal.    Ortho Exam Left shoulder looks good there is no swelling or tenderness, she has full range of motion.  She is stable in abduction external rotation and her motor exam rotator cuff normal.  Skin is warm dry and intact without erythema neurovascular exam is normal lymph node exam is normal  Right shoulder she actually has full passive and active range of motion with painful forward flexion and abduction with painful internal rotation.  She has weakness but not severe in the supraspinatus and external rotators.  She is stable in abduction external rotation her skin is warm dry and intact without rash she is neurovascular intact with normal lymph nodes in the axilla and  supraclavicular region  Medical decision-making    Imaging: I have the report below related to MRI dated April 25, 2017  The patient has not had physical therapy or injection  I am concerned after looking at her MRI that she has atrophy of the supraspinatus tendon greater than 50% and infraspinatus tendon which looks like greater than 50% indicating significant fat infiltration and atrophy of the musculature which can lead to poor results with repair  My interpretation is that she has a torn rotator cuff supraspinatus and infraspinatus with greater than 50% atrophy and fatty infiltration as well as glenohumeral arthritis   CLINICAL DATA:  Right shoulder pain for 1 year.   EXAM: MRI OF THE RIGHT SHOULDER WITHOUT  CONTRAST   TECHNIQUE: Multiplanar, multisequence MR imaging of the shoulder was performed. No intravenous contrast was administered.   COMPARISON:  Radiographs dated 04/12/2017   FINDINGS: Rotator cuff: There are large full-thickness retracted tears of the infraspinatus and supraspinous tendons. Anterior fibers of the supraspinous are intact. Subscapularis and teres minor tendons are intact.   Muscles: Marked atrophy of the infraspinatus and supraspinous muscles.   Biceps long head: Properly located. Longitudinal intrasubstance tear in the biceps tendon at the level of the bicipital groove.   Acromioclavicular Joint: Mild AC joint arthropathy. Type 2 acromion.   Glenohumeral Joint: Slight marginal osteophyte formation on the anterior inferior aspect of the humeral head. Small joint effusion with debris in the joint particularly in the subcoracoid recess. The humeral head is slightly superiorly subluxed with respect to the glenoid due to the chronic rotator cuff tears.   Labrum:  Intact.   Bones: Degenerative changes of the greater tuberosity of the proximal humerus.   Other: None   IMPRESSION: 1. Large full-thickness tears of the infraspinatus and supraspinous tendons as described. 2. Slight arthritic changes of the glenohumeral joint. 3. Intrasubstance tear of the long head of the biceps tendon at the level of the bicipital groove. 4. Glenohumeral joint effusion with debris in the joint.     Electronically Signed   By: Courtney Thornton M.D.   On: 04/25/2017 70:34  At 51 years old she is a poor candidate for reverse prosthesis although this would probably in the intermediate term be better than a failed rotator cuff repair.  It may be worth trying to repair the rotator cuff and if that does not work then using the reverse as a final salvage procedure  In any event she has not had physical therapy so we will send her for therapy give her an injection and see how she  does over 6-8 weeks Encounter Diagnoses  Name Primary?  . 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 Rotator cuff arthropathy of right shoulder   . Glenohumeral arthritis, right     Recommend subacromial injection #1 Recommend physical therapy at hand and rehab 50-month follow-up     Arther Abbott, MD 07/20/2017 1:53 PM

## 2017-07-20 NOTE — Patient Instructions (Signed)
Start physical therapy at [facility]  You have received an injection of steroids into the joint. 15% of patients will have increased pain within the 24 hours postinjection.   This is transient and will go away.   We recommend that you use ice packs on the injection site for 20 minutes every 2 hours and extra strength Tylenol 2 tablets every 8 as needed until the pain resolves.  If you continue to have pain after taking the Tylenol and using the ice please call the office for further instructions.

## 2017-07-21 ENCOUNTER — Other Ambulatory Visit (HOSPITAL_COMMUNITY): Payer: Self-pay | Admitting: Emergency Medicine

## 2017-07-21 DIAGNOSIS — K769 Liver disease, unspecified: Secondary | ICD-10-CM

## 2017-07-21 DIAGNOSIS — K7030 Alcoholic cirrhosis of liver without ascites: Secondary | ICD-10-CM

## 2017-07-21 DIAGNOSIS — K7682 Hepatic encephalopathy: Secondary | ICD-10-CM

## 2017-07-21 DIAGNOSIS — K7290 Hepatic failure, unspecified without coma: Secondary | ICD-10-CM

## 2017-07-26 ENCOUNTER — Ambulatory Visit (HOSPITAL_COMMUNITY)
Admission: RE | Admit: 2017-07-26 | Discharge: 2017-07-26 | Disposition: A | Discharge status: Home / Self Care | Payer: Medicare HMO | Source: Ambulatory Visit | Attending: Emergency Medicine | Admitting: Emergency Medicine

## 2017-07-26 DIAGNOSIS — K7030 Alcoholic cirrhosis of liver without ascites: Secondary | ICD-10-CM | POA: Diagnosis not present

## 2017-07-26 DIAGNOSIS — K7290 Hepatic failure, unspecified without coma: Secondary | ICD-10-CM | POA: Insufficient documentation

## 2017-07-26 DIAGNOSIS — K769 Liver disease, unspecified: Secondary | ICD-10-CM | POA: Diagnosis present

## 2017-07-26 DIAGNOSIS — I700 Atherosclerosis of aorta: Secondary | ICD-10-CM | POA: Diagnosis not present

## 2017-07-26 DIAGNOSIS — K7682 Hepatic encephalopathy: Secondary | ICD-10-CM

## 2017-07-26 MED ORDER — GADOBENATE DIMEGLUMINE 529 MG/ML IV SOLN
10.0000 mL | Freq: Once | INTRAVENOUS | Status: AC | PRN
  Administered 2017-07-26: 10 mL via INTRAVENOUS

## 2017-08-15 ENCOUNTER — Telehealth: Payer: Self-pay | Admitting: Orthopedic Surgery

## 2017-08-15 NOTE — Telephone Encounter (Signed)
Patient stopped in the office wanting to let Dr. Ruthe Mannan nurse know that she was not going to Physical Therapy. Stated she could do the exercises at home. The nurse was with another patient and I relayed the message in person. Amy told me she would be sure Dr. Aline Brochure was aware.

## 2017-08-16 ENCOUNTER — Ambulatory Visit (INDEPENDENT_AMBULATORY_CARE_PROVIDER_SITE_OTHER): Payer: Medicare HMO | Admitting: Gastroenterology

## 2017-08-16 ENCOUNTER — Encounter: Payer: Self-pay | Admitting: Gastroenterology

## 2017-08-16 VITALS — BP 154/89 | HR 86 | Temp 97.7°F | Ht 60.0 in | Wt 120.4 lb

## 2017-08-16 DIAGNOSIS — K7030 Alcoholic cirrhosis of liver without ascites: Secondary | ICD-10-CM

## 2017-08-16 NOTE — Patient Instructions (Signed)
Please have blood work done today.   Your next MRI of the liver will be in May 2019.  I will see you around June 2019.  Please call with any issues!  Have a fabulous Christmas!

## 2017-08-16 NOTE — Progress Notes (Signed)
Referring Provider: Vidal Schwalbe, MD Primary Care Physician:  Vidal Schwalbe, MD  Chief Complaint  Patient presents with  . Rectal Bleeding    f/u, doing ok    HPI:   Courtney Thornton is a 51 y.o. female presenting today with a history of  alcohol abuse and sober since Sept 2017, with radiological findings of likely cirrhosis.  She had negative viral markers on file in Nov 2017. MRI in July 2018 with 16 mm enhancing lesion in segment 7 of liver, indeterminate. AFP normal. IR stated would be difficult to biopsy with CT, unable to see with ultrasound for sampling. Repeat MRI in Nov 2018 with stable lesion, recommending follow-up MRI with hepatic protocol in 6-12 months. Since last seen, she has completed EGD with empiric dilation due to dysphagia. No varices noted and will need surveillance in 2 years. Colonoscopy unremarkable.   Having left hip pain. Sees Dr. Luna Glasgow. No dysphagia. Started Hep A/B vaccinations. No abdominal pain, confusion. Feels well. No jaundice. Continues to abstain from ETOH.   Past Medical History:  Diagnosis Date  . Alcohol dependence ([place])   . Anxiety    takes Xanax daily  . Arthritis    shoulders  . Blood transfusion 2004  . Bruises easily    platelets are always high  . Cancer ([place])    cervical  . COPD (chronic obstructive pulmonary disease) ([place])   . Depression    takes Zoloft nightly  . Dizziness    hx of  . Emphysema   . Gastric ulcer    NO PRIOR EGD.   Marland Kitchen GERD (gastroesophageal reflux disease)    takes Omeprazole and Ranitidine daily  . Headache(784.0)   . Heart disease   . History of kidney stones 2000  . Hyperlipidemia    doesn't take any meds at present time  . Hypertension    takes Metoprolol and Lisinopril occ  . Insomnia    xanax and pain pills help  . Liver disease   . Migraines    last on e3/10/13  . Multiple sclerosis ([place])   . Neck pain    stenosis and radiculopathy  . PONV (postoperative nausea and vomiting)   .  Stroke [facility])    TIA-22 yrs ago    Past Surgical History:  Procedure Laterality Date  . ABDOMINAL HYSTERECTOMY  2004   partial d/t fibroid tumors  . ANTERIOR CERVICAL DECOMP/DISCECTOMY FUSION  11/24/2011   Procedure: ANTERIOR CERVICAL DECOMPRESSION/DISCECTOMY FUSION 3 LEVELS;  Surgeon: Hosie Spangle, MD;  Location: [place] [facility];  Service: Neurosurgery;  Laterality: N/A;  Cervical three-four, four-five,five-six anterior cervical decompression with fusion plating and bonegraft  . BREAST SURGERY  2002   breast reduction  . CERVICAL CONE BIOPSY  20+yrs ago   d/t cervical cancer  . COLONOSCOPY WITH PROPOFOL N/A 04/27/2017   Dr. Gala Romney" congest mucosa likely secondary to cirrhosis. internal hemorrhoids. otherwise normal.   . ECTOPIC PREGNANCY SURGERY  1987/1989  . ESOPHAGOGASTRODUODENOSCOPY (EGD) WITH PROPOFOL N/A 04/27/2017   Dr. Gala Romney: normal esophagus s/p dilation. small hiatal hernia, portal gastropathy, normal duodenum. No specimens collected. Surveillance in 2 years   . MALONEY DILATION N/A 04/27/2017   Procedure: Venia Minks DILATION;  Surgeon: Daneil Dolin, MD;  [facility];  Service: Endoscopy;  Laterality: N/A;  . right hand  90's   cyst removed from top of hand  . TUBAL LIGATION      Current Outpatient Medications  Medication Sig Dispense Refill  .  acetaminophen (TYLENOL) 500 MG tablet Take 1,000 mg by mouth every 6 (six) hours as needed for moderate pain.    Marland Kitchen albuterol (PROVENTIL HFA;VENTOLIN HFA) 108 (90 Base) MCG/ACT inhaler Inhale 2 puffs into the lungs every 6 (six) hours as needed. For shortness of breath (Patient taking differently: Inhale 2 puffs into the lungs every 6 (six) hours as needed for wheezing or shortness of breath. For shortness of breath ) 8 g 0  . aspirin 81 MG chewable tablet Chew daily by mouth.    . cyclobenzaprine (FLEXERIL) 10 MG tablet Take 1 tablet (10 mg total) by mouth at bedtime. 30 tablet 1  . diphenhydrAMINE (BENADRYL) 25 mg capsule  Take 25 mg by mouth every 6 (six) hours as needed for itching.    . gabapentin (NEURONTIN) 300 MG capsule Take 300 mg by mouth 3 (three) times daily.     . Multiple Vitamin (MULITIVITAMIN WITH MINERALS) TABS Take 1 tablet by mouth daily.    . naproxen (NAPROSYN) 500 MG tablet Take 500 mg by mouth 2 (two) times daily with a meal.    . naproxen sodium (ANAPROX) 220 MG tablet Take 440 mg by mouth 2 (two) times daily as needed (pain).    . nitroGLYCERIN (NITROSTAT) 0.4 MG SL tablet Place 1 tablet (0.4 mg total) under the tongue every 5 (five) minutes as needed. For chest pain 30 tablet 0  . Omega-3 1000 MG CAPS Take 1,000 mg by mouth daily.     . pantoprazole (PROTONIX) 40 MG tablet Take 40 mg by mouth at bedtime.    . traMADol (ULTRAM) 50 MG tablet Take 1 tablet (50 mg total) every 6 (six) hours as needed by mouth. 15 tablet 0  . potassium chloride (K-DUR) 10 MEQ tablet Take 2 tablets (20 mEq total) by mouth 2 (two) times daily for 2 days. 8 tablet 0   No current facility-administered medications for this visit.     Allergies as of 08/16/2017 - Review Complete 08/16/2017  Allergen Reaction Noted  . Codeine Nausea And Vomiting 11/19/2011  . Tomato Rash 04/20/2017    Family History  Problem Relation Age of Onset  . Arthritis Father   . Cancer Father   . Alcoholism Mother   . Hypertension Mother   . Aneurysm Sister   . Anemia Daughter   . Anesthesia problems Neg Hx   . Hypotension Neg Hx   . Malignant hyperthermia Neg Hx   . Pseudochol deficiency Neg Hx   . Colon cancer Neg Hx   . Colon polyps Neg Hx     Social History   Socioeconomic History  . Marital status: Married    Spouse name: None  . Number of children: None  . Years of education: None  . Highest education level: None  Social Needs  . Financial resource strain: None  . Food insecurity - worry: None  . Food insecurity - inability: None  . Transportation needs - medical: None  . Transportation needs - non-medical:  None  Occupational History  . Occupation: disability  Tobacco Use  . Smoking status: Current Every Day Smoker    Packs/day: 1.00    Years: 32.00    Pack years: 32.00    Types: Cigarettes  . Smokeless tobacco: Never Used  Substance and Sexual Activity  . Alcohol use: No    Alcohol/week: 0.6 oz    Types: 1 Cans of beer per week    Comment: quit drinking September 2017. Used to drink 3-4 40s  a day   . Drug use: Yes    Types: Marijuana    Comment: pt states she was told to use this for weight gain   . Sexual activity: Yes    Birth control/protection: Surgical  Other Topics Concern  . None  Social History Narrative  . None    Review of Systems: Gen: Denies fever, chills, anorexia. Denies fatigue, weakness, weight loss.  CV: Denies chest pain, palpitations, syncope, peripheral edema, and claudication. Resp: Denies dyspnea at rest, cough, wheezing, coughing up blood, and pleurisy. GI: see HPI  Derm: Denies rash, itching, dry skin Psych: Denies depression, anxiety, memory loss, confusion. No homicidal or suicidal ideation.  Heme: Denies bruising, bleeding, and enlarged lymph nodes.  Physical Exam: BP (!) 154/89   Pulse 86   Temp 97.7 F (36.5 C) (Oral)   Ht 5' (1.524 m)   Wt 120 lb 6.4 oz (54.6 kg)   BMI 23.51 kg/m  General:   Alert and oriented. No distress noted. Pleasant and cooperative.  Head:  Normocephalic and atraumatic. Eyes:  Conjuctiva clear without scleral icterus. Mouth:  Oral mucosa pink and moist.  Abdomen:  +BS, soft, non-tender and non-distended. No rebound or guarding. No HSM or masses noted. Msk:  Symmetrical without gross deformities. Normal posture. Extremities:  Without edema. Neurologic:  Alert and  oriented x4 Psych:  Alert and cooperative. Normal mood and affect.

## 2017-08-17 ENCOUNTER — Encounter: Payer: Self-pay | Admitting: Internal Medicine

## 2017-08-17 LAB — COMPLETE METABOLIC PANEL WITH GFR
AG RATIO: 1.5 (calc) (ref 1.0–2.5)
ALT: 18 U/L (ref 6–29)
AST: 22 U/L (ref 10–35)
Albumin: 4.4 g/dL (ref 3.6–5.1)
Alkaline phosphatase (APISO): 84 U/L (ref 33–130)
BUN: 9 mg/dL (ref 7–25)
CALCIUM: 9.7 mg/dL (ref 8.6–10.4)
CO2: 34 mmol/L — AB (ref 20–32)
CREATININE: 0.65 mg/dL (ref 0.50–1.05)
Chloride: 102 mmol/L (ref 98–110)
GFR, EST AFRICAN AMERICAN: 117 mL/min/{1.73_m2} (ref >=60)
GFR, EST NON AFRICAN AMERICAN: 101 mL/min/{1.73_m2} (ref >=60)
GLOBULIN: 3 g/dL (ref 1.9–3.7)
Glucose, Bld: 104 mg/dL (ref 65–139)
POTASSIUM: 3.1 mmol/L — AB (ref 3.5–5.3)
SODIUM: 142 mmol/L (ref 135–146)
Total Bilirubin: 0.3 mg/dL (ref 0.2–1.2)
Total Protein: 7.4 g/dL (ref 6.1–8.1)

## 2017-08-17 LAB — PROTIME-INR
INR: 1
PROTHROMBIN TIME: 10.5 s (ref 9.0–11.5)

## 2017-08-17 LAB — AFP TUMOR MARKER: AFP-Tumor Marker: 1.9 ng/mL

## 2017-08-18 ENCOUNTER — Telehealth: Payer: Self-pay | Admitting: Orthopedic Surgery

## 2017-08-18 ENCOUNTER — Telehealth: Payer: Self-pay | Admitting: Gastroenterology

## 2017-08-18 ENCOUNTER — Other Ambulatory Visit: Payer: Self-pay

## 2017-08-18 DIAGNOSIS — K7030 Alcoholic cirrhosis of liver without ascites: Secondary | ICD-10-CM

## 2017-08-18 MED ORDER — POTASSIUM CHLORIDE ER 10 MEQ PO TBCR: 20.0000 meq | EXTENDED_RELEASE_TABLET | Freq: Two times a day (BID) | ORAL | 0 refills | Status: DC

## 2017-08-18 NOTE — Telephone Encounter (Signed)
Courtney Thornton: I attempted to call patient. Patient's recent potassium was 3.1. I sent in potassium to take twice a day for 2 days. We need to recheck this next week (BMP). I note that she said she was not taking spironolactone. Let's make sure she isn't taking while she is taking the additional potassium. Otherwise, everything looks great!

## 2017-08-18 NOTE — Telephone Encounter (Signed)
I spoke with patient. She IS on spironolactone. I had her stop this for the 2 days she is on potassium. Please arrange a BMP for her next week. Thanks!

## 2017-08-18 NOTE — Progress Notes (Signed)
Potassium supplementation sent to pharmacy for short course. MELD Na: 6.

## 2017-08-18 NOTE — Telephone Encounter (Signed)
Patient relays that she's been unable to follow through with physical therapy ordered by Dr Aline Brochure, due to other medical issues, as well as financial issues with the copays required for each visit.  Please advise patient, and also let her know if she's to keep the scheduled fol/up visit 09/19/16.

## 2017-08-18 NOTE — Telephone Encounter (Signed)
Pt notified, orders were put in for a CMP and released.

## 2017-08-18 NOTE — Telephone Encounter (Signed)
Called patient to advise she should come in for follow up , so Dr Aline Brochure can keep a watch on her ROM, to make sure her shoulder does not lose motion. She has voiced understanding.   She states she has a hard time paying the copay for physical therapy, I told her I would let you know, and encouraged her to try to move her shoulder as much as she is able  To you FYI

## 2017-08-22 ENCOUNTER — Encounter: Payer: Self-pay | Admitting: Gastroenterology

## 2017-08-22 NOTE — Assessment & Plan Note (Signed)
51 year old female with history of alcohol abuse, cirrhosis, remains abstinent from ETOH since Sept 2017. Up-to-date on variceal screening and due again in 2020. Indeterminate liver lesion on MRI unable to be biopsied via IR, so serial surveillance has been undertaken. Next MRI due in May 2019. Remains well compensated. Check routine labs today. Continue Hep A/B series, which she has already started. Applauded on abstaining from ETOH. Return in 6 months or sooner if needed.

## 2017-08-24 NOTE — Progress Notes (Signed)
CC'D TO PCP °

## 2017-08-25 LAB — COMPREHENSIVE METABOLIC PANEL
AG RATIO: 1.4 (calc) (ref 1.0–2.5)
ALT: 23 U/L (ref 6–29)
AST: 28 U/L (ref 10–35)
Albumin: 4.5 g/dL (ref 3.6–5.1)
Alkaline phosphatase (APISO): 82 U/L (ref 33–130)
BUN: 15 mg/dL (ref 7–25)
CALCIUM: 10.9 mg/dL — AB (ref 8.6–10.4)
CO2: 26 mmol/L (ref 20–32)
Chloride: 102 mmol/L (ref 98–110)
Creat: 0.69 mg/dL (ref 0.50–1.05)
GLUCOSE: 62 mg/dL — AB (ref 65–139)
Globulin: 3.3 g/dL (calc) (ref 1.9–3.7)
Potassium: 4.4 mmol/L (ref 3.5–5.3)
Sodium: 136 mmol/L (ref 135–146)
Total Bilirubin: 0.3 mg/dL (ref 0.2–1.2)
Total Protein: 7.8 g/dL (ref 6.1–8.1)

## 2017-09-04 NOTE — Progress Notes (Signed)
Potassium now normal

## 2017-09-19 ENCOUNTER — Ambulatory Visit: Payer: Medicare HMO | Admitting: Orthopedic Surgery

## 2017-09-20 IMAGING — MR MR SHOULDER*R* W/O CM
4 of 5 series · 26 of 40 positions shown · non-contrast
Comparison: Radiographs dated 04/12/2017

CLINICAL DATA: Right shoulder pain for 1 year.

EXAM:
MRI OF THE RIGHT SHOULDER WITHOUT CONTRAST
TECHNIQUE: Multiplanar, multisequence MR imaging of the shoulder was performed.
No intravenous contrast was administered.

[Series 4: t2fs axial blade · axial · 3.0mm · 0.50mm/px · z∈[-15,+48]mm · 7 of 22 slices shown]
[im 1/22]
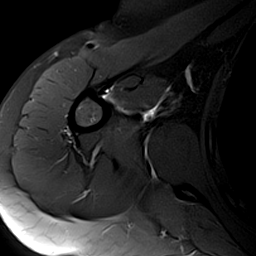
[im 4/22]
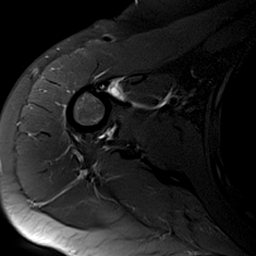
[im 7/22]
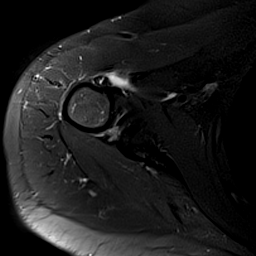
[im 10/22]
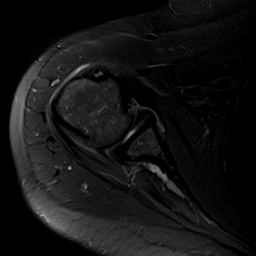
[im 13/22]
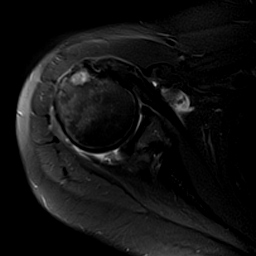
[im 16/22]
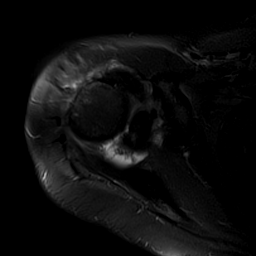
[im 19/22]
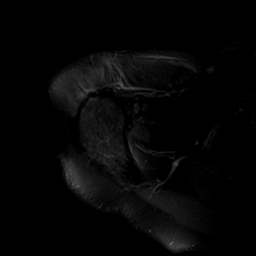

[Series 5: t2fs_blade_cor · oblique · 3.0mm · 0.50mm/px · 3 of 20 slices shown]
[im 4/20]
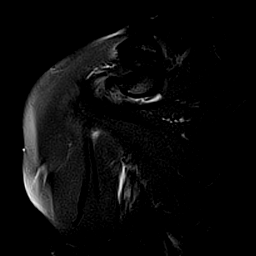
[im 10/20]
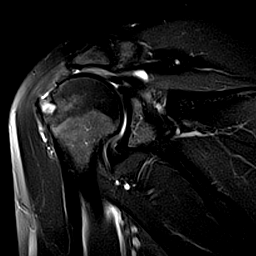
[im 16/20]
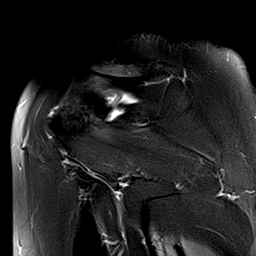

[Series 6: PD · oblique · 3.0mm · 0.25mm/px · 7 of 20 slices shown]
[im 1/20]
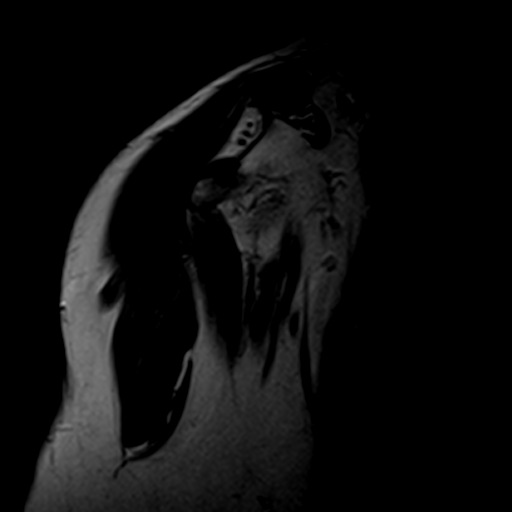
[im 4/20]
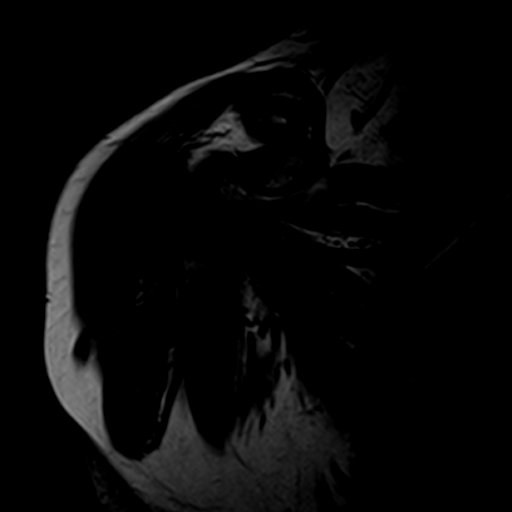
[im 7/20]
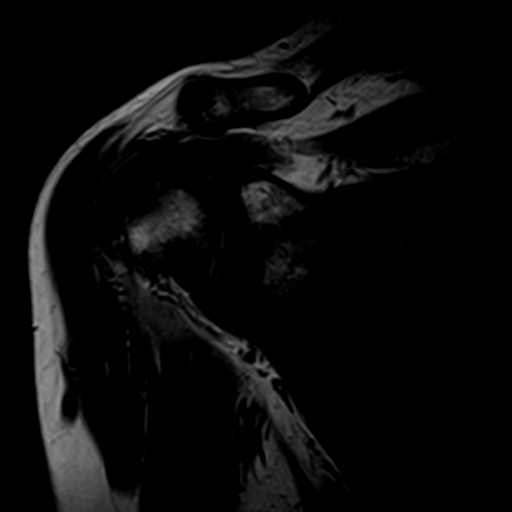
[im 10/20]
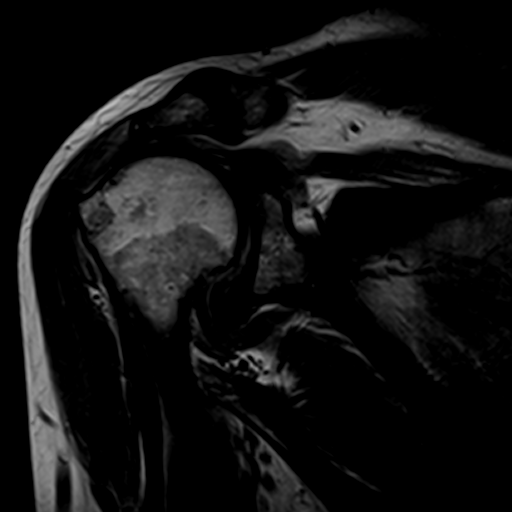
[im 13/20]
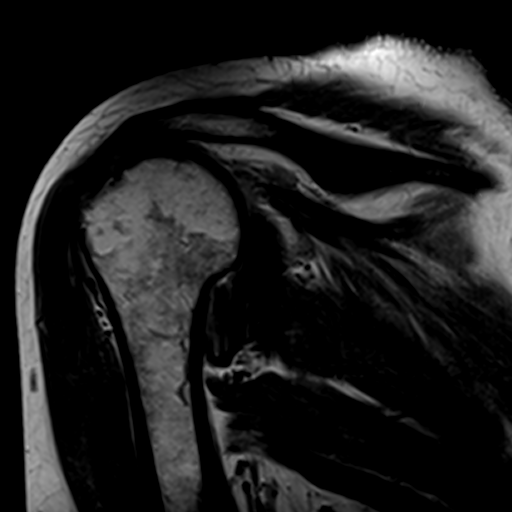
[im 16/20]
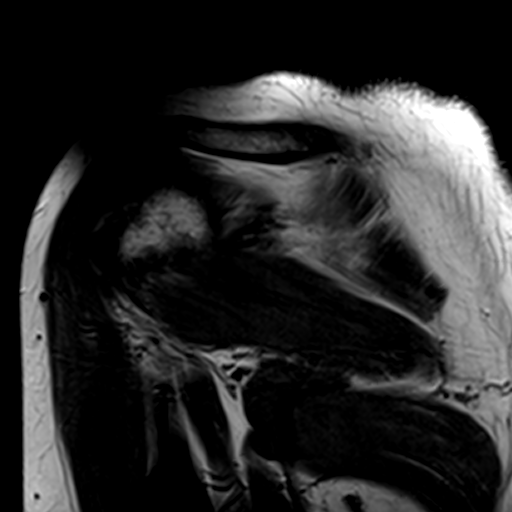
[im 20/20]
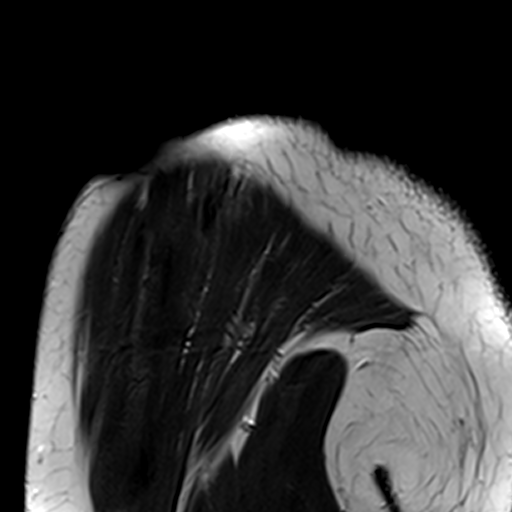

[Series 7: T1 · oblique · 3.0mm · 0.25mm/px · 9 of 26 slices shown]
[im 1/26]
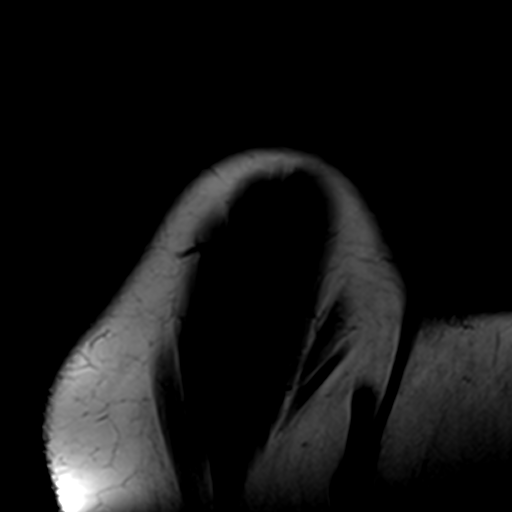
[im 4/26]
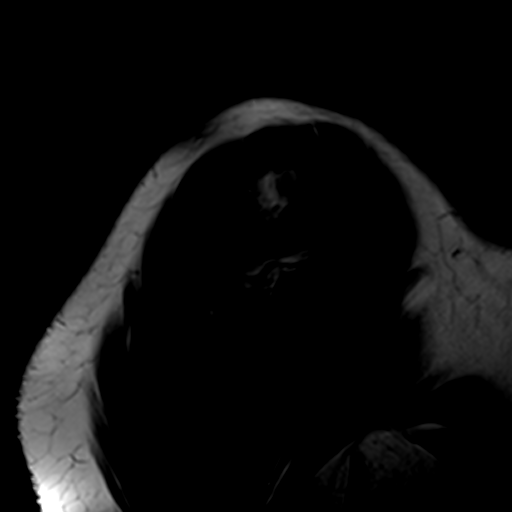
[im 7/26]
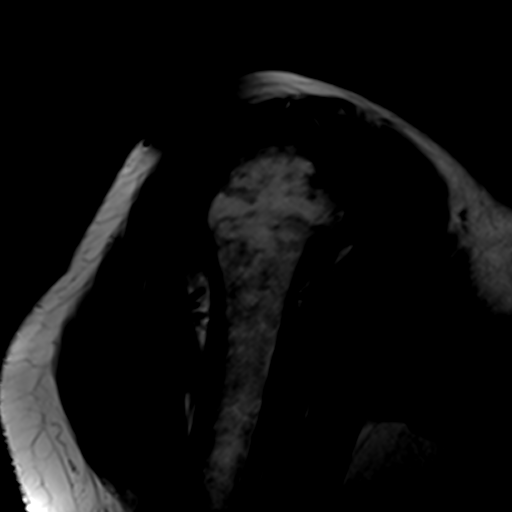
[im 10/26]
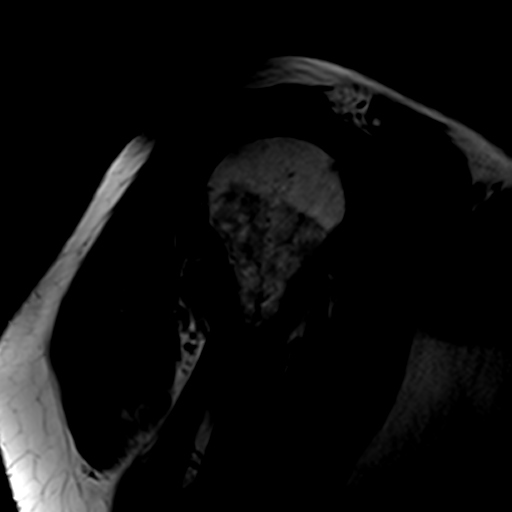
[im 13/26]
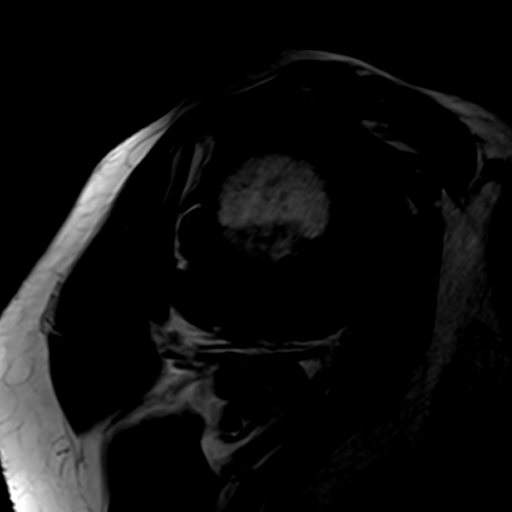
[im 16/26]
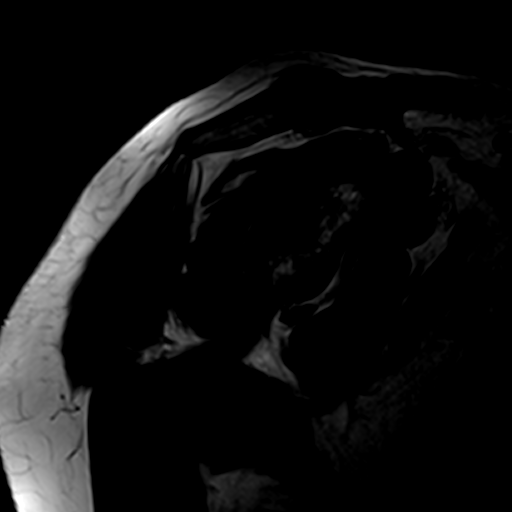
[im 19/26]
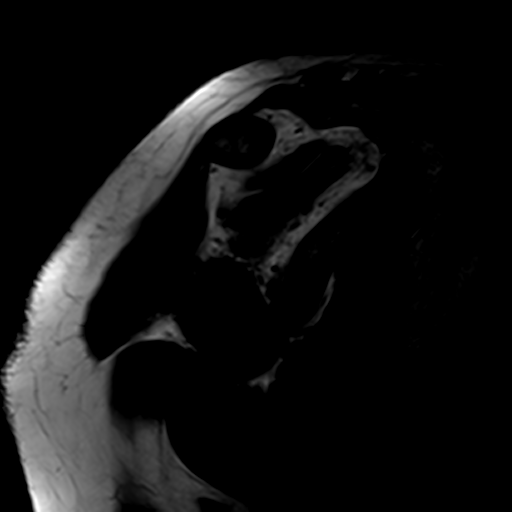
[im 22/26]
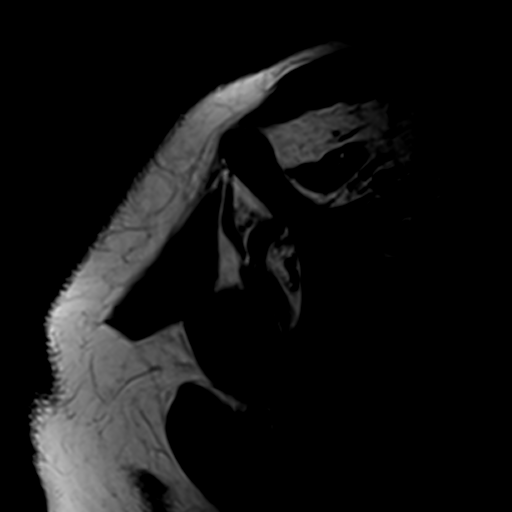
[im 26/26]
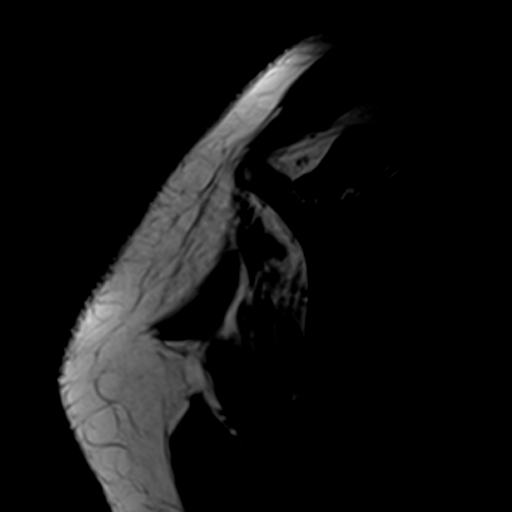

[26 of 40 positions shown; findings below may reference images not displayed]

FINDINGS: Rotator cuff: There are large full-thickness retracted tears of the
infraspinatus and supraspinous tendons. Anterior fibers of the
supraspinous are intact. Subscapularis and teres minor tendons are
intact.

Muscles: Marked atrophy of the infraspinatus and supraspinous
muscles.

Biceps long head: Properly located. Longitudinal intrasubstance tear
in the biceps tendon at the level of the bicipital groove.

Acromioclavicular Joint: Mild AC joint arthropathy. Type 2 acromion.

Glenohumeral Joint: Slight marginal osteophyte formation on the
anterior inferior aspect of the humeral head. Small joint effusion
with debris in the joint particularly in the subcoracoid recess. The
humeral head is slightly superiorly subluxed with respect to the
glenoid due to the chronic rotator cuff tears.

Labrum:  Intact.

Bones: Degenerative changes of the greater tuberosity of the
proximal humerus.

Other: None
IMPRESSION: 1. Large full-thickness tears of the infraspinatus and supraspinous
tendons as described.
2. Slight arthritic changes of the glenohumeral joint.
3. Intrasubstance tear of the long head of the biceps tendon at the
level of the bicipital groove.
4. Glenohumeral joint effusion with debris in the joint.

## 2017-12-14 ENCOUNTER — Telehealth: Payer: Self-pay | Admitting: Internal Medicine

## 2017-12-14 NOTE — Telephone Encounter (Signed)
Letter mailed

## 2017-12-14 NOTE — Telephone Encounter (Signed)
Recall for mri °

## 2018-02-15 ENCOUNTER — Ambulatory Visit: Payer: Medicare HMO | Admitting: Gastroenterology

## 2018-02-15 ENCOUNTER — Other Ambulatory Visit: Payer: Self-pay | Admitting: *Deleted

## 2018-02-15 ENCOUNTER — Encounter: Payer: Self-pay | Admitting: *Deleted

## 2018-02-15 ENCOUNTER — Encounter: Payer: Self-pay | Admitting: Gastroenterology

## 2018-02-15 VITALS — BP 113/70 | HR 100 | Temp 97.8°F | Ht 61.0 in | Wt 118.2 lb

## 2018-02-15 DIAGNOSIS — K769 Liver disease, unspecified: Secondary | ICD-10-CM | POA: Diagnosis not present

## 2018-02-15 DIAGNOSIS — K7030 Alcoholic cirrhosis of liver without ascites: Secondary | ICD-10-CM

## 2018-02-15 DIAGNOSIS — R040 Epistaxis: Secondary | ICD-10-CM

## 2018-02-15 NOTE — Patient Instructions (Signed)
Please have blood work done today. We have also ordered the MRI to keep tabs on your liver.  I am so proud of you! Keep up the good work!  I will see you in 6 months.   We have referred you to the Ear, Nose, and Throat specialist.  It was a pleasure to see you today. I strive to create trusting relationships with patients to provide genuine, compassionate, and quality care. I value your feedback. If you receive a survey regarding your visit,  I greatly appreciate you taking time to fill this out.   Annitta Needs, PhD, ANP-BC [facility]

## 2018-02-15 NOTE — Progress Notes (Signed)
cc'd to pcp 

## 2018-02-15 NOTE — Progress Notes (Signed)
Primary Care Physician:  Vidal Schwalbe, MD  Primary GI: Dr. Gala Romney   Chief Complaint  Patient presents with  . Cirrhosis    HPI:   Courtney Thornton is a 52 y.o. female presenting today with a history of alcohol abuse and sober since Sept 2017, with radiological findings of likely cirrhosis.  She had negative viral markers on file in Nov 2017. MRI in July 2018 with 16 mm enhancing lesion in segment 7 of liver, indeterminate. AFP normal. IR stated would be difficult to biopsy with CT, unable to see with ultrasound for sampling. Repeat MRI in Nov 2018 with stable lesion, recommending follow-up MRI with hepatic protocol in 6-12 months. Surveillance EGD due in Aug 2020.    Was constipated a few days ago. Eating cereal with milk helped. Naproxen BID. Has intermittent nose bleeds for the past year. No other symptoms. Always left nostril. No thrombocytopenia or coagulopathy. Denies abdominal pain, mental status changes, overt GI bleeding. Continues to abstain from ETOH.   Past Medical History:  Diagnosis Date  . Alcohol dependence ([place])   . Anxiety    takes Xanax daily  . Arthritis    shoulders  . Blood transfusion 2004  . Bruises easily    platelets are always high  . Cancer ([place])    cervical  . COPD (chronic obstructive pulmonary disease) ([place])   . Depression    takes Zoloft nightly  . Dizziness    hx of  . Emphysema   . Gastric ulcer    NO PRIOR EGD.   Marland Kitchen GERD (gastroesophageal reflux disease)    takes Omeprazole and Ranitidine daily  . Headache(784.0)   . Heart disease   . History of kidney stones 2000  . Hyperlipidemia    doesn't take any meds at present time  . Hypertension    takes Metoprolol and Lisinopril occ  . Insomnia    xanax and pain pills help  . Liver disease   . Migraines    last on e3/10/13  . Multiple sclerosis ([place])   . Neck pain    stenosis and radiculopathy  . PONV (postoperative nausea and vomiting)   . Stroke [facility])    TIA-22 yrs ago     Past Surgical History:  Procedure Laterality Date  . ABDOMINAL HYSTERECTOMY  2004   partial d/t fibroid tumors  . ANTERIOR CERVICAL DECOMP/DISCECTOMY FUSION  11/24/2011   Procedure: ANTERIOR CERVICAL DECOMPRESSION/DISCECTOMY FUSION 3 LEVELS;  Surgeon: Hosie Spangle, MD;  Location: [place] [facility];  Service: Neurosurgery;  Laterality: N/A;  Cervical three-four, four-five,five-six anterior cervical decompression with fusion plating and bonegraft  . BREAST SURGERY  2002   breast reduction  . CERVICAL CONE BIOPSY  20+yrs ago   d/t cervical cancer  . COLONOSCOPY WITH PROPOFOL N/A 04/27/2017   Dr. Gala Romney" congest mucosa likely secondary to cirrhosis. internal hemorrhoids. otherwise normal.   . ECTOPIC PREGNANCY SURGERY  1987/1989  . ESOPHAGOGASTRODUODENOSCOPY (EGD) WITH PROPOFOL N/A 04/27/2017   Dr. Gala Romney: normal esophagus s/p dilation. small hiatal hernia, portal gastropathy, normal duodenum. No specimens collected. Surveillance in 2 years   . MALONEY DILATION N/A 04/27/2017   Procedure: Venia Minks DILATION;  Surgeon: Daneil Dolin, MD;  [facility];  Service: Endoscopy;  Laterality: N/A;  . right hand  90's   cyst removed from top of hand  . TUBAL LIGATION      Current Outpatient Medications  Medication Sig Dispense Refill  . acetaminophen (TYLENOL) 500 MG tablet Take 1,000  mg by mouth every 6 (six) hours as needed for moderate pain.    Marland Kitchen albuterol (PROVENTIL HFA;VENTOLIN HFA) 108 (90 Base) MCG/ACT inhaler Inhale 2 puffs into the lungs every 6 (six) hours as needed. For shortness of breath (Patient taking differently: Inhale 2 puffs into the lungs every 6 (six) hours as needed for wheezing or shortness of breath. For shortness of breath ) 8 g 0  . aspirin 81 MG chewable tablet Chew daily by mouth.    . cyclobenzaprine (FLEXERIL) 10 MG tablet Take 1 tablet (10 mg total) by mouth at bedtime. 30 tablet 1  . diphenhydrAMINE (BENADRYL) 25 mg capsule Take 25 mg by mouth every 6 (six)  hours as needed for itching.    . gabapentin (NEURONTIN) 300 MG capsule Take 300 mg by mouth 3 (three) times daily.     . Multiple Vitamin (MULITIVITAMIN WITH MINERALS) TABS Take 1 tablet by mouth daily.    . naproxen (NAPROSYN) 500 MG tablet Take 500 mg by mouth 2 (two) times daily with a meal.    . naproxen sodium (ANAPROX) 220 MG tablet Take 440 mg by mouth 2 (two) times daily as needed (pain).    . nitroGLYCERIN (NITROSTAT) 0.4 MG SL tablet Place 1 tablet (0.4 mg total) under the tongue every 5 (five) minutes as needed. For chest pain 30 tablet 0  . Omega-3 1000 MG CAPS Take 1,000 mg by mouth daily.     . pantoprazole (PROTONIX) 40 MG tablet Take 40 mg by mouth at bedtime.    . traMADol (ULTRAM) 50 MG tablet Take 1 tablet (50 mg total) every 6 (six) hours as needed by mouth. 15 tablet 0  . potassium chloride (K-DUR) 10 MEQ tablet Take 2 tablets (20 mEq total) by mouth 2 (two) times daily for 2 days. 8 tablet 0   No current facility-administered medications for this visit.     Allergies as of 02/15/2018 - Review Complete 02/15/2018  Allergen Reaction Noted  . Codeine Nausea And Vomiting 11/19/2011  . Tomato Rash 04/20/2017    Family History  Problem Relation Age of Onset  . Arthritis Father   . Cancer Father   . Alcoholism Mother   . Hypertension Mother   . Aneurysm Sister   . Anemia Daughter   . Anesthesia problems Neg Hx   . Hypotension Neg Hx   . Malignant hyperthermia Neg Hx   . Pseudochol deficiency Neg Hx   . Colon cancer Neg Hx   . Colon polyps Neg Hx     Social History   Socioeconomic History  . Marital status: Married    Spouse name: Not on file  . Number of children: Not on file  . Years of education: Not on file  . Highest education level: Not on file  Occupational History  . Occupation: disability  Social Needs  . Financial resource strain: Not on file  . Food insecurity:    Worry: Not on file    Inability: Not on file  . Transportation needs:     Medical: Not on file    Non-medical: Not on file  Tobacco Use  . Smoking status: Current Every Day Smoker    Packs/day: 1.00    Years: 32.00    Pack years: 32.00    Types: Cigarettes  . Smokeless tobacco: Never Used  Substance and Sexual Activity  . Alcohol use: No    Alcohol/week: 0.6 oz    Types: 1 Cans of beer per week  Comment: quit drinking September 2017. Used to drink 3-4 40s a day   . Drug use: Yes    Types: Marijuana    Comment: pt states she was told to use this for weight gain   . Sexual activity: Yes    Birth control/protection: Surgical  Lifestyle  . Physical activity:    Days per week: Not on file    Minutes per session: Not on file  . Stress: Not on file  Relationships  . Social connections:    Talks on phone: Not on file    Gets together: Not on file    Attends religious service: Not on file    Active member of club or organization: Not on file    Attends meetings of clubs or organizations: Not on file    Relationship status: Not on file  Other Topics Concern  . Not on file  Social History Narrative  . Not on file    Review of Systems: Gen: Denies fever, chills, anorexia. Denies fatigue, weakness, weight loss.  CV: Denies chest pain, palpitations, syncope, peripheral edema, and claudication. Resp: Denies dyspnea at rest, cough, wheezing, coughing up blood, and pleurisy. GI: see HPI  Derm: Denies rash, itching, dry skin Psych: Denies depression, anxiety, memory loss, confusion. No homicidal or suicidal ideation.  Heme: see HPI   Physical Exam: BP 113/70   Pulse 100   Temp 97.8 F (36.6 C) (Oral)   Ht 5\' 1"  (1.549 m)   Wt 118 lb 3.2 oz (53.6 kg)   BMI 22.33 kg/m  General:   Alert and oriented. No distress noted. Pleasant and cooperative.  Head:  Normocephalic and atraumatic. Eyes:  Conjuctiva clear without scleral icterus. Mouth:  Oral mucosa pink and moist.  Abdomen:  +BS, soft, non-tender and non-distended. No rebound or guarding. No HSM  or masses noted. Msk:  Symmetrical without gross deformities. Normal posture. Extremities:  Without edema. Neurologic:  Alert and  oriented x4 Psych:  Alert and cooperative. Normal mood and affect.

## 2018-02-15 NOTE — Assessment & Plan Note (Signed)
52 year old female with history of alcohol abuse but sober since Sept 2017, cirrhosis, up-to-date on variceal screening and due again in 2020. Well-compensated. Indeterminate liver lesion on MRI unable to be biopsied via IR, so serial surveillance has been modality. MRI due now. Routine labs today, and refer to ENT due to chronic, intermittent nose bleeds. As of note, no coagulopathy or thrombocytopenia. Return in 6 months.

## 2018-02-16 ENCOUNTER — Telehealth: Payer: Self-pay | Admitting: *Deleted

## 2018-02-16 LAB — COMPLETE METABOLIC PANEL WITH GFR
AG RATIO: 1.4 (calc) (ref 1.0–2.5)
ALBUMIN MSPROF: 4.2 g/dL (ref 3.6–5.1)
ALKALINE PHOSPHATASE (APISO): 79 U/L (ref 33–130)
ALT: 18 U/L (ref 6–29)
AST: 20 U/L (ref 10–35)
BILIRUBIN TOTAL: 0.3 mg/dL (ref 0.2–1.2)
BUN: 9 mg/dL (ref 7–25)
CHLORIDE: 104 mmol/L (ref 98–110)
CO2: 29 mmol/L (ref 20–32)
Calcium: 9.2 mg/dL (ref 8.6–10.4)
Creat: 0.75 mg/dL (ref 0.50–1.05)
GFR, EST AFRICAN AMERICAN: 105 mL/min/{1.73_m2} (ref >=60)
GFR, Est Non African American: 91 mL/min/{1.73_m2} (ref >=60)
GLOBULIN: 2.9 g/dL (ref 1.9–3.7)
Glucose, Bld: 89 mg/dL (ref 65–139)
POTASSIUM: 3.8 mmol/L (ref 3.5–5.3)
SODIUM: 140 mmol/L (ref 135–146)
TOTAL PROTEIN: 7.1 g/dL (ref 6.1–8.1)

## 2018-02-16 LAB — CBC WITH DIFFERENTIAL/PLATELET
BASOS ABS: 56 {cells}/uL (ref 0–200)
Basophils Relative: 0.5 %
EOS PCT: 2 %
Eosinophils Absolute: 222 cells/uL (ref 15–500)
HEMATOCRIT: 35.5 % (ref 35.0–45.0)
HEMOGLOBIN: 12 g/dL (ref 11.7–15.5)
LYMPHS ABS: 5250 {cells}/uL — AB (ref 850–3900)
MCH: 28.9 pg (ref 27.0–33.0)
MCHC: 33.8 g/dL (ref 32.0–36.0)
MCV: 85.5 fL (ref 80.0–100.0)
MPV: 11.2 fL (ref 7.5–12.5)
Monocytes Relative: 5.4 %
NEUTROS ABS: 4973 {cells}/uL (ref 1500–7800)
Neutrophils Relative %: 44.8 %
Platelets: 349 10*3/uL (ref 140–400)
RBC: 4.15 10*6/uL (ref 3.80–5.10)
RDW: 13.2 % (ref 11.0–15.0)
Total Lymphocyte: 47.3 %
WBC: 11.1 10*3/uL — AB (ref 3.8–10.8)
WBCMIX: 599 {cells}/uL (ref 200–950)

## 2018-02-16 LAB — AFP TUMOR MARKER: AFP TUMOR MARKER: 1.2 ng/mL

## 2018-02-16 LAB — PROTIME-INR
INR: 0.9
PROTHROMBIN TIME: 9.6 s (ref 9.0–11.5)

## 2018-02-16 NOTE — Telephone Encounter (Signed)
MRI was approved via [employer] website. Auth # [id number]. Date 02/21/18

## 2018-02-20 NOTE — Progress Notes (Signed)
MELD Na is 6. Mildly elevated white count, non-specific. LFTs, INR normal. AFP tumor marker remains low. Await MRI findings.

## 2018-02-21 ENCOUNTER — Ambulatory Visit (HOSPITAL_COMMUNITY): Payer: Medicare HMO

## 2018-03-15 ENCOUNTER — Other Ambulatory Visit: Payer: Self-pay

## 2018-03-15 ENCOUNTER — Encounter (HOSPITAL_COMMUNITY): Payer: Self-pay | Admitting: Emergency Medicine

## 2018-03-15 ENCOUNTER — Emergency Department (HOSPITAL_COMMUNITY): Payer: Medicare HMO

## 2018-03-15 ENCOUNTER — Emergency Department (HOSPITAL_COMMUNITY)
Admission: EM | Admit: 2018-03-15 | Discharge: 2018-03-15 | Disposition: A | Discharge status: Home / Self Care | Payer: Medicare HMO | Attending: Emergency Medicine | Admitting: Emergency Medicine

## 2018-03-15 DIAGNOSIS — W230XXA Caught, crushed, jammed, or pinched between moving objects, initial encounter: Secondary | ICD-10-CM | POA: Insufficient documentation

## 2018-03-15 DIAGNOSIS — Z79899 Other long term (current) drug therapy: Secondary | ICD-10-CM | POA: Insufficient documentation

## 2018-03-15 DIAGNOSIS — I10 Essential (primary) hypertension: Secondary | ICD-10-CM | POA: Insufficient documentation

## 2018-03-15 DIAGNOSIS — Y929 Unspecified place or not applicable: Secondary | ICD-10-CM | POA: Insufficient documentation

## 2018-03-15 DIAGNOSIS — G35 Multiple sclerosis: Secondary | ICD-10-CM | POA: Insufficient documentation

## 2018-03-15 DIAGNOSIS — J449 Chronic obstructive pulmonary disease, unspecified: Secondary | ICD-10-CM | POA: Diagnosis not present

## 2018-03-15 DIAGNOSIS — S63219A Subluxation of metacarpophalangeal joint of unspecified finger, initial encounter: Secondary | ICD-10-CM

## 2018-03-15 DIAGNOSIS — Y998 Other external cause status: Secondary | ICD-10-CM | POA: Insufficient documentation

## 2018-03-15 DIAGNOSIS — Y939 Activity, unspecified: Secondary | ICD-10-CM | POA: Insufficient documentation

## 2018-03-15 DIAGNOSIS — E785 Hyperlipidemia, unspecified: Secondary | ICD-10-CM | POA: Diagnosis not present

## 2018-03-15 DIAGNOSIS — Z8673 Personal history of transient ischemic attack (TIA), and cerebral infarction without residual deficits: Secondary | ICD-10-CM | POA: Insufficient documentation

## 2018-03-15 DIAGNOSIS — S6991XA Unspecified injury of right wrist, hand and finger(s), initial encounter: Secondary | ICD-10-CM | POA: Diagnosis present

## 2018-03-15 DIAGNOSIS — S63111A Subluxation of metacarpophalangeal joint of right thumb, initial encounter: Secondary | ICD-10-CM | POA: Diagnosis not present

## 2018-03-15 DIAGNOSIS — F17210 Nicotine dependence, cigarettes, uncomplicated: Secondary | ICD-10-CM | POA: Insufficient documentation

## 2018-03-15 DIAGNOSIS — Z7982 Long term (current) use of aspirin: Secondary | ICD-10-CM | POA: Diagnosis not present

## 2018-03-15 MED ORDER — HYDROCODONE-ACETAMINOPHEN 5-325 MG PO TABS: 1.0000 | ORAL_TABLET | ORAL | 0 refills | Status: DC | PRN

## 2018-03-15 MED ORDER — HYDROCODONE-ACETAMINOPHEN 5-325 MG PO TABS: 1.0000 | ORAL_TABLET | Freq: Four times a day (QID) | ORAL | 0 refills | Status: DC | PRN

## 2018-03-15 MED ORDER — HYDROCODONE-ACETAMINOPHEN 5-325 MG PO TABS
1.0000 | ORAL_TABLET | Freq: Once | ORAL | Status: AC
  Administered 2018-03-15: 1 via ORAL
  Filled 2018-03-15: qty 1

## 2018-03-15 NOTE — ED Triage Notes (Signed)
Pt reports R hand pain after opening a car door, pain at thumb. Can move thumb and wrist.

## 2018-03-15 NOTE — ED Provider Notes (Signed)
Emergency Department Provider Note   I have reviewed the triage vital signs and the nursing notes.   HISTORY  Chief Complaint Hand Pain   HPI Courtney Thornton is a 52 y.o. female was pulling on a stuck car door with her right hand and when she jerked real hard she had sudden pain in the proximal portion of her right thumb.  She is right-handed.  She had difficulty moving it secondary to the pain so she came here for further evaluation.  Did not hit it on anything else.  Never had a problem at this before.  No recent illnesses.  No other associated injuries per No other associated or modifying symptoms.    Past Medical History:  Diagnosis Date  . Alcohol dependence ([place])   . Anxiety    takes Xanax daily  . Arthritis    shoulders  . Blood transfusion 2004  . Bruises easily    platelets are always high  . Cancer ([place])    cervical  . COPD (chronic obstructive pulmonary disease) ([place])   . Depression    takes Zoloft nightly  . Dizziness    hx of  . Emphysema   . Gastric ulcer    NO PRIOR EGD.   Marland Kitchen GERD (gastroesophageal reflux disease)    takes Omeprazole and Ranitidine daily  . Headache(784.0)   . Heart disease   . History of kidney stones 2000  . Hyperlipidemia    doesn't take any meds at present time  . Hypertension    takes Metoprolol and Lisinopril occ  . Insomnia    xanax and pain pills help  . Liver disease   . Migraines    last on e3/10/13  . Multiple sclerosis ([place])   . Neck pain    stenosis and radiculopathy  . PONV (postoperative nausea and vomiting)   . Stroke [facility])    TIA-22 yrs ago    Patient Active Problem List   Diagnosis Date Noted  . Hepatic cirrhosis ([place]) 03/23/2017  . Liver lesion 03/23/2017  . Dysphagia 03/23/2017  . Rectal bleeding 03/23/2017  . Adult failure to thrive 08/12/2016  . Change in bowel movement 08/12/2016  . Subacute confusional state 08/12/2016  . Leukocytosis 08/06/2016  . Hypokalemia 07/31/2016  .  Hyponatremia 07/31/2016  . Alcohol abuse 07/31/2016  . Anemia 07/31/2016  . Diarrhea 07/31/2016  . Protein-calorie malnutrition, severe ([place]) 07/31/2016  . HTN (hypertension) 07/31/2016  . GERD (gastroesophageal reflux disease) 07/31/2016    Past Surgical History:  Procedure Laterality Date  . ABDOMINAL HYSTERECTOMY  2004   partial d/t fibroid tumors  . ANTERIOR CERVICAL DECOMP/DISCECTOMY FUSION  11/24/2011   Procedure: ANTERIOR CERVICAL DECOMPRESSION/DISCECTOMY FUSION 3 LEVELS;  Surgeon: Hosie Spangle, MD;  Location: [facility];  Service: Neurosurgery;  Laterality: N/A;  Cervical three-four, four-five,five-six anterior cervical decompression with fusion plating and bonegraft  . BREAST SURGERY  2002   breast reduction  . CERVICAL CONE BIOPSY  20+yrs ago   d/t cervical cancer  . COLONOSCOPY WITH PROPOFOL N/A 04/27/2017   Dr. Gala Romney" congest mucosa likely secondary to cirrhosis. internal hemorrhoids. otherwise normal.   . ECTOPIC PREGNANCY SURGERY  1987/1989  . ESOPHAGOGASTRODUODENOSCOPY (EGD) WITH PROPOFOL N/A 04/27/2017   Dr. Gala Romney: normal esophagus s/p dilation. small hiatal hernia, portal gastropathy, normal duodenum. No specimens collected. Surveillance in 2 years   . MALONEY DILATION N/A 04/27/2017   Procedure: Venia Minks DILATION;  Surgeon: Daneil Dolin, MD;  Location[facility];  Service:  Endoscopy;  Laterality: N/A;  . right hand  90's   cyst removed from top of hand  . TUBAL LIGATION      Current Outpatient Rx  . Order #: [id number] Class: Historical Med  . Order #: [id number] Class: Normal  . Order #: [id number] Class: Historical Med  . Order #: [id number] Class: Normal  . Order #: [id number] Class: Historical Med  . Order #: [id number] Class: Historical Med  . Order #: [id number] Class: Print  . Order #: [id number] Class: Historical Med  . Order #: [id number] Class: Historical Med  . Order #: [id number] Class: Historical Med  . Order #: [id number] Class: Normal  . Order #:  [id number] Class: Historical Med  . Order #: [id number] Class: Historical Med  . Order #: [id number] Class: Normal  . Order #: [id number] Class: Print    Allergies Codeine and Tomato  Family History  Problem Relation Age of Onset  . Arthritis Father   . Cancer Father   . Alcoholism Mother   . Hypertension Mother   . Aneurysm Sister   . Anemia Daughter   . Anesthesia problems Neg Hx   . Hypotension Neg Hx   . Malignant hyperthermia Neg Hx   . Pseudochol deficiency Neg Hx   . Colon cancer Neg Hx   . Colon polyps Neg Hx     Social History Social History   Tobacco Use  . Smoking status: Current Every Day Smoker    Packs/day: 1.00    Years: 32.00    Pack years: 32.00    Types: Cigarettes  . Smokeless tobacco: Never Used  Substance Use Topics  . Alcohol use: No    Alcohol/week: 0.6 oz    Types: 1 Cans of beer per week    Comment: quit drinking September 2017. Used to drink 3-4 40s a day   . Drug use: Yes    Types: Marijuana    Comment: pt states she was told to use this for weight gain     Review of Systems  All other systems negative except as documented in the HPI. All pertinent positives and negatives as reviewed in the HPI. ____________________________________________   PHYSICAL EXAM:  VITAL SIGNS: ED Triage Vitals  Enc Vitals Group     BP 03/15/18 1521 122/83     Pulse Rate 03/15/18 1521 98     Resp 03/15/18 1521 18     Temp 03/15/18 1521 98.2 F (36.8 C)     Temp Source 03/15/18 1521 Oral     SpO2 03/15/18 1521 98 %     Weight 03/15/18 1520 118 lb (53.5 kg)     Height 03/15/18 1520 5' (1.524 m)    Constitutional: Alert and oriented. Well appearing and in no acute distress. Eyes: Conjunctivae are normal. PERRL. EOMI. Head: Atraumatic. Nose: No congestion/rhinnorhea. Mouth/Throat: Mucous membranes are moist.  Oropharynx non-erythematous. Neck: No stridor.  No meningeal signs.   Cardiovascular: Normal rate, regular rhythm. Good peripheral circulation.  Grossly normal heart sounds.   Respiratory: Normal respiratory effort.  No retractions. Lungs CTAB. Gastrointestinal: Soft and nontender. No distention.  Musculoskeletal: No lower extremity tenderness nor edema. No gross deformities of extremities. Tenderness to palpation of proximal CP joint of first digit of right hand.  Mild deformity noted. Neurologic:  Normal speech and language. No gross focal neurologic deficits are appreciated.  Skin:  Skin is warm, dry and intact. No rash noted.   ____________________________________________   RADIOLOGY  Dg Finger Thumb Right  Result Date: 03/15/2018 CLINICAL DATA:  Pain and swelling in RIGHT thumb after yanking  on truck door EXAM: RIGHT THUMB 2+V COMPARISON:  None FINDINGS: Osseous mineralization normal. Mild radial subluxation of first metacarpal base at the first [facility] joint. Joint spaces otherwise preserved. No acute fracture, dislocation, or bone destruction. IMPRESSION: Mild radial subluxation of the first metacarpal base at the RIGHT first [facility] joint, which could reflect ligamentous injury; recommend correlation for pain/tenderness at this site. Remainder of exam unremarkable. Electronically Signed   By: Lavonia Dana M.D.   On: 03/15/2018 15:38    ____________________________________________   PROCEDURES  Procedure(s) performed:   Procedures   ____________________________________________   INITIAL IMPRESSION / ASSESSMENT AND PLAN / ED COURSE  Has a subluxation that I cannot reduce.  She has improved range of motion with attempts at reduction however still feel there is deformity.  Suspect ligamentous injury.  Will discuss with hand and get close follow-up.  Spica placed.  After multiple pages, orthopedics did not return page so patient splinted, given pain meds and will call the office for follow up.    Pertinent labs & imaging results that were available during my care of the patient were reviewed by me and considered in my medical  decision making (see chart for details).  ____________________________________________  FINAL CLINICAL IMPRESSION(S) / ED DIAGNOSES  Final diagnoses:  Subluxation of MCP joint of unsp finger, init     MEDICATIONS GIVEN DURING THIS VISIT:  Medications  HYDROcodone-acetaminophen (NORCO/VICODIN) 5-325 MG per tablet 1 tablet (1 tablet Oral Given 03/15/18 1615)     NEW OUTPATIENT MEDICATIONS STARTED DURING THIS VISIT:  Discharge Medication List as of 03/15/2018  4:50 PM    START taking these medications   Details  HYDROcodone-acetaminophen (NORCO/VICODIN) 5-325 MG tablet Take 1-2 tablets by mouth every 6 (six) hours as needed., Starting Wed 03/15/2018, Print        Note:  This note was prepared with assistance of Dragon voice recognition software. Occasional wrong-word or sound-a-like substitutions may have occurred due to the inherent limitations of voice recognition software.   Merrily Pew, MD 03/15/18 2352

## 2018-03-17 ENCOUNTER — Ambulatory Visit (HOSPITAL_COMMUNITY): Payer: Medicare HMO

## 2018-04-06 ENCOUNTER — Ambulatory Visit (INDEPENDENT_AMBULATORY_CARE_PROVIDER_SITE_OTHER): Payer: Medicare HMO | Admitting: Otolaryngology

## 2018-04-10 ENCOUNTER — Ambulatory Visit (INDEPENDENT_AMBULATORY_CARE_PROVIDER_SITE_OTHER): Payer: Medicare HMO | Admitting: Otolaryngology

## 2018-06-19 ENCOUNTER — Telehealth: Payer: Self-pay

## 2018-06-19 NOTE — Telephone Encounter (Signed)
Kylie from pre-service center called office. Pt is scheduled for MRI Liver 06/21/18 and needs PA. Review of chart shows PA was approved in June, but pt rescheduled. PA submitted via [employer]. Case approved. PA# [id number], 06/21/18-07/21/18. Called and informed Kylie.

## 2018-06-21 ENCOUNTER — Ambulatory Visit (HOSPITAL_COMMUNITY): Admission: RE | Admit: 2018-06-21 | Payer: Medicare HMO | Source: Ambulatory Visit

## 2018-12-07 ENCOUNTER — Ambulatory Visit: Payer: Medicare HMO | Admitting: Cardiology

## 2018-12-07 ENCOUNTER — Encounter: Payer: Self-pay | Admitting: Cardiology

## 2018-12-07 NOTE — Progress Notes (Deleted)
Cardiology Office Note  Date: 12/07/2018   ID: Courtney Thornton, DOB 1966-02-16, MRN [id number]  PCP: Vidal Schwalbe, MD  Consulting Cardiologist: Rozann Lesches, MD   No chief complaint on file.   History of Present Illness: Courtney Thornton is a 53 y.o. female referred for cardiology consultation by Dr. Bartolo Darter for the evaluation of chest pain.  She underwent previous cardiac testing through [facility], cardiac catheterization in 2011 demonstrated only a 40% ostial circumflex stenosis, no obstructive lesions, no definitive history of myocardial infarction or PCI based on available records.  Past Medical History:  Diagnosis Date   Anxiety and depression    Cervical cancer (HCC)    COPD (chronic obstructive pulmonary disease) ([place])    Essential hypertension    GERD (gastroesophageal reflux disease)    Headache(784.0)    Hepatic cirrhosis (H[facility])    History of alcohol abuse    History of blood transfusion    History of cardiac catheterization    Mild, nonobstructive circumflex disease 2011 - [facility]   History of kidney stones    History of TIA (transient ischemic attack)    Per patient report   Hyperlipidemia    Insomnia    Liver lesion    MRI 2018   Migraines    Multiple sclerosis ([place])     Past Surgical History:  Procedure Laterality Date   ABDOMINAL HYSTERECTOMY  2004   partial d/t fibroid tumors   ANTERIOR CERVICAL DECOMP/DISCECTOMY FUSION  11/24/2011   Procedure: ANTERIOR CERVICAL DECOMPRESSION/DISCECTOMY FUSION 3 LEVELS;  Surgeon: Hosie Spangle, MD;  Location: [facility];  Service: Neurosurgery;  Laterality: N/A;  Cervical three-four, four-five,five-six anterior cervical decompression with fusion plating and bonegraft   BREAST SURGERY  2002   breast reduction   CERVICAL CONE BIOPSY  20+yrs ago   d/t cervical cancer   COLONOSCOPY WITH PROPOFOL N/A 04/27/2017   Dr. Gala Romney" congest mucosa likely secondary to cirrhosis. internal hemorrhoids.  otherwise normal.    ECTOPIC PREGNANCY SURGERY  1987/1989   ESOPHAGOGASTRODUODENOSCOPY (EGD) WITH PROPOFOL N/A 04/27/2017   Dr. Gala Romney: normal esophagus s/p dilation. small hiatal hernia, portal gastropathy, normal duodenum. No specimens collected. Surveillance in 2 years    HAND SURGERY Right 90's   cyst removed from top of hand   MALONEY DILATION N/A 04/27/2017   Procedure: Venia Minks DILATION;  Surgeon: Daneil Dolin, MD;  Location: [facility];  Service: Endoscopy;  Laterality: N/A;   TUBAL LIGATION      Current Outpatient Medications  Medication Sig Dispense Refill   acetaminophen (TYLENOL) 500 MG tablet Take 1,000 mg by mouth every 6 (six) hours as needed for moderate pain.     albuterol (PROVENTIL HFA;VENTOLIN HFA) 108 (90 Base) MCG/ACT inhaler Inhale 2 puffs into the lungs every 6 (six) hours as needed. For shortness of breath (Patient taking differently: Inhale 2 puffs into the lungs every 6 (six) hours as needed for wheezing or shortness of breath. For shortness of breath ) 8 g 0   aspirin 81 MG chewable tablet Chew daily by mouth.     cyclobenzaprine (FLEXERIL) 10 MG tablet Take 1 tablet (10 mg total) by mouth at bedtime. 30 tablet 1   diphenhydrAMINE (BENADRYL) 25 mg capsule Take 25 mg by mouth every 6 (six) hours as needed for itching.     gabapentin (NEURONTIN) 300 MG capsule Take 300 mg by mouth 3 (three) times daily.      HYDROcodone-acetaminophen (NORCO/VICODIN) 5-325 MG tablet Take 1 tablet by  mouth every 4 (four) hours as needed for severe pain. 20 tablet 0   Multiple Vitamin (MULITIVITAMIN WITH MINERALS) TABS Take 1 tablet by mouth daily.     naproxen (NAPROSYN) 500 MG tablet Take 500 mg by mouth 2 (two) times daily with a meal.     naproxen sodium (ANAPROX) 220 MG tablet Take 440 mg by mouth 2 (two) times daily as needed (pain).     nitroGLYCERIN (NITROSTAT) 0.4 MG SL tablet Place 1 tablet (0.4 mg total) under the tongue every 5 (five) minutes as needed. For  chest pain 30 tablet 0   Omega-3 1000 MG CAPS Take 1,000 mg by mouth daily.      pantoprazole (PROTONIX) 40 MG tablet Take 40 mg by mouth at bedtime.     potassium chloride (K-DUR) 10 MEQ tablet Take 2 tablets (20 mEq total) by mouth 2 (two) times daily for 2 days. 8 tablet 0   traMADol (ULTRAM) 50 MG tablet Take 1 tablet (50 mg total) every 6 (six) hours as needed by mouth. 15 tablet 0   No current facility-administered medications for this visit.    Allergies:  Codeine and Tomato   Social History: The patient  reports that she has been smoking cigarettes. She has a 32.00 pack-year smoking history. She has never used smokeless tobacco. She reports previous alcohol use of about 1.0 standard drinks of alcohol per week. She reports current drug use. Drug: Marijuana.   Family History: The patient's family history includes Alcoholism in her mother; Anemia in her daughter; Aneurysm in her sister; Arthritis in her father; Cancer in her father; Hypertension in her mother.   ROS:  Please see the history of present illness. Otherwise, complete review of systems is positive for {NONE DEFAULTED:18576::"none"}.  All other systems are reviewed and negative.   Physical Exam: VS:  There were no vitals taken for this visit., BMI There is no height or weight on file to calculate BMI.  Wt Readings from Last 3 Encounters:  03/15/18 118 lb (53.5 kg)  02/15/18 118 lb 3.2 oz (53.6 kg)  08/16/17 120 lb 6.4 oz (54.6 kg)    General: Patient appears comfortable at rest. HEENT: Conjunctiva and lids normal, oropharynx clear with moist mucosa. Neck: Supple, no elevated JVP or carotid bruits, no thyromegaly. Lungs: Clear to auscultation, nonlabored breathing at rest. Cardiac: Regular rate and rhythm, no S3 or significant systolic murmur, no pericardial rub. Abdomen: Soft, nontender, no hepatomegaly, bowel sounds present, no guarding or rebound. Extremities: No pitting edema, distal pulses 2+. Skin: Warm and  dry. Musculoskeletal: No kyphosis. Neuropsychiatric: Alert and oriented x3, affect grossly appropriate.  ECG: I personally reviewed the tracing from 06/23/2017 which showed sinus rhythm.  Recent Labwork: 02/15/2018: ALT 18; AST 20; BUN 9; Creat 0.75; Hemoglobin 12.0; Platelets 349; Potassium 3.8; Sodium 140  February 2020: BUN 16, creatinine 0.77, potassium 4.5, AST 24, ALT 23, hemoglobin 12.7, platelets 374  Other Studies Reviewed Today:  Cardiac catheterization 04/21/2010 [facility]): LVEF 65%.  Ostial 40% left circumflex stenosis, small nondominant RCA V fistula distally.  Echocardiogram 08/01/2016: Study Conclusions  - Left ventricle: The cavity size was normal. Wall thickness was   normal. Systolic function was normal. The estimated ejection   fraction was in the range of 60% to 65%. Wall motion was normal;   there were no regional wall motion abnormalities. Left   ventricular diastolic function parameters were normal. - Mitral valve: Mildly thickened leaflets . There was mild   regurgitation. - Left  atrium: The atrium was normal in size. - Tricuspid valve: There was mild regurgitation. - Pulmonary arteries: PA peak pressure: 30 mm Hg (S). - Inferior vena cava: The vessel was normal in size. The   respirophasic diameter changes were in the normal range (>= 50%),   consistent with normal central venous pressure.  Impressions:  - LVEF 60-65%, normal wall thickness, normal wall motion, normal   diastolic function, normal LA size, mild MR and TR, RVSP 30 mmHg,   normal IVC.  Assessment and Plan:   Current medicines were reviewed with the patient today.  No orders of the defined types were placed in this encounter.   Disposition:  Signed, Satira Sark, MD, [facility] 12/07/2018 9:35 AM    [facility] at [facility] [address] [place],  [address] Phone: [phone]; Fax: [phone]

## 2019-05-01 ENCOUNTER — Other Ambulatory Visit: Payer: Self-pay

## 2019-05-01 ENCOUNTER — Encounter (HOSPITAL_COMMUNITY): Payer: Self-pay

## 2019-05-01 ENCOUNTER — Emergency Department (HOSPITAL_COMMUNITY)
Admission: EM | Admit: 2019-05-01 | Discharge: 2019-05-01 | Disposition: A | Discharge status: Home / Self Care | Payer: Medicare PPO | Attending: Emergency Medicine | Admitting: Emergency Medicine

## 2019-05-01 DIAGNOSIS — F17210 Nicotine dependence, cigarettes, uncomplicated: Secondary | ICD-10-CM | POA: Diagnosis not present

## 2019-05-01 DIAGNOSIS — I10 Essential (primary) hypertension: Secondary | ICD-10-CM | POA: Diagnosis not present

## 2019-05-01 DIAGNOSIS — Z8673 Personal history of transient ischemic attack (TIA), and cerebral infarction without residual deficits: Secondary | ICD-10-CM | POA: Diagnosis not present

## 2019-05-01 DIAGNOSIS — J449 Chronic obstructive pulmonary disease, unspecified: Secondary | ICD-10-CM | POA: Insufficient documentation

## 2019-05-01 DIAGNOSIS — Z79899 Other long term (current) drug therapy: Secondary | ICD-10-CM | POA: Insufficient documentation

## 2019-05-01 DIAGNOSIS — Z7982 Long term (current) use of aspirin: Secondary | ICD-10-CM | POA: Diagnosis not present

## 2019-05-01 DIAGNOSIS — R252 Cramp and spasm: Secondary | ICD-10-CM | POA: Insufficient documentation

## 2019-05-01 DIAGNOSIS — E860 Dehydration: Secondary | ICD-10-CM | POA: Insufficient documentation

## 2019-05-01 LAB — COMPREHENSIVE METABOLIC PANEL
ALT: 23 U/L (ref 0–44)
AST: 34 U/L (ref 15–41)
Albumin: 4.9 g/dL (ref 3.5–5.0)
Alkaline Phosphatase: 74 U/L (ref 38–126)
Anion gap: 14 (ref 5–15)
BUN: 22 mg/dL — ABNORMAL HIGH (ref 6–20)
CO2: 24 mmol/L (ref 22–32)
Calcium: 10.3 mg/dL (ref 8.9–10.3)
Chloride: 94 mmol/L — ABNORMAL LOW (ref 98–111)
Creatinine, Ser: 0.98 mg/dL (ref 0.44–1.00)
GFR calc Af Amer: >60 mL/min (ref >60)
GFR calc non Af Amer: >60 mL/min (ref >60)
Glucose, Bld: 89 mg/dL (ref 70–99)
Potassium: 4.8 mmol/L (ref 3.5–5.1)
Sodium: 132 mmol/L — ABNORMAL LOW (ref 135–145)
Total Bilirubin: 0.7 mg/dL (ref 0.3–1.2)
Total Protein: 9.3 g/dL — ABNORMAL HIGH (ref 6.5–8.1)

## 2019-05-01 LAB — CBC WITH DIFFERENTIAL/PLATELET
Abs Immature Granulocytes: 0.08 10*3/uL — ABNORMAL HIGH (ref 0.00–0.07)
Basophils Absolute: 0.1 10*3/uL (ref 0.0–0.1)
Basophils Relative: 1 %
Eosinophils Absolute: 0.1 10*3/uL (ref 0.0–0.5)
Eosinophils Relative: 1 %
HCT: 45.7 % (ref 36.0–46.0)
Hemoglobin: 15 g/dL (ref 12.0–15.0)
Immature Granulocytes: 1 %
Lymphocytes Relative: 31 %
Lymphs Abs: 3.6 10*3/uL (ref 0.7–4.0)
MCH: 30.4 pg (ref 26.0–34.0)
MCHC: 32.8 g/dL (ref 30.0–36.0)
MCV: 92.7 fL (ref 80.0–100.0)
Monocytes Absolute: 0.7 10*3/uL (ref 0.1–1.0)
Monocytes Relative: 6 %
Neutro Abs: 7.4 10*3/uL (ref 1.7–7.7)
Neutrophils Relative %: 60 %
Platelets: 378 10*3/uL (ref 150–400)
RBC: 4.93 MIL/uL (ref 3.87–5.11)
RDW: 13.3 % (ref 11.5–15.5)
WBC: 12 10*3/uL — ABNORMAL HIGH (ref 4.0–10.5)
nRBC: 0 % (ref 0.0–0.2)

## 2019-05-01 LAB — URINALYSIS, ROUTINE W REFLEX MICROSCOPIC
Bacteria, UA: NONE SEEN
Bilirubin Urine: NEGATIVE
Glucose, UA: NEGATIVE mg/dL
Ketones, ur: NEGATIVE mg/dL
Leukocytes,Ua: NEGATIVE
Nitrite: NEGATIVE
Protein, ur: NEGATIVE mg/dL
Specific Gravity, Urine: 1.005 (ref 1.005–1.030)
pH: 5 (ref 5.0–8.0)

## 2019-05-01 LAB — MAGNESIUM: Magnesium: 2.1 mg/dL (ref 1.7–2.4)

## 2019-05-01 MED ORDER — METHOCARBAMOL 500 MG PO TABS: 500.0000 mg | ORAL_TABLET | Freq: Two times a day (BID) | ORAL | 0 refills | Status: DC | PRN

## 2019-05-01 MED ORDER — METHOCARBAMOL 500 MG PO TABS
500.0000 mg | ORAL_TABLET | Freq: Once | ORAL | Status: AC
  Administered 2019-05-01: 13:00:00 500 mg via ORAL
  Filled 2019-05-01: qty 1

## 2019-05-01 MED ORDER — SODIUM CHLORIDE 0.9 % IV BOLUS
1000.0000 mL | Freq: Once | INTRAVENOUS | Status: AC
  Administered 2019-05-01: 1000 mL via INTRAVENOUS

## 2019-05-01 NOTE — Discharge Instructions (Addendum)
Please make sure you are drinking at least 64 ounces of water daily.  If your cramps return you can also try drinking some Gatorade as this has electrolyte replenishment.  If muscle cramping returns please follow-up with your PCP or return to the emergency department.

## 2019-05-01 NOTE — ED Triage Notes (Signed)
Pt is having extremity cramping that has been going on for the last few days. Has also had diarrhea. Pt has been started on Potassium and Calcium supplements 2 weeks ago.

## 2019-05-01 NOTE — ED Provider Notes (Signed)
[facility] EMERGENCY DEPARTMENT Provider Note   CSN: [id number] Arrival date & time: 05/01/19  1213    History   Chief Complaint Chief Complaint  Patient presents with  . Generalized Body Aches    HPI Courtney Thornton is a 53 y.o. female.     Courtney Thornton is a 53 y.o. female with history of alcoholic cirrhosis, hyperlipidemia, HTN, MS, TIA, migraines, COPD, GERD who presents to ED for evaluation of cramping in her extremities.  Patient reports that for the past 2 days she has been experiencing muscle cramps that started in her hands and feet which she is also experiencing in her lower legs and arms.  She reports they come and go.  When a muscle cramp occurs it feels tight as if she cannot move it and she has to rub it for several minutes to get symptoms to ease off.  She denies any more generalized myalgias.  Denies any numbness or weakness. She has not had any fevers or chills, no known sick contacts.  She denies any associated chest pain or shortness of breath.  She has not had any abdominal pain, nausea or vomiting.  She does report that she has had some episodes of diarrhea over the past few days, she has had 3 today, they are loose but not significantly watery and she denies any associated melena or hematochezia.  She does report diarrhea started after she drank a large amount of milk to resolve an episode of constipation.  She denies any associated urinary symptoms.  She has not noted any skin changes, or rash.  No focal joint pain or swelling.  No other aggravating or alleviating factors.  Reports that her MS has been under good control with no recent flares, and she has been following with GI for her cirrhosis, has not required paracentesis.  She reports that she recently started taking potassium and calcium supplements, no other new medication changes.     Past Medical History:  Diagnosis Date  . Anxiety and depression   . Cervical cancer ([place])   . COPD (chronic  obstructive pulmonary disease) ([place])   . Essential hypertension   . GERD (gastroesophageal reflux disease)   . Headache(784.0)   . Hepatic cirrhosis ([place])   . History of alcohol abuse   . History of blood transfusion   . History of cardiac catheterization    Mild, nonobstructive circumflex disease 2011 - [facility]  . History of kidney stones   . History of TIA (transient ischemic attack)    Per patient report  . Hyperlipidemia   . Insomnia   . Liver lesion    MRI 2018  . Migraines   . Multiple sclerosis [facility])     Patient Active Problem List   Diagnosis Date Noted  . Hepatic cirrhosis ([place]) 03/23/2017  . Liver lesion 03/23/2017  . Dysphagia 03/23/2017  . Rectal bleeding 03/23/2017  . Adult failure to thrive 08/12/2016  . Change in bowel movement 08/12/2016  . Subacute confusional state 08/12/2016  . Leukocytosis 08/06/2016  . Hypokalemia 07/31/2016  . Hyponatremia 07/31/2016  . Alcohol abuse 07/31/2016  . Anemia 07/31/2016  . Diarrhea 07/31/2016  . Protein-calorie malnutrition, severe ([place]) 07/31/2016  . HTN (hypertension) 07/31/2016  . GERD (gastroesophageal reflux disease) 07/31/2016    Past Surgical History:  Procedure Laterality Date  . ABDOMINAL HYSTERECTOMY  2004   partial d/t fibroid tumors  . ANTERIOR CERVICAL DECOMP/DISCECTOMY FUSION  11/24/2011   Procedure: ANTERIOR CERVICAL DECOMPRESSION/DISCECTOMY FUSION 3 LEVELS;  Surgeon: Hosie Spangle, MD;  Location: [place] [facility];  Service: Neurosurgery;  Laterality: N/A;  Cervical three-four, four-five,five-six anterior cervical decompression with fusion plating and bonegraft  . BREAST SURGERY  2002   breast reduction  . CERVICAL CONE BIOPSY  20+yrs ago   d/t cervical cancer  . COLONOSCOPY WITH PROPOFOL N/A 04/27/2017   Dr. Gala Romney" congest mucosa likely secondary to cirrhosis. internal hemorrhoids. otherwise normal.   . ECTOPIC PREGNANCY SURGERY  1987/1989  . ESOPHAGOGASTRODUODENOSCOPY (EGD) WITH PROPOFOL N/A 04/27/2017    Dr. Gala Romney: normal esophagus s/p dilation. small hiatal hernia, portal gastropathy, normal duodenum. No specimens collected. Surveillance in 2 years   . HAND SURGERY Right 90's   cyst removed from top of hand  . MALONEY DILATION N/A 04/27/2017   Procedure: Venia Minks DILATION;  Surgeon: Daneil Dolin, MD;  [facility];  Service: Endoscopy;  Laterality: N/A;  . TUBAL LIGATION       OB History    Gravida  3   Para  1   Term      Preterm      AB  2   Living  1     SAB      TAB      Ectopic  2   Multiple      Live Births               Home Medications    Prior to Admission medications   Medication Sig Start Date End Date Taking? Authorizing Provider  acetaminophen (TYLENOL) 500 MG tablet Take 1,000 mg by mouth every 6 (six) hours as needed for moderate pain.    [provider]  albuterol (PROVENTIL HFA;VENTOLIN HFA) 108 (90 Base) MCG/ACT inhaler Inhale 2 puffs into the lungs every 6 (six) hours as needed. For shortness of breath Patient taking differently: Inhale 2 puffs into the lungs every 6 (six) hours as needed for wheezing or shortness of breath. For shortness of breath  09/01/16   Lauree Chandler, NP  aspirin 81 MG chewable tablet Chew daily by mouth.    [provider]  cyclobenzaprine (FLEXERIL) 10 MG tablet Take 1 tablet (10 mg total) by mouth at bedtime. 04/28/17   Sanjuana Kava, MD  diphenhydrAMINE (BENADRYL) 25 mg capsule Take 25 mg by mouth every 6 (six) hours as needed for itching.    [provider]  gabapentin (NEURONTIN) 300 MG capsule Take 300 mg by mouth 3 (three) times daily.     [provider]  HYDROcodone-acetaminophen (NORCO/VICODIN) 5-325 MG tablet Take 1 tablet by mouth every 4 (four) hours as needed for severe pain. 03/15/18   Mesner, Corene Cornea, MD  Multiple Vitamin (MULITIVITAMIN WITH MINERALS) TABS Take 1 tablet by mouth daily.    [provider]  naproxen (NAPROSYN) 500 MG tablet Take 500  mg by mouth 2 (two) times daily with a meal.    [provider]  naproxen sodium (ANAPROX) 220 MG tablet Take 440 mg by mouth 2 (two) times daily as needed (pain).    [provider]  nitroGLYCERIN (NITROSTAT) 0.4 MG SL tablet Place 1 tablet (0.4 mg total) under the tongue every 5 (five) minutes as needed. For chest pain 09/01/16   Lauree Chandler, NP  Omega-3 1000 MG CAPS Take 1,000 mg by mouth daily.     [provider]  pantoprazole (PROTONIX) 40 MG tablet Take 40 mg by mouth at bedtime.    [provider]  potassium chloride (K-DUR)  10 MEQ tablet Take 2 tablets (20 mEq total) by mouth 2 (two) times daily for 2 days. 08/18/17 08/20/17  Annitta Needs, NP  traMADol (ULTRAM) 50 MG tablet Take 1 tablet (50 mg total) every 6 (six) hours as needed by mouth. 07/17/17   Lily Kocher, PA-C    Family History Family History  Problem Relation Age of Onset  . Arthritis Father   . Cancer Father   . Alcoholism Mother   . Hypertension Mother   . Aneurysm Sister   . Anemia Daughter   . Anesthesia problems Neg Hx   . Hypotension Neg Hx   . Malignant hyperthermia Neg Hx   . Pseudochol deficiency Neg Hx   . Colon cancer Neg Hx   . Colon polyps Neg Hx     Social History Social History   Tobacco Use  . Smoking status: Current Every Day Smoker    Packs/day: 1.00    Years: 32.00    Pack years: 32.00    Types: Cigarettes  . Smokeless tobacco: Never Used  Substance Use Topics  . Alcohol use: Not Currently    Alcohol/week: 1.0 standard drinks    Types: 1 Cans of beer per week    Comment: History of intermittent alcohol use  . Drug use: Yes    Types: Marijuana    Comment: pt states she was told to use this for weight gain      Allergies   Codeine and Tomato   Review of Systems Review of Systems  Constitutional: Negative for chills and fever.  HENT: Negative.   Eyes: Negative for visual disturbance.  Respiratory: Negative for cough and shortness of  breath.   Cardiovascular: Negative for chest pain.  Gastrointestinal: Positive for diarrhea. Negative for abdominal pain, blood in stool, nausea and vomiting.  Genitourinary: Negative for dysuria and frequency.  Musculoskeletal: Positive for myalgias. Negative for arthralgias, back pain and neck pain.  Skin: Negative for color change and rash.  Neurological: Negative for weakness, numbness and headaches.     Physical Exam Updated Vital Signs BP (!) 136/106 (BP Location: Right Arm)   Pulse (!) 107   Temp 98.1 F (36.7 C) (Oral)   Resp 18   Ht 5' (1.524 m)   Wt 57.6 kg   SpO2 99%   BMI 24.80 kg/m   Physical Exam Vitals signs and nursing note reviewed.  Constitutional:      General: She is not in acute distress.    Appearance: Normal appearance. She is well-developed and normal weight. She is not ill-appearing or diaphoretic.     Comments: Sitting comfortably, well-appearing and in no acute distress.  HENT:     Head: Normocephalic and atraumatic.     Mouth/Throat:     Mouth: Mucous membranes are moist.     Pharynx: Oropharynx is clear.  Eyes:     General:        Right eye: No discharge.        Left eye: No discharge.     Extraocular Movements: Extraocular movements intact.     Conjunctiva/sclera: Conjunctivae normal.     Pupils: Pupils are equal, round, and reactive to light.  Neck:     Musculoskeletal: Neck supple.  Cardiovascular:     Rate and Rhythm: Normal rate and regular rhythm.     Pulses: Normal pulses.          Radial pulses are 2+ on the right side and 2+ on the left side.  Dorsalis pedis pulses are 2+ on the right side and 2+ on the left side.     Heart sounds: Normal heart sounds. No murmur. No friction rub. No gallop.   Pulmonary:     Effort: Pulmonary effort is normal. No respiratory distress.     Breath sounds: Normal breath sounds. No wheezing or rales.  Abdominal:     General: Bowel sounds are normal. There is no distension.     Palpations:  Abdomen is soft. There is no mass.     Tenderness: There is no abdominal tenderness. There is no guarding.  Musculoskeletal:        General: No deformity.     Comments: All joints supple and easily movable without swelling or deformity, all compartments soft.  Skin:    General: Skin is warm and dry.     Capillary Refill: Capillary refill takes less than 2 seconds.  Neurological:     Mental Status: She is alert and oriented to person, place, and time.     Coordination: Coordination normal.     Gait: Gait normal.     Comments: Speech is clear, able to follow commands CN III-XII intact Normal strength in upper and lower extremities bilaterally including dorsiflexion and plantar flexion, strong and equal grip strength Sensation normal to light and sharp touch Moves extremities without ataxia, coordination intact No pronator drift Ambulatory with steady gait  Psychiatric:        Mood and Affect: Mood normal.        Behavior: Behavior normal.      ED Treatments / Results  Labs (all labs ordered are listed, but only abnormal results are displayed) Labs Reviewed  COMPREHENSIVE METABOLIC PANEL - Abnormal; Notable for the following components:      Result Value   Sodium 132 (*)    Chloride 94 (*)    BUN 22 (*)    Total Protein 9.3 (*)    All other components within normal limits  CBC WITH DIFFERENTIAL/PLATELET - Abnormal; Notable for the following components:   WBC 12.0 (*)    Abs Immature Granulocytes 0.08 (*)    All other components within normal limits  URINALYSIS, ROUTINE W REFLEX MICROSCOPIC - Abnormal; Notable for the following components:   Color, Urine STRAW (*)    Hgb urine dipstick MODERATE (*)    All other components within normal limits  MAGNESIUM    EKG None  Radiology No results found.  Procedures Procedures (including critical care time)  Medications Ordered in ED Medications  methocarbamol (ROBAXIN) tablet 500 mg (500 mg Oral Given 05/01/19 1255)   sodium chloride 0.9 % bolus 1,000 mL (1,000 mLs Intravenous New Bag/Given 05/01/19 1402)     Initial Impression / Assessment and Plan / ED Course  I have reviewed the triage vital signs and the nursing notes.  Pertinent labs & imaging results that were available during my care of the patient were reviewed by me and considered in my medical decision making (see chart for details).  53 year old female presents with 2 days of intermittent cramping in her extremities.  She has had some associated mild diarrhea, no associated fevers, abdominal pain or sick contacts.  No numbness or weakness.  On arrival patient is mildly tachycardic but all other vitals are normal.  She is very well-appearing.  She has no focal neurologic deficits, lungs are clear, heart RRR, no abdominal tenderness.  No recent MS flares and cirrhosis is been well managed, no evidence of ascites, and no  blood in stools.  Suspect muscle cramping may be due to electrolyte abnormality.  Will check electrolytes, CBC, magnesium and urinalysis.  Will give Robaxin for muscle cramps while awaiting results.  Diarrhea started after patient drank a large amount of dairy to help resolve episode of constipation, she has no abdominal tenderness, do not feel this requires further work-up at this time.  Could certainly contribute to possible hypokalemia.  Labs overall have been reassuring, normal hemoglobin, mild leukocytosis of 12.0 with no other infectious symptoms, patient does have mild hyponatremia of 132, chloride 94, and her creatinine is up slightly from baseline of about 0.7, to 0.98 with elevation in BUN which suggest some element of dehydration which could certainly be contributing to muscle cramping.  She has normal potassium and calcium, magnesium normal as well, normal liver function.  UA without signs of infection. Have low suspicion for MS flare and cramping is present intermittently in all extremities and is non-focal. Will give IV fluids.   After fluids and Robaxin cramps have completely resolved suspect this was related to dehydration.  I counseled patient on increasing her water intake.  Will prescribe short course of Robaxin but if muscle cramping returns patient should follow-up with PCP.  Return precautions discussed.  Discharged home in good condition.  Case discussed with Dr. Alvino Chapel who helped guide patient's care, and is in agreement with plan.  Final Clinical Impressions(s) / ED Diagnoses   Final diagnoses:  Muscle cramps  Dehydration    ED Discharge Orders         Ordered    methocarbamol (ROBAXIN) 500 MG tablet  2 times daily PRN     05/01/19 1619           Jacqlyn Larsen, PA-C 05/01/19 1620    Davonna Belling, MD 05/02/19 1506

## 2019-05-28 ENCOUNTER — Encounter: Payer: Self-pay | Admitting: Internal Medicine

## 2020-03-25 ENCOUNTER — Other Ambulatory Visit: Payer: Medicare PPO | Admitting: Obstetrics & Gynecology

## 2020-06-12 ENCOUNTER — Encounter: Payer: Self-pay | Admitting: *Deleted

## 2020-06-12 ENCOUNTER — Encounter: Payer: Self-pay | Admitting: Cardiology

## 2020-06-12 ENCOUNTER — Ambulatory Visit (INDEPENDENT_AMBULATORY_CARE_PROVIDER_SITE_OTHER): Payer: Medicare (Managed Care) | Admitting: Cardiology

## 2020-06-12 DIAGNOSIS — I2510 Atherosclerotic heart disease of native coronary artery without angina pectoris: Secondary | ICD-10-CM | POA: Diagnosis not present

## 2020-06-12 DIAGNOSIS — R079 Chest pain, unspecified: Secondary | ICD-10-CM

## 2020-06-12 NOTE — Progress Notes (Signed)
Clinical Summary Courtney Thornton is a 54 y.o.female seen today for follow up of the following medical problems.   1. CAD - she reports having what she thought was an MI in 2012, had a cath at that time but no stent at [facility] - difficult to review records, was able to find a hand written cath note from 04/2010 that showed 40% ostial LCX and no other disease.   - recent chest pain. Pressure left chest into back, 7/10 in severity. +SOB +diaphoretic, feels lightheaded. Mainly with activity. Not positional. Lasts a few minutes. Better with NG in 30 minutes.  - symptoms started 1 year ago, increase in frequency.  - gets SOB, some chest pains with housework. Can have palpitations.      Past Medical History:  Diagnosis Date  . Anxiety and depression   . Cervical cancer ([place])   . COPD (chronic obstructive pulmonary disease) ([place])   . Essential hypertension   . GERD (gastroesophageal reflux disease)   . Headache(784.0)   . Hepatic cirrhosis ([place])   . History of alcohol abuse   . History of blood transfusion   . History of cardiac catheterization    Mild, nonobstructive circumflex disease 2011 - [facility]  . History of kidney stones   . History of TIA (transient ischemic attack)    Per patient report  . Hyperlipidemia   . Insomnia   . Liver lesion    MRI 2018  . Migraines   . Multiple sclerosis ([facility])      Allergies  Allergen Reactions  . Codeine Nausea And Vomiting  . Tomato Rash     Current Outpatient Medications  Medication Sig Dispense Refill  . acetaminophen (TYLENOL) 500 MG tablet Take 1,000 mg by mouth every 6 (six) hours as needed for moderate pain.    Marland Kitchen albuterol (PROVENTIL HFA;VENTOLIN HFA) 108 (90 Base) MCG/ACT inhaler Inhale 2 puffs into the lungs every 6 (six) hours as needed. For shortness of breath (Patient taking differently: Inhale 2 puffs into the lungs every 6 (six) hours as needed for wheezing or shortness of breath. For shortness of breath ) 8 g 0  .  aspirin 81 MG chewable tablet Chew 81 mg by mouth daily.     . citalopram (CELEXA) 20 MG tablet Take 20 mg by mouth daily.    . cyclobenzaprine (FLEXERIL) 10 MG tablet Take 1 tablet (10 mg total) by mouth at bedtime. (Patient taking differently: Take 10 mg by mouth daily as needed (sleep). ) 30 tablet 1  . diphenhydrAMINE (BENADRYL) 25 mg capsule Take 25 mg by mouth every 6 (six) hours as needed for itching.    . Ferrous Sulfate (IRON PO) Take 1 tablet by mouth daily.    Marland Kitchen gabapentin (NEURONTIN) 300 MG capsule Take 300 mg by mouth 3 (three) times daily.     . methocarbamol (ROBAXIN) 500 MG tablet Take 1 tablet (500 mg total) by mouth 2 (two) times daily as needed for muscle spasms. 10 tablet 0  . Multiple Vitamin (MULITIVITAMIN WITH MINERALS) TABS Take 1 tablet by mouth daily.    . naproxen (NAPROSYN) 500 MG tablet Take 500 mg by mouth 2 (two) times daily with a meal.    . naproxen sodium (ANAPROX) 220 MG tablet Take 440 mg by mouth 2 (two) times daily as needed (pain).    . nitroGLYCERIN (NITROSTAT) 0.4 MG SL tablet Place 1 tablet (0.4 mg total) under the tongue every 5 (five) minutes as needed. For  chest pain 30 tablet 0  . Omega-3 1000 MG CAPS Take 1,000 mg by mouth daily.     Marland Kitchen oxymetazoline (AFRIN) 0.05 % nasal spray Place 1 spray into both nostrils 2 (two) times daily as needed for congestion (for nose bleeds).    . pantoprazole (PROTONIX) 40 MG tablet Take 40 mg by mouth at bedtime.    . potassium chloride (K-DUR) 10 MEQ tablet Take 2 tablets (20 mEq total) by mouth 2 (two) times daily for 2 days. 8 tablet 0  . POTASSIUM PO Take 1 tablet by mouth daily.    Marland Kitchen spironolactone (ALDACTONE) 50 MG tablet Take 50 mg by mouth 2 (two) times daily.     . traMADol (ULTRAM) 50 MG tablet Take 1 tablet (50 mg total) every 6 (six) hours as needed by mouth. 15 tablet 0  . traZODone (DESYREL) 100 MG tablet Take 100 mg by mouth at bedtime.     No current facility-administered medications for this visit.      Past Surgical History:  Procedure Laterality Date  . ABDOMINAL HYSTERECTOMY  2004   partial d/t fibroid tumors  . ANTERIOR CERVICAL DECOMP/DISCECTOMY FUSION  11/24/2011   Procedure: ANTERIOR CERVICAL DECOMPRESSION/DISCECTOMY FUSION 3 LEVELS;  Surgeon: Hosie Spangle, MD;  Location: [place] [facility];  Service: Neurosurgery;  Laterality: N/A;  Cervical three-four, four-five,five-six anterior cervical decompression with fusion plating and bonegraft  . BREAST SURGERY  2002   breast reduction  . CERVICAL CONE BIOPSY  20+yrs ago   d/t cervical cancer  . COLONOSCOPY WITH PROPOFOL N/A 04/27/2017   Dr. Gala Romney" congest mucosa likely secondary to cirrhosis. internal hemorrhoids. otherwise normal.   . ECTOPIC PREGNANCY SURGERY  1987/1989  . ESOPHAGOGASTRODUODENOSCOPY (EGD) WITH PROPOFOL N/A 04/27/2017   Dr. Gala Romney: normal esophagus s/p dilation. small hiatal hernia, portal gastropathy, normal duodenum. No specimens collected. Surveillance in 2 years   . HAND SURGERY Right 90's   cyst removed from top of hand  . MALONEY DILATION N/A 04/27/2017   Procedure: Venia Minks DILATION;  Surgeon: Daneil Dolin, MD;  [facility];  Service: Endoscopy;  Laterality: N/A;  . TUBAL LIGATION       Allergies  Allergen Reactions  . Codeine Nausea And Vomiting  . Tomato Rash      Family History  Problem Relation Age of Onset  . Arthritis Father   . Cancer Father   . Alcoholism Mother   . Hypertension Mother   . Aneurysm Sister   . Anemia Daughter   . Anesthesia problems Neg Hx   . Hypotension Neg Hx   . Malignant hyperthermia Neg Hx   . Pseudochol deficiency Neg Hx   . Colon cancer Neg Hx   . Colon polyps Neg Hx      Social History Courtney Thornton reports that she has been smoking cigarettes. She has a 32.00 pack-year smoking history. She has never used smokeless tobacco. Courtney Thornton reports previous alcohol use of about 1.0 standard drink of alcohol per week.   Review of  Systems CONSTITUTIONAL: No weight loss, fever, chills, weakness or fatigue.  HEENT: Eyes: No visual loss, blurred vision, double vision or yellow sclerae.No hearing loss, sneezing, congestion, runny nose or sore throat.  SKIN: No rash or itching.  CARDIOVASCULAR: per hpi RESPIRATORY: No shortness of breath, cough or sputum.  GASTROINTESTINAL: No anorexia, nausea, vomiting or diarrhea. No abdominal pain or blood.  GENITOURINARY: No burning on urination, no polyuria NEUROLOGICAL: No headache, dizziness, syncope, paralysis, ataxia, numbness or  tingling in the extremities. No change in bowel or bladder control.  MUSCULOSKELETAL: No muscle, back pain, joint pain or stiffness.  LYMPHATICS: No enlarged nodes. No history of splenectomy.  PSYCHIATRIC: No history of depression or anxiety.  ENDOCRINOLOGIC: No reports of sweating, cold or heat intolerance. No polyuria or polydipsia.  Marland Kitchen   Physical Examination Today's Vitals   06/12/20 0923  BP: 138/78  Pulse: (!) 108  SpO2: 98%  Weight: 128 lb (58.1 kg)  Height: 5' (1.524 m)   Body mass index is 25 kg/m.  Gen: resting comfortably, no acute distress HEENT: no scleral icterus, pupils equal round and reactive, no palptable cervical adenopathy,  CV: RRR, no m/r/g, no jvd Resp: Clear to auscultation bilaterally GI: abdomen is soft, non-tender, non-distended, normal bowel sounds, no hepatosplenomegaly MSK: extremities are warm, no edema.  Skin: warm, no rash Neuro:  no focal deficits Psych: appropriate affect   Diagnostic Studies 2017 echo Study Conclusions   - Left ventricle: The cavity size was normal. Wall thickness was  normal. Systolic function was normal. The estimated ejection  fraction was in the range of 60% to 65%. Wall motion was normal;  there were no regional wall motion abnormalities. Left  ventricular diastolic function parameters were normal.  - Mitral valve: Mildly thickened leaflets . There was mild   regurgitation.  - Left atrium: The atrium was normal in size.  - Tricuspid valve: There was mild regurgitation.  - Pulmonary arteries: PA peak pressure: 30 mm Hg (S).  - Inferior vena cava: The vessel was normal in size. The  respirophasic diameter changes were in the normal range (>= 50%),  consistent with normal central venous pressure    Assessment and Plan  1. CAD/Chest pain - long history of intermitent chest pains, cath 2011 showed 40% ostial LCX without other disease - recent progression of symptoms - will obtain lexiscan to further evalaute EKG today shows mild sinus tach, no ischemic changes     F/u 1 month   Arnoldo Lenis, M.D.

## 2020-06-12 NOTE — Patient Instructions (Addendum)
Your physician recommends that you schedule a follow-up appointment in: [place] [facility]   Your physician recommends that you continue on your current medications as directed. Please refer to the Current Medication list given to you today.  Your physician has requested that you have a lexiscan myoview. For further information please visit [email or web address]. Please follow instruction sheet, as given.  Thank you for choosing [facility]!!

## 2020-06-12 NOTE — Addendum Note (Signed)
Addended by: Julian Hy T on: 06/12/2020 03:53 PM   Modules accepted: Orders

## 2020-06-16 ENCOUNTER — Encounter (HOSPITAL_COMMUNITY): Admission: RE | Admit: 2020-06-16 | Payer: Medicare (Managed Care) | Source: Ambulatory Visit

## 2020-06-16 ENCOUNTER — Other Ambulatory Visit: Payer: Self-pay

## 2020-06-16 ENCOUNTER — Encounter (HOSPITAL_COMMUNITY)
Admission: RE | Admit: 2020-06-16 | Discharge: 2020-06-16 | Disposition: A | Discharge status: Home / Self Care | Payer: Medicare (Managed Care) | Source: Ambulatory Visit | Attending: Cardiology | Admitting: Cardiology

## 2020-06-16 DIAGNOSIS — R079 Chest pain, unspecified: Secondary | ICD-10-CM

## 2020-06-20 ENCOUNTER — Ambulatory Visit (HOSPITAL_BASED_OUTPATIENT_CLINIC_OR_DEPARTMENT_OTHER)
Admission: RE | Admit: 2020-06-20 | Discharge: 2020-06-20 | Disposition: A | Discharge status: Home / Self Care | Payer: Medicare (Managed Care) | Source: Ambulatory Visit | Attending: Cardiology | Admitting: Cardiology

## 2020-06-20 ENCOUNTER — Ambulatory Visit (HOSPITAL_COMMUNITY)
Admission: RE | Admit: 2020-06-20 | Discharge: 2020-06-20 | Disposition: A | Discharge status: Home / Self Care | Payer: Medicare (Managed Care) | Source: Ambulatory Visit | Attending: Dermatology | Admitting: Dermatology

## 2020-06-20 ENCOUNTER — Encounter (HOSPITAL_COMMUNITY): Payer: Self-pay

## 2020-06-20 ENCOUNTER — Other Ambulatory Visit: Payer: Self-pay

## 2020-06-20 DIAGNOSIS — R079 Chest pain, unspecified: Secondary | ICD-10-CM

## 2020-06-20 HISTORY — DX: Systemic involvement of connective tissue, unspecified: M35.9

## 2020-06-20 LAB — NM MYOCAR MULTI W/SPECT W/WALL MOTION / EF
LV dias vol: 60 mL (ref 46–106)
LV sys vol: 24 mL
Peak HR: 109 {beats}/min
RATE: 0.31
Rest HR: 75 {beats}/min
SDS: 7
SRS: 1
SSS: 8
TID: 1.2

## 2020-06-20 MED ORDER — TECHNETIUM TC 99M TETROFOSMIN IV KIT
30.0000 | PACK | Freq: Once | INTRAVENOUS | Status: AC | PRN
  Administered 2020-06-20: 30.2 via INTRAVENOUS

## 2020-06-20 MED ORDER — REGADENOSON 0.4 MG/5ML IV SOLN
INTRAVENOUS | Status: AC
  Administered 2020-06-20: 0.4 mg via INTRAVENOUS
  Filled 2020-06-20: qty 5

## 2020-06-20 MED ORDER — SODIUM CHLORIDE FLUSH 0.9 % IV SOLN
INTRAVENOUS | Status: AC
  Administered 2020-06-20: 10 mL via INTRAVENOUS
  Filled 2020-06-20: qty 10

## 2020-06-20 MED ORDER — TECHNETIUM TC 99M TETROFOSMIN IV KIT
10.0000 | PACK | Freq: Once | INTRAVENOUS | Status: AC | PRN
  Administered 2020-06-20: 10 via INTRAVENOUS

## 2020-06-26 ENCOUNTER — Telehealth: Payer: Self-pay | Admitting: Family Medicine

## 2020-06-26 NOTE — Telephone Encounter (Signed)
Results given of stress test to patient.Copied pcp

## 2020-06-26 NOTE — Telephone Encounter (Signed)
New message     Patient returning a call to [place] ?

## 2020-07-09 NOTE — Progress Notes (Signed)
Cardiology Office Note  Date: 07/10/2020   ID: Courtney Thornton, DOB 03-17-1966, MRN [id number]  PCP:  Vidal Schwalbe, MD  Cardiologist:  Carlyle Dolly, MD Electrophysiologist:  None    Chief Complaint: Chest pain unspecified  History of Present Illness: Courtney Thornton is a 54 y.o. female with a history of chest pain, collagen vascular disease, COPD, HTN, GERD, TIA, cirrhosis, alcohol abuse, HLD, MS, migraines.  Last office visit with Dr. Harl Bowie 06/12/2020: Recent CP with pressure in left chest and back 7/10 in severity, with dyspnea, diaphoresis, lightheadedness with activity.  Lasting a few minutes, better with nitroglycerin after 30 minutes.  Symptom onset 1 year ago increasing in frequency, occurring with housework and associated palpitations.  Dr. Harl Bowie noted a long history of intermittent chest pain.  Cardiac catheterization 2011 showed 40% ostial LCx without other disease.  Lexiscan Myoview was ordered.  Presents for follow-up status post recent Lexiscan stress test results.  We discussed the fact that [place] stress test was low risk and considered negative.  She continues to complain of intermittent chest pain which she describes as a squeezing sensation.  Occasionally feels some nausea and sweatiness when this occurs.  She states her primary care physician has referred her to [facility] gastroenterology and she will be seeing them soon.  Otherwise denies any exertional symptoms.,  Palpitations or arrhythmias, CVA or TIA-like symptoms, PND, orthopnea, bleeding.  No lower extremity edema, DVT or PE-like symptoms, or complaints of claudication-like symptoms.  Past Medical History:  Diagnosis Date  . Anxiety and depression   . Cervical cancer ([place])   . Collagen vascular disease ([place])   . COPD (chronic obstructive pulmonary disease) ([place])   . Essential hypertension   . GERD (gastroesophageal reflux disease)   . Headache(784.0)   . Hepatic cirrhosis ([place])   . History  of alcohol abuse   . History of blood transfusion   . History of cardiac catheterization    Mild, nonobstructive circumflex disease 2011 - [facility]  . History of kidney stones   . History of TIA (transient ischemic attack)    Per patient report  . Hyperlipidemia   . Insomnia   . Liver lesion    MRI 2018  . Migraines   . Multiple sclerosis ([place])     Past Surgical History:  Procedure Laterality Date  . ABDOMINAL HYSTERECTOMY  2004   partial d/t fibroid tumors  . ANTERIOR CERVICAL DECOMP/DISCECTOMY FUSION  11/24/2011   Procedure: ANTERIOR CERVICAL DECOMPRESSION/DISCECTOMY FUSION 3 LEVELS;  Surgeon: Hosie Spangle, MD;  Location: [facility];  Service: Neurosurgery;  Laterality: N/A;  Cervical three-four, four-five,five-six anterior cervical decompression with fusion plating and bonegraft  . BREAST SURGERY  2002   breast reduction  . CERVICAL CONE BIOPSY  20+yrs ago   d/t cervical cancer  . COLONOSCOPY WITH PROPOFOL N/A 04/27/2017   Dr. Gala Romney" congest mucosa likely secondary to cirrhosis. internal hemorrhoids. otherwise normal.   . ECTOPIC PREGNANCY SURGERY  1987/1989  . ESOPHAGOGASTRODUODENOSCOPY (EGD) WITH PROPOFOL N/A 04/27/2017   Dr. Gala Romney: normal esophagus s/p dilation. small hiatal hernia, portal gastropathy, normal duodenum. No specimens collected. Surveillance in 2 years   . HAND SURGERY Right 90's   cyst removed from top of hand  . MALONEY DILATION N/A 04/27/2017   Procedure: Venia Minks DILATION;  Surgeon: Daneil Dolin, MD;  [facility];  Service: Endoscopy;  Laterality: N/A;  . TUBAL LIGATION      Current Outpatient Medications  Medication Sig Dispense  Refill  . acetaminophen (TYLENOL) 500 MG tablet Take 1,000 mg by mouth every 6 (six) hours as needed for moderate pain.    Marland Kitchen albuterol (PROVENTIL HFA;VENTOLIN HFA) 108 (90 Base) MCG/ACT inhaler Inhale 2 puffs into the lungs every 6 (six) hours as needed. For shortness of breath (Patient taking differently: Inhale 2  puffs into the lungs every 6 (six) hours as needed for wheezing or shortness of breath. For shortness of breath ) 8 g 0  . aspirin 81 MG chewable tablet Chew 81 mg by mouth daily.     Marland Kitchen buPROPion (ZYBAN) 150 MG 12 hr tablet Take 150 mg by mouth 2 (two) times daily.    . cyclobenzaprine (FLEXERIL) 10 MG tablet Take 1 tablet (10 mg total) by mouth at bedtime. (Patient taking differently: Take 10 mg by mouth daily as needed (sleep). ) 30 tablet 1  . diphenhydrAMINE (BENADRYL) 25 mg capsule Take 25 mg by mouth every 6 (six) hours as needed for itching.    . DULERA 200-5 MCG/ACT AERO Inhale 2 puffs into the lungs 2 (two) times daily.    . Ferrous Sulfate (IRON PO) Take 1 tablet by mouth daily.    Marland Kitchen gabapentin (NEURONTIN) 300 MG capsule Take 300 mg by mouth 3 (three) times daily.     . methocarbamol (ROBAXIN) 500 MG tablet Take 1 tablet (500 mg total) by mouth 2 (two) times daily as needed for muscle spasms. 10 tablet 0  . Multiple Vitamin (MULITIVITAMIN WITH MINERALS) TABS Take 1 tablet by mouth daily.    . naproxen (NAPROSYN) 500 MG tablet Take 500 mg by mouth 2 (two) times daily with a meal.    . naproxen sodium (ANAPROX) 220 MG tablet Take 440 mg by mouth 2 (two) times daily as needed (pain).    . nitroGLYCERIN (NITROSTAT) 0.4 MG SL tablet Place 1 tablet (0.4 mg total) under the tongue every 5 (five) minutes as needed. For chest pain 30 tablet 0  . Omega-3 1000 MG CAPS Take 1,000 mg by mouth daily.     Marland Kitchen oxymetazoline (AFRIN) 0.05 % nasal spray Place 1 spray into both nostrils 2 (two) times daily as needed for congestion (for nose bleeds).    . pantoprazole (PROTONIX) 40 MG tablet Take 40 mg by mouth at bedtime.    Marland Kitchen POTASSIUM PO Take 1 tablet by mouth daily as needed. OTC & Takes sometimes    . rosuvastatin (CRESTOR) 20 MG tablet Take 20 mg by mouth at bedtime.    Marland Kitchen spironolactone (ALDACTONE) 50 MG tablet Take 50 mg by mouth 2 (two) times daily.     . traMADol (ULTRAM) 50 MG tablet Take 1 tablet  (50 mg total) every 6 (six) hours as needed by mouth. 15 tablet 0  . traZODone (DESYREL) 100 MG tablet Take 100 mg by mouth at bedtime.     No current facility-administered medications for this visit.   Allergies:  Codeine   Social History: The patient  reports that she has been smoking cigarettes. She has a 32.00 pack-year smoking history. She has never used smokeless tobacco. She reports previous alcohol use of about 1.0 standard drink of alcohol per week. She reports current drug use. Drug: Marijuana.   Family History: The patient's family history includes Alcoholism in her mother; Anemia in her daughter; Aneurysm in her sister; Arthritis in her father; Cancer in her father; Hypertension in her mother.   ROS:  Please see the history of present illness. Otherwise, complete review of  systems is positive for none.  All other systems are reviewed and negative.   Physical Exam: VS:  BP 118/80   Pulse 72   Ht 5' (1.524 m)   Wt 127 lb (57.6 kg)   SpO2 98%   BMI 24.80 kg/m , BMI Body mass index is 24.8 kg/m.  Wt Readings from Last 3 Encounters:  07/10/20 127 lb (57.6 kg)  06/12/20 128 lb (58.1 kg)  05/01/19 127 lb (57.6 kg)    General: Patient appears comfortable at rest. Neck: Supple, no elevated JVP or carotid bruits, no thyromegaly. Lungs: Clear to auscultation, nonlabored breathing at rest. Cardiac: Regular rate and rhythm, no S3 or significant systolic murmur, no pericardial rub. Extremities: No pitting edema, distal pulses 2+. Skin: Warm and dry. Musculoskeletal: No kyphosis. Neuropsychiatric: Alert and oriented x3, affect grossly appropriate.  ECG:  At last patient stated he had been EKG June 12, 2020 sinus tachycardia rate of 102.  Possible left atrial enlargement, nonspecific ST and T wave abnormality.  Recent Labwork: No results found for requested labs within last 8760 hours.     Component Value Date/Time   CHOL  12/25/2009 0526    175        ATP III  CLASSIFICATION:  <200     mg/dL   Desirable  200-239  mg/dL   Borderline High  >=240    mg/dL   High          TRIG 177 (H) 12/25/2009 0526   HDL 64 12/25/2009 0526   CHOLHDL 2.7 12/25/2009 0526   VLDL 35 12/25/2009 0526   LDLCALC  12/25/2009 0526    76        Total Cholesterol/HDL:CHD Risk Coronary Heart Disease Risk Table                     Men   Women  1/2 Average Risk   3.4   3.3  Average Risk       5.0   4.4  2 X Average Risk   9.6   7.1  3 X Average Risk  23.4   11.0        Use the calculated Patient Ratio above and the CHD Risk Table to determine the patient's CHD Risk.        ATP III CLASSIFICATION (LDL):  <100     mg/dL   Optimal  100-129  mg/dL   Near or Above                    Optimal  130-159  mg/dL   Borderline  160-189  mg/dL   High  >190     mg/dL   Very High    Other Studies Reviewed Today:  Lexiscan Myoview stress test 06/20/2020  No diagnostic ST segment changes indicate ischemia.  Small, mild intensity, fixed mid to apical anteroseptal defect that is more prominent at rest and consistent with breast attenuation. No definite ischemia.  This is a low risk study.  Nuclear stress EF: 60%.     2017 echo Study Conclusions   - Left ventricle: The cavity size was normal. Wall thickness was  normal. Systolic function was normal. The estimated ejection  fraction was in the range of 60% to 65%. Wall motion was normal;  there were no regional wall motion abnormalities. Left  ventricular diastolic function parameters were normal.  - Mitral valve: Mildly thickened leaflets . There was mild  regurgitation.  - Left atrium: The  atrium was normal in size.  - Tricuspid valve: There was mild regurgitation.  - Pulmonary arteries: PA peak pressure: 30 mm Hg (S).  - Inferior vena cava: The vessel was normal in size. The  respirophasic diameter changes were in the normal range (>= 50%),  consistent with normal central venous  pressure    Assessment and Plan:  1. CAD in native artery   2. Chest pain, unspecified type    1. CAD in native artery History of Cardiac catheterization 2011 showed 40% ostial LCx without other disease  2. Chest pain, unspecified type Recent stress test showed no evidence of ischemia and considered low risk.  Patient states she is still having intermittent chest pain which she describes as a squeezing pain in the left upper chest.  States it can be worse with activity and performing household chores that require more strenuous effort.  States she has a pending appointment with [facility] gastroenterology for cirrhosis and liver lesions.  She has significant GERD which for which she takes Protonix.  Medication Adjustments/Labs and Tests Ordered: Current medicines are reviewed at length with the patient today.  Concerns regarding medicines are outlined above.   Disposition: Follow-up with Dr. Harl Bowie or APP 6 months  Signed, Levell July, NP 07/10/2020 9:01 AM    [facility] [place], [place], [place] [address] Phone: [phone]; Fax: [phone]

## 2020-07-10 ENCOUNTER — Encounter: Payer: Self-pay | Admitting: Family Medicine

## 2020-07-10 ENCOUNTER — Ambulatory Visit (INDEPENDENT_AMBULATORY_CARE_PROVIDER_SITE_OTHER): Payer: Medicare (Managed Care) | Admitting: Family Medicine

## 2020-07-10 VITALS — BP 118/80 | HR 72 | Ht 60.0 in | Wt 127.0 lb

## 2020-07-10 DIAGNOSIS — R079 Chest pain, unspecified: Secondary | ICD-10-CM | POA: Diagnosis not present

## 2020-07-10 DIAGNOSIS — I2510 Atherosclerotic heart disease of native coronary artery without angina pectoris: Secondary | ICD-10-CM | POA: Diagnosis not present

## 2020-07-10 NOTE — Patient Instructions (Addendum)
Medication Instructions:   Your physician recommends that you continue on your current medications as directed. Please refer to the Current Medication list given to you today.  Labwork:  None  Testing/Procedures:  None  Follow-Up:  Your physician recommends that you schedule a follow-up appointment in: 6 months [place].   Any Other Special Instructions Will Be Listed Below (If Applicable).  If you need a refill on your cardiac medications before your next appointment, please call your pharmacy.

## 2021-04-21 ENCOUNTER — Emergency Department (HOSPITAL_COMMUNITY)
Admission: EM | Admit: 2021-04-21 | Discharge: 2021-04-21 | Disposition: A | Discharge status: Home / Self Care | Payer: Medicare Other | Attending: Emergency Medicine | Admitting: Emergency Medicine

## 2021-04-21 ENCOUNTER — Other Ambulatory Visit: Payer: Self-pay

## 2021-04-21 DIAGNOSIS — I10 Essential (primary) hypertension: Secondary | ICD-10-CM | POA: Insufficient documentation

## 2021-04-21 DIAGNOSIS — Z7982 Long term (current) use of aspirin: Secondary | ICD-10-CM | POA: Diagnosis not present

## 2021-04-21 DIAGNOSIS — J449 Chronic obstructive pulmonary disease, unspecified: Secondary | ICD-10-CM | POA: Insufficient documentation

## 2021-04-21 DIAGNOSIS — U071 COVID-19: Secondary | ICD-10-CM | POA: Diagnosis not present

## 2021-04-21 DIAGNOSIS — F17210 Nicotine dependence, cigarettes, uncomplicated: Secondary | ICD-10-CM | POA: Insufficient documentation

## 2021-04-21 DIAGNOSIS — Z8541 Personal history of malignant neoplasm of cervix uteri: Secondary | ICD-10-CM | POA: Diagnosis not present

## 2021-04-21 DIAGNOSIS — R112 Nausea with vomiting, unspecified: Secondary | ICD-10-CM | POA: Diagnosis present

## 2021-04-21 DIAGNOSIS — R197 Diarrhea, unspecified: Secondary | ICD-10-CM

## 2021-04-21 DIAGNOSIS — R110 Nausea: Secondary | ICD-10-CM

## 2021-04-21 LAB — CBC WITH DIFFERENTIAL/PLATELET
Abs Immature Granulocytes: 0.03 10*3/uL (ref 0.00–0.07)
Basophils Absolute: 0 10*3/uL (ref 0.0–0.1)
Basophils Relative: 1 %
Eosinophils Absolute: 0 10*3/uL (ref 0.0–0.5)
Eosinophils Relative: 1 %
HCT: 42.4 % (ref 36.0–46.0)
Hemoglobin: 13.8 g/dL (ref 12.0–15.0)
Immature Granulocytes: 0 %
Lymphocytes Relative: 48 %
Lymphs Abs: 3.2 10*3/uL (ref 0.7–4.0)
MCH: 30.6 pg (ref 26.0–34.0)
MCHC: 32.5 g/dL (ref 30.0–36.0)
MCV: 94 fL (ref 80.0–100.0)
Monocytes Absolute: 0.4 10*3/uL (ref 0.1–1.0)
Monocytes Relative: 5 %
Neutro Abs: 3 10*3/uL (ref 1.7–7.7)
Neutrophils Relative %: 45 %
Platelets: 248 10*3/uL (ref 150–400)
RBC: 4.51 MIL/uL (ref 3.87–5.11)
RDW: 14.1 % (ref 11.5–15.5)
WBC: 6.7 10*3/uL (ref 4.0–10.5)
nRBC: 0 % (ref 0.0–0.2)

## 2021-04-21 LAB — URINALYSIS, ROUTINE W REFLEX MICROSCOPIC
Bacteria, UA: NONE SEEN
Bilirubin Urine: NEGATIVE
Glucose, UA: NEGATIVE mg/dL
Ketones, ur: NEGATIVE mg/dL
Leukocytes,Ua: NEGATIVE
Nitrite: NEGATIVE
Protein, ur: NEGATIVE mg/dL
Specific Gravity, Urine: 1.003 — ABNORMAL LOW (ref 1.005–1.030)
pH: 6 (ref 5.0–8.0)

## 2021-04-21 LAB — COMPREHENSIVE METABOLIC PANEL
ALT: 45 U/L — ABNORMAL HIGH (ref 0–44)
AST: 67 U/L — ABNORMAL HIGH (ref 15–41)
Albumin: 4.6 g/dL (ref 3.5–5.0)
Alkaline Phosphatase: 75 U/L (ref 38–126)
Anion gap: 7 (ref 5–15)
BUN: 7 mg/dL (ref 6–20)
CO2: 25 mmol/L (ref 22–32)
Calcium: 9.4 mg/dL (ref 8.9–10.3)
Chloride: 99 mmol/L (ref 98–111)
Creatinine, Ser: 0.71 mg/dL (ref 0.44–1.00)
GFR, Estimated: >60 mL/min (ref >60)
Glucose, Bld: 96 mg/dL (ref 70–99)
Potassium: 3.9 mmol/L (ref 3.5–5.1)
Sodium: 131 mmol/L — ABNORMAL LOW (ref 135–145)
Total Bilirubin: 0.5 mg/dL (ref 0.3–1.2)
Total Protein: 8.4 g/dL — ABNORMAL HIGH (ref 6.5–8.1)

## 2021-04-21 LAB — LIPASE, BLOOD: Lipase: 23 U/L (ref 11–51)

## 2021-04-21 MED ORDER — SODIUM CHLORIDE 0.9 % IV BOLUS: 1000.0000 mL | Freq: Once | INTRAVENOUS | Status: DC

## 2021-04-21 MED ORDER — ONDANSETRON HCL 4 MG/2ML IJ SOLN: 4.0000 mg | Freq: Once | INTRAMUSCULAR | Status: DC

## 2021-04-21 MED ORDER — ONDANSETRON 4 MG PO TBDP
4.0000 mg | ORAL_TABLET | Freq: Once | ORAL | Status: AC
  Administered 2021-04-21: 4 mg via ORAL
  Filled 2021-04-21: qty 1

## 2021-04-21 NOTE — ED Notes (Signed)
Pt left department without signing out [facility]

## 2021-04-21 NOTE — ED Notes (Signed)
Pt tolerating oral intake of fluids at this time. 

## 2021-04-21 NOTE — ED Triage Notes (Signed)
Pt. States they have been having diarrhea, vomiting, weakness and cramping in their hands.

## 2021-04-22 LAB — SARS CORONAVIRUS 2 (TAT 6-24 HRS): SARS Coronavirus 2: POSITIVE — AB

## 2021-04-22 NOTE — ED Provider Notes (Signed)
[facility] EMERGENCY DEPARTMENT Provider Note   CSN: [id number] Arrival date & time: 04/21/21  1355     History Chief Complaint  Patient presents with   Nausea    Courtney Thornton is a 55 y.o. female with a history of COPD, hyperlipidemia, history of TIA and MS, also rheumatoid arthritis presenting with a 2-day history of nausea emesis nonbloody diarrhea and generalized weakness.  She is also noticed today having some intermittent cramping in her hands and her feet and is concerned about being dehydrated.  She has been able to maintain p.o. fluid intake in small quantities but has been unable to tolerate any solid food intake since her symptoms began.  Of note, her grandchild is also here for evaluation of cough and respiratory type symptoms. They have both been exposed to a family member with [place] within the last 10 days.  She denies shortness of breath, fevers, chest pain or abdominal pain except for some mild cramping prior to diarrheal episodes.  She has had about 4 episodes of diarrhea daily for the past 2 days.  She has had no medications for her symptoms.  The history is provided by the patient.      Past Medical History:  Diagnosis Date   Anxiety and depression    Cervical cancer ([place])    Collagen vascular disease ([place])    COPD (chronic obstructive pulmonary disease) ([place])    Essential hypertension    GERD (gastroesophageal reflux disease)    Headache(784.0)    Hepatic cirrhosis ([facility])    History of alcohol abuse    History of blood transfusion    History of cardiac catheterization    Mild, nonobstructive circumflex disease 2011 - [facility]   History of kidney stones    History of TIA (transient ischemic attack)    Per patient report   Hyperlipidemia    Insomnia    Liver lesion    MRI 2018   Migraines    Multiple sclerosis ([place])     Patient Active Problem List   Diagnosis Date Noted   Hepatic cirrhosis ([place]) 03/23/2017   Liver lesion 03/23/2017   Dysphagia  03/23/2017   Rectal bleeding 03/23/2017   Adult failure to thrive 08/12/2016   Change in bowel movement 08/12/2016   Subacute confusional state 08/12/2016   Leukocytosis 08/06/2016   Hypokalemia 07/31/2016   Hyponatremia 07/31/2016   Alcohol abuse 07/31/2016   Anemia 07/31/2016   Diarrhea 07/31/2016   Protein-calorie malnutrition, severe ([place]) 07/31/2016   HTN (hypertension) 07/31/2016   GERD (gastroesophageal reflux disease) 07/31/2016    Past Surgical History:  Procedure Laterality Date   ABDOMINAL HYSTERECTOMY  2004   partial d/t fibroid tumors   ANTERIOR CERVICAL DECOMP/DISCECTOMY FUSION  11/24/2011   Procedure: ANTERIOR CERVICAL DECOMPRESSION/DISCECTOMY FUSION 3 LEVELS;  Surgeon: Hosie Spangle, MD;  Location: [facility];  Service: Neurosurgery;  Laterality: N/A;  Cervical three-four, four-five,five-six anterior cervical decompression with fusion plating and bonegraft   BREAST SURGERY  2002   breast reduction   CERVICAL CONE BIOPSY  20+yrs ago   d/t cervical cancer   COLONOSCOPY WITH PROPOFOL N/A 04/27/2017   Dr. Gala Romney" congest mucosa likely secondary to cirrhosis. internal hemorrhoids. otherwise normal.    ECTOPIC PREGNANCY SURGERY  1987/1989   ESOPHAGOGASTRODUODENOSCOPY (EGD) WITH PROPOFOL N/A 04/27/2017   Dr. Gala Romney: normal esophagus s/p dilation. small hiatal hernia, portal gastropathy, normal duodenum. No specimens collected. Surveillance in 2 years    HAND SURGERY Right 90's   cyst removed  from top of hand   MALONEY DILATION N/A 04/27/2017   Procedure: Venia Minks DILATION;  Surgeon: Daneil Dolin, MD;  Location: [facility];  Service: Endoscopy;  Laterality: N/A;   TUBAL LIGATION       OB History     Gravida  3   Para  1   Term      Preterm      AB  2   Living  1      SAB      IAB      Ectopic  2   Multiple      Live Births              Family History  Problem Relation Age of Onset   Arthritis Father    Cancer Father    Alcoholism  Mother    Hypertension Mother    Aneurysm Sister    Anemia Daughter    Anesthesia problems Neg Hx    Hypotension Neg Hx    Malignant hyperthermia Neg Hx    Pseudochol deficiency Neg Hx    Colon cancer Neg Hx    Colon polyps Neg Hx     Social History   Tobacco Use   Smoking status: Every Day    Packs/day: 1.00    Years: 32.00    Pack years: 32.00    Types: Cigarettes   Smokeless tobacco: Never  Vaping Use   Vaping Use: Never used  Substance Use Topics   Alcohol use: Not Currently    Alcohol/week: 1.0 standard drink    Types: 1 Cans of beer per week    Comment: History of intermittent alcohol use   Drug use: Yes    Types: Marijuana    Comment: pt states she was told to use this for weight gain     Home Medications Prior to Admission medications   Medication Sig Start Date End Date Taking? Authorizing Provider  acetaminophen (TYLENOL) 500 MG tablet Take 1,000 mg by mouth every 6 (six) hours as needed for moderate pain.    [provider]  albuterol (PROVENTIL HFA;VENTOLIN HFA) 108 (90 Base) MCG/ACT inhaler Inhale 2 puffs into the lungs every 6 (six) hours as needed. For shortness of breath Patient taking differently: Inhale 2 puffs into the lungs every 6 (six) hours as needed for wheezing or shortness of breath. For shortness of breath  09/01/16   Lauree Chandler, NP  aspirin 81 MG chewable tablet Chew 81 mg by mouth daily.     [provider]  buPROPion (ZYBAN) 150 MG 12 hr tablet Take 150 mg by mouth 2 (two) times daily.    [provider]  cyclobenzaprine (FLEXERIL) 10 MG tablet Take 1 tablet (10 mg total) by mouth at bedtime. Patient taking differently: Take 10 mg by mouth daily as needed (sleep).  04/28/17   Sanjuana Kava, MD  diphenhydrAMINE (BENADRYL) 25 mg capsule Take 25 mg by mouth every 6 (six) hours as needed for itching.    [provider]  DULERA 200-5 MCG/ACT AERO Inhale 2 puffs into the lungs 2 (two) times daily. 05/16/20    [provider]  Ferrous Sulfate (IRON PO) Take 1 tablet by mouth daily.    [provider]  gabapentin (NEURONTIN) 300 MG capsule Take 300 mg by mouth 3 (three) times daily.     [provider]  methocarbamol (ROBAXIN) 500 MG tablet Take 1 tablet (500 mg total) by mouth 2 (two) times daily as  needed for muscle spasms. 05/01/19   Jacqlyn Larsen, PA-C  Multiple Vitamin (MULITIVITAMIN WITH MINERALS) TABS Take 1 tablet by mouth daily.    [provider]  naproxen (NAPROSYN) 500 MG tablet Take 500 mg by mouth 2 (two) times daily with a meal.    [provider]  naproxen sodium (ANAPROX) 220 MG tablet Take 440 mg by mouth 2 (two) times daily as needed (pain).    [provider]  nitroGLYCERIN (NITROSTAT) 0.4 MG SL tablet Place 1 tablet (0.4 mg total) under the tongue every 5 (five) minutes as needed. For chest pain 09/01/16   Lauree Chandler, NP  Omega-3 1000 MG CAPS Take 1,000 mg by mouth daily.     [provider]  oxymetazoline (AFRIN) 0.05 % nasal spray Place 1 spray into both nostrils 2 (two) times daily as needed for congestion (for nose bleeds).    [provider]  pantoprazole (PROTONIX) 40 MG tablet Take 40 mg by mouth at bedtime.    [provider]  POTASSIUM PO Take 1 tablet by mouth daily as needed. OTC & Takes sometimes    [provider]  rosuvastatin (CRESTOR) 20 MG tablet Take 20 mg by mouth at bedtime. 06/19/20   [provider]  spironolactone (ALDACTONE) 50 MG tablet Take 50 mg by mouth 2 (two) times daily.  01/31/17   [provider]  traMADol (ULTRAM) 50 MG tablet Take 1 tablet (50 mg total) every 6 (six) hours as needed by mouth. 07/17/17   Lily Kocher, PA-C  traZODone (DESYREL) 100 MG tablet Take 100 mg by mouth at bedtime.    [provider]    Allergies    Codeine  Review of Systems   Review of Systems  Constitutional:  Negative for chills and fever.   HENT:  Negative for congestion and sore throat.   Eyes: Negative.   Respiratory:  Negative for cough, chest tightness and shortness of breath.   Cardiovascular:  Negative for chest pain.  Gastrointestinal:  Positive for diarrhea, nausea and vomiting. Negative for abdominal pain.  Genitourinary: Negative.   Musculoskeletal:  Negative for arthralgias, joint swelling and neck pain.  Skin: Negative.  Negative for rash and wound.  Neurological:  Negative for dizziness, weakness, light-headedness, numbness and headaches.  Psychiatric/Behavioral: Negative.    All other systems reviewed and are negative.  Physical Exam Updated Vital Signs BP 122/81   Pulse 85   Temp 98.3 F (36.8 C) (Oral)   Resp 16   Ht 5' (1.524 m)   Wt 55.3 kg   SpO2 99%   BMI 23.83 kg/m   Physical Exam Vitals and nursing note reviewed.  Constitutional:      Appearance: She is well-developed.  HENT:     Head: Normocephalic and atraumatic.     Mouth/Throat:     Mouth: Mucous membranes are moist.  Eyes:     Conjunctiva/sclera: Conjunctivae normal.  Cardiovascular:     Rate and Rhythm: Normal rate and regular rhythm.     Heart sounds: Normal heart sounds.  Pulmonary:     Effort: Pulmonary effort is normal.     Breath sounds: Normal breath sounds. No wheezing.  Abdominal:     General: Bowel sounds are normal.     Palpations: Abdomen is soft.     Tenderness: There is no abdominal tenderness. There is no guarding.  Musculoskeletal:        General: Normal range of motion.     Cervical  back: Normal range of motion.  Skin:    General: Skin is warm and dry.  Neurological:     General: No focal deficit present.     Mental Status: She is alert.    ED Results / Procedures / Treatments   Labs (all labs ordered are listed, but only abnormal results are displayed) Labs Reviewed  COMPREHENSIVE METABOLIC PANEL - Abnormal; Notable for the following components:      Result Value   Sodium 131 (*)    Total  Protein 8.4 (*)    AST 67 (*)    ALT 45 (*)    All other components within normal limits  URINALYSIS, ROUTINE W REFLEX MICROSCOPIC - Abnormal; Notable for the following components:   Color, Urine STRAW (*)    Specific Gravity, Urine 1.003 (*)    Hgb urine dipstick SMALL (*)    All other components within normal limits  SARS CORONAVIRUS 2 (TAT 6-24 HRS)  CBC WITH DIFFERENTIAL/PLATELET  LIPASE, BLOOD    EKG None  Radiology No results found.  Procedures Procedures   Medications Ordered in ED Medications  ondansetron (ZOFRAN-ODT) disintegrating tablet 4 mg (4 mg Oral Given 04/21/21 1753)    ED Course  I have reviewed the triage vital signs and the nursing notes.  Pertinent labs & imaging results that were available during my care of the patient were reviewed by me and considered in my medical decision making (see chart for details).    MDM Rules/Calculators/A&P                           Patient with symptoms suggesting a viral gastroenteritis.  She has had a positive exposure to [place] recently however.  COVID screening was ordered, however it was not collected by the time patient eloped the department.  She did have blood work drawn and these results however are stable.She does have a mild elevation in her LFTs with an AST of 67 and an ALT of 45.  She did not inform me or the nursing staff of her departure. Final Clinical Impression(s) / ED Diagnoses Final diagnoses:  Nausea without vomiting  Diarrhea of presumed infectious origin    Rx / DC Orders ED Discharge Orders     None        Landis Martins 04/22/21 Nicky Pugh, MD 04/22/21 [phone]

## 2021-07-13 ENCOUNTER — Emergency Department (HOSPITAL_COMMUNITY)
Admission: EM | Admit: 2021-07-13 | Discharge: 2021-07-13 | Disposition: A | Discharge status: Home / Self Care | Payer: Medicare Other | Attending: Emergency Medicine | Admitting: Emergency Medicine

## 2021-07-13 ENCOUNTER — Emergency Department (HOSPITAL_COMMUNITY): Payer: Medicare Other

## 2021-07-13 ENCOUNTER — Encounter (HOSPITAL_COMMUNITY): Payer: Self-pay | Admitting: Emergency Medicine

## 2021-07-13 ENCOUNTER — Other Ambulatory Visit: Payer: Self-pay

## 2021-07-13 DIAGNOSIS — I10 Essential (primary) hypertension: Secondary | ICD-10-CM | POA: Insufficient documentation

## 2021-07-13 DIAGNOSIS — Z7982 Long term (current) use of aspirin: Secondary | ICD-10-CM | POA: Insufficient documentation

## 2021-07-13 DIAGNOSIS — J449 Chronic obstructive pulmonary disease, unspecified: Secondary | ICD-10-CM | POA: Insufficient documentation

## 2021-07-13 DIAGNOSIS — Z7951 Long term (current) use of inhaled steroids: Secondary | ICD-10-CM | POA: Insufficient documentation

## 2021-07-13 DIAGNOSIS — F17210 Nicotine dependence, cigarettes, uncomplicated: Secondary | ICD-10-CM | POA: Diagnosis not present

## 2021-07-13 DIAGNOSIS — M542 Cervicalgia: Secondary | ICD-10-CM | POA: Diagnosis not present

## 2021-07-13 DIAGNOSIS — Z8541 Personal history of malignant neoplasm of cervix uteri: Secondary | ICD-10-CM | POA: Insufficient documentation

## 2021-07-13 DIAGNOSIS — M25511 Pain in right shoulder: Secondary | ICD-10-CM | POA: Insufficient documentation

## 2021-07-13 MED ORDER — METHYLPREDNISOLONE SODIUM SUCC 125 MG IJ SOLR
125.0000 mg | Freq: Once | INTRAMUSCULAR | Status: AC
  Administered 2021-07-13: 125 mg via INTRAMUSCULAR
  Filled 2021-07-13: qty 2

## 2021-07-13 MED ORDER — PREDNISONE 50 MG PO TABS: ORAL_TABLET | ORAL | 0 refills | Status: DC

## 2021-07-13 MED ORDER — KETOROLAC TROMETHAMINE 60 MG/2ML IM SOLN
60.0000 mg | Freq: Once | INTRAMUSCULAR | Status: AC
  Administered 2021-07-13: 60 mg via INTRAMUSCULAR
  Filled 2021-07-13: qty 2

## 2021-07-13 MED ORDER — HYDROCODONE-ACETAMINOPHEN 5-325 MG PO TABS: 1.0000 | ORAL_TABLET | ORAL | 0 refills | Status: AC | PRN

## 2021-07-13 NOTE — ED Triage Notes (Signed)
Pt to the ED with right shoulder neck and lower back pain that began on Saturday.  Pt ambulated to triage with a steady gait.

## 2021-07-13 NOTE — ED Provider Notes (Signed)
[facility] EMERGENCY DEPARTMENT Provider Note   CSN: [id number] Arrival date & time: 07/13/21  0846     History Chief Complaint  Patient presents with   Shoulder Pain    Courtney Thornton is a 55 y.o. female.  The history is provided by the patient. No language interpreter was used.  Shoulder Pain Location:  Shoulder Shoulder location:  R shoulder Injury: no   Pain details:    Quality:  Aching   Radiates to:  Does not radiate   Severity:  Moderate   Onset quality:  Gradual   Duration:  1 week   Progression:  Worsening Dislocation: no   Relieved by:  Nothing Worsened by:  Nothing Associated symptoms: neck pain   Pt reports she has shoulder problems.  Pt has had neck surgery.  Pt reports increased neck pain and shoulder pain      Past Medical History:  Diagnosis Date   Anxiety and depression    Cervical cancer ([place])    Collagen vascular disease ([place])    COPD (chronic obstructive pulmonary disease) ([place])    Essential hypertension    GERD (gastroesophageal reflux disease)    Headache(784.0)    Hepatic cirrhosis ([place])    History of alcohol abuse    History of blood transfusion    History of cardiac catheterization    Mild, nonobstructive circumflex disease 2011 - [facility]   History of kidney stones    History of TIA (transient ischemic attack)    Per patient report   Hyperlipidemia    Insomnia    Liver lesion    MRI 2018   Migraines    Multiple sclerosis ([place])     Patient Active Problem List   Diagnosis Date Noted   Hepatic cirrhosis ([place]) 03/23/2017   Liver lesion 03/23/2017   Dysphagia 03/23/2017   Rectal bleeding 03/23/2017   Adult failure to thrive 08/12/2016   Change in bowel movement 08/12/2016   Subacute confusional state 08/12/2016   Leukocytosis 08/06/2016   Hypokalemia 07/31/2016   Hyponatremia 07/31/2016   Alcohol abuse 07/31/2016   Anemia 07/31/2016   Diarrhea 07/31/2016   Protein-calorie malnutrition, severe ([place]) 07/31/2016   HTN  (hypertension) 07/31/2016   GERD (gastroesophageal reflux disease) 07/31/2016    Past Surgical History:  Procedure Laterality Date   ABDOMINAL HYSTERECTOMY  2004   partial d/t fibroid tumors   ANTERIOR CERVICAL DECOMP/DISCECTOMY FUSION  11/24/2011   Procedure: ANTERIOR CERVICAL DECOMPRESSION/DISCECTOMY FUSION 3 LEVELS;  Surgeon: Hosie Spangle, MD;  Location: [facility];  Service: Neurosurgery;  Laterality: N/A;  Cervical three-four, four-five,five-six anterior cervical decompression with fusion plating and bonegraft   BREAST SURGERY  2002   breast reduction   CERVICAL CONE BIOPSY  20+yrs ago   d/t cervical cancer   COLONOSCOPY WITH PROPOFOL N/A 04/27/2017   Dr. Gala Romney" congest mucosa likely secondary to cirrhosis. internal hemorrhoids. otherwise normal.    ECTOPIC PREGNANCY SURGERY  1987/1989   ESOPHAGOGASTRODUODENOSCOPY (EGD) WITH PROPOFOL N/A 04/27/2017   Dr. Gala Romney: normal esophagus s/p dilation. small hiatal hernia, portal gastropathy, normal duodenum. No specimens collected. Surveillance in 2 years    HAND SURGERY Right 90's   cyst removed from top of hand   MALONEY DILATION N/A 04/27/2017   Procedure: Venia Minks DILATION;  Surgeon: Daneil Dolin, MD;  Location: [facility];  Service: Endoscopy;  Laterality: N/A;   TUBAL LIGATION       OB History     Gravida  3   Para  1   Term      Preterm      AB  2   Living  1      SAB      IAB      Ectopic  2   Multiple      Live Births              Family History  Problem Relation Age of Onset   Arthritis Father    Cancer Father    Alcoholism Mother    Hypertension Mother    Aneurysm Sister    Anemia Daughter    Anesthesia problems Neg Hx    Hypotension Neg Hx    Malignant hyperthermia Neg Hx    Pseudochol deficiency Neg Hx    Colon cancer Neg Hx    Colon polyps Neg Hx     Social History   Tobacco Use   Smoking status: Every Day    Packs/day: 1.00    Years: 32.00    Pack years: 32.00    Types:  Cigarettes   Smokeless tobacco: Never  Vaping Use   Vaping Use: Never used  Substance Use Topics   Alcohol use: Not Currently    Alcohol/week: 1.0 standard drink    Types: 1 Cans of beer per week    Comment: History of intermittent alcohol use   Drug use: Yes    Types: Marijuana    Comment: pt states she was told to use this for weight gain     Home Medications Prior to Admission medications   Medication Sig Start Date End Date Taking? Authorizing Provider  acetaminophen (TYLENOL) 500 MG tablet Take 1,000 mg by mouth every 6 (six) hours as needed for moderate pain.    [provider]  albuterol (PROVENTIL HFA;VENTOLIN HFA) 108 (90 Base) MCG/ACT inhaler Inhale 2 puffs into the lungs every 6 (six) hours as needed. For shortness of breath Patient taking differently: Inhale 2 puffs into the lungs every 6 (six) hours as needed for wheezing or shortness of breath. For shortness of breath  09/01/16   Lauree Chandler, NP  aspirin 81 MG chewable tablet Chew 81 mg by mouth daily.     [provider]  buPROPion (ZYBAN) 150 MG 12 hr tablet Take 150 mg by mouth 2 (two) times daily.    [provider]  cyclobenzaprine (FLEXERIL) 10 MG tablet Take 1 tablet (10 mg total) by mouth at bedtime. Patient taking differently: Take 10 mg by mouth daily as needed (sleep).  04/28/17   Sanjuana Kava, MD  diphenhydrAMINE (BENADRYL) 25 mg capsule Take 25 mg by mouth every 6 (six) hours as needed for itching.    [provider]  DULERA 200-5 MCG/ACT AERO Inhale 2 puffs into the lungs 2 (two) times daily. 05/16/20   [provider]  Ferrous Sulfate (IRON PO) Take 1 tablet by mouth daily.    [provider]  gabapentin (NEURONTIN) 300 MG capsule Take 300 mg by mouth 3 (three) times daily.     [provider]  methocarbamol (ROBAXIN) 500 MG tablet Take 1 tablet (500 mg total) by mouth 2 (two) times daily as needed for muscle spasms. 05/01/19   Jacqlyn Larsen,  PA-C  Multiple Vitamin (MULITIVITAMIN WITH MINERALS) TABS Take 1 tablet by mouth daily.    [provider]  naproxen (NAPROSYN) 500 MG tablet Take 500 mg by mouth 2 (two) times daily with a meal.    [provider]  naproxen sodium (ANAPROX) 220 MG tablet Take 440 mg by mouth 2 (two) times daily as needed (pain).    [provider]  nitroGLYCERIN (NITROSTAT) 0.4 MG SL tablet Place 1 tablet (0.4 mg total) under the tongue every 5 (five) minutes as needed. For chest pain 09/01/16   Lauree Chandler, NP  Omega-3 1000 MG CAPS Take 1,000 mg by mouth daily.     [provider]  oxymetazoline (AFRIN) 0.05 % nasal spray Place 1 spray into both nostrils 2 (two) times daily as needed for congestion (for nose bleeds).    [provider]  pantoprazole (PROTONIX) 40 MG tablet Take 40 mg by mouth at bedtime.    [provider]  POTASSIUM PO Take 1 tablet by mouth daily as needed. OTC & Takes sometimes    [provider]  rosuvastatin (CRESTOR) 20 MG tablet Take 20 mg by mouth at bedtime. 06/19/20   [provider]  spironolactone (ALDACTONE) 50 MG tablet Take 50 mg by mouth 2 (two) times daily.  01/31/17   [provider]  traMADol (ULTRAM) 50 MG tablet Take 1 tablet (50 mg total) every 6 (six) hours as needed by mouth. 07/17/17   Lily Kocher, PA-C  traZODone (DESYREL) 100 MG tablet Take 100 mg by mouth at bedtime.    [provider]    Allergies    Codeine  Review of Systems   Review of Systems  Musculoskeletal:  Positive for neck pain.  All other systems reviewed and are negative.  Physical Exam Updated Vital Signs BP (!) 148/80 (BP Location: Right Arm)   Pulse 84   Temp 98.1 F (36.7 C) (Oral)   Resp 17   Ht 5' (1.524 m)   Wt 53.5 kg   SpO2 100%   BMI 23.05 kg/m   Physical Exam Vitals and nursing note reviewed.  Constitutional:      Appearance: She is well-developed.  HENT:     Head:  Normocephalic.  Cardiovascular:     Rate and Rhythm: Normal rate.  Pulmonary:     Effort: Pulmonary effort is normal.  Abdominal:     General: There is no distension.  Musculoskeletal:        General: Normal range of motion.     Cervical back: Normal range of motion.  Skin:    General: Skin is warm.  Neurological:     General: No focal deficit present.     Mental Status: She is alert and oriented to person, place, and time.  Psychiatric:        Mood and Affect: Mood normal.    ED Results / Procedures / Treatments   Labs (all labs ordered are listed, but only abnormal results are displayed) Labs Reviewed - No data to display  EKG None  Radiology DG Cervical Spine Complete  Result Date: 07/13/2021 CLINICAL DATA:  Pain, right shoulder and neck EXAM: CERVICAL SPINE - COMPLETE 4+ VIEW COMPARISON:  02/18/2012 FINDINGS: The cervical spine is imaged from the skull base and through the superior aspect of T2. Status post ACDF C3-C6, with apparent solid osseous fusion. No perihardware lucency or hardware fracture. No acute osseous abnormality. Multilevel uncovertebral and facet arthropathy, with progression of disc height loss at C2-C3, C6-C7, and C7-T1. IMPRESSION: Status post ACDF C3-C6 with progressive disc height loss and degenerative changes both above and below the fusion. No acute osseous abnormality of the cervical spine. Electronically Signed   By: Merilyn Baba M.D.   On: 07/13/2021  11:13    Procedures Procedures   Medications Ordered in ED Medications  methylPREDNISolone sodium succinate (SOLU-MEDROL) 125 mg/2 mL injection 125 mg (125 mg Intramuscular Given 07/13/21 1117)  ketorolac (TORADOL) injection 60 mg (60 mg Intramuscular Given 07/13/21 1115)    ED Course  I have reviewed the triage vital signs and the nursing notes.  Pertinent labs & imaging results that were available during my care of the patient were reviewed by me and considered in my medical decision making  (see chart for details).    MDM Rules/Calculators/A&P                           MDM:  C spine ordered reviewed interpreted and discussed with pt.  Pt given toradol and solumedrol IM.  Pt given rx for prednisone and hydrocodone 10 tablets. Pt has seen Dr. Aline Brochure for her shoulder in the past.  Pt advised to schedule to see him.  Final Clinical Impression(s) / ED Diagnoses Final diagnoses:  Acute pain of right shoulder  Neck pain    Rx / DC Orders ED Discharge Orders     None     An After Visit Summary was printed and given to the patient.    Fransico Meadow, [place] 07/13/21 1141    Margette Fast, MD 07/19/21 1413

## 2021-07-20 ENCOUNTER — Encounter: Payer: Self-pay | Admitting: Orthopedic Surgery

## 2021-07-20 ENCOUNTER — Ambulatory Visit: Payer: Medicare Other | Admitting: Orthopedic Surgery

## 2021-07-20 ENCOUNTER — Other Ambulatory Visit: Payer: Self-pay

## 2021-07-20 VITALS — BP 144/81 | HR 91 | Ht 60.0 in | Wt 119.8 lb

## 2021-07-20 DIAGNOSIS — Z716 Tobacco abuse counseling: Secondary | ICD-10-CM | POA: Diagnosis not present

## 2021-07-20 DIAGNOSIS — M75121 Complete rotator cuff tear or rupture of right shoulder, not specified as traumatic: Secondary | ICD-10-CM | POA: Diagnosis not present

## 2021-07-20 DIAGNOSIS — G8929 Other chronic pain: Secondary | ICD-10-CM | POA: Diagnosis not present

## 2021-07-20 NOTE — Progress Notes (Signed)
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  Musculoskeletal:  Positive for joint pain and neck pain. Negative for myalgias.  Neurological:  Negative for tingling.   Past Medical History:  Diagnosis Date   Anxiety and depression    Cervical cancer ([place])    Collagen vascular disease ([place])    COPD (chronic obstructive pulmonary disease) ([place])    Essential hypertension    GERD (gastroesophageal reflux disease)    Headache(784.0)    Hepatic cirrhosis ([facility])    History of alcohol abuse    History of blood transfusion    History of cardiac catheterization    Mild, nonobstructive circumflex disease 2011 - [facility]   History of kidney stones    History of TIA (transient ischemic attack)    Per patient report   Hyperlipidemia    Insomnia    Liver lesion    MRI 2018   Migraines    Multiple sclerosis ([place])      Social History   Tobacco Use   Smoking status: Every Day    Packs/day: 1.00    Years: 32.00    Pack years: 32.00    Types: Cigarettes   Smokeless tobacco: Never  Vaping Use   Vaping Use: Never used  Substance Use Topics   Alcohol use: Not Currently    Alcohol/week: 1.0 standard drink    Types: 1 Cans of beer per week    Comment: History of intermittent alcohol use   Drug use: Yes    Types: Marijuana    Comment: pt states she was told to use this for weight  gain    Past Surgical History:  Procedure Laterality Date   ABDOMINAL HYSTERECTOMY  2004   partial d/t fibroid tumors   ANTERIOR CERVICAL DECOMP/DISCECTOMY FUSION  11/24/2011   Procedure: ANTERIOR CERVICAL DECOMPRESSION/DISCECTOMY FUSION 3 LEVELS;  Surgeon: Hosie Spangle, MD;  Location: [facility];  Service: Neurosurgery;  Laterality: N/A;  Cervical three-four, four-five,five-six anterior cervical decompression with fusion plating and bonegraft   BREAST SURGERY  2002   breast reduction   CERVICAL CONE BIOPSY  20+yrs ago   d/t cervical cancer   COLONOSCOPY WITH PROPOFOL N/A 04/27/2017   Dr. Gala Romney" congest mucosa likely secondary to cirrhosis. internal hemorrhoids. otherwise normal.    ECTOPIC PREGNANCY SURGERY  1987/1989   ESOPHAGOGASTRODUODENOSCOPY (EGD) WITH PROPOFOL N/A 04/27/2017   Dr. Gala Romney: normal esophagus s/p dilation. small hiatal hernia, portal gastropathy, normal duodenum. No specimens collected. Surveillance in 2 years    HAND SURGERY Right 90's   cyst removed from top of hand   MALONEY DILATION N/A 04/27/2017   Procedure: Venia Minks DILATION;  Surgeon: Daneil Dolin, MD;  Location[facility];  Service: Endoscopy;  Laterality: N/A;   TUBAL LIGATION      BP (!) 144/81   Pulse 91  Ht 5' (1.524 m)   Wt 119 lb 12.8 oz (54.3 kg)   BMI 23.40 kg/m   Physical Exam Constitutional:      General: She is not in acute distress.    Appearance: She is well-developed.     Comments: Well developed, well nourished Normal grooming and hygiene     Cardiovascular:     Comments: No peripheral edema Musculoskeletal:     Comments: Right shoulder exam  Skin is normal.  No tenderness.  Strength is 5 out of 5 in abduction and flexion with slight pain  Full forward elevation is noted.  Mild pain at terminal flexion  No instability.    Skin:    General: Skin is warm and dry.  Neurological:     Mental Status: She is alert and oriented to person, place, and time.     Sensory:  No sensory deficit.     Coordination: Coordination normal.     Gait: Gait normal.     Deep Tendon Reflexes: Reflexes are normal and symmetric.  Psychiatric:        Mood and Affect: Mood normal.        Behavior: Behavior normal.        Thought Content: Thought content normal.        Judgment: Judgment normal.     Comments: Affect normal    Imaging C-spine done at the hospital interpret this as: Shows a fusion from C3-C6 with some inferior and superior disc space degenerative changes  Previous MRI back in 2018 showed complete rotator cuff tear FINDINGS: The cervical spine is imaged from the skull base and through the superior aspect of T2. Status post ACDF C3-C6, with apparent solid osseous fusion. No perihardware lucency or hardware fracture. No acute osseous abnormality. Multilevel uncovertebral and facet arthropathy, with progression of disc height loss at C2-C3, C6-C7, and C7-T1.   IMPRESSION: Status post ACDF C3-C6 with progressive disc height loss and degenerative changes both above and below the fusion. No acute osseous abnormality of the cervical spine.     Electronically Signed   By: Merilyn Baba M.D.   On: 07/13/2021 11:13    IMPRESSION: 1. Large full-thickness tears of the infraspinatus and supraspinous tendons as described. 2. Slight arthritic changes of the glenohumeral joint. 3. Intrasubstance tear of the long head of the biceps tendon at the level of the bicipital groove. 4. Glenohumeral joint effusion with debris in the joint.     Electronically Signed   By: Lorriane Shire M.D.   On: 04/25/2017 14:33   As the patient can still full forward elevate the arm and there is no pain in his any significance or functional loss I recommended she not have any surgery just continue on medication and exercises as tolerated.

## 2021-07-20 NOTE — Patient Instructions (Signed)
I dont rec surgery right now

## 2021-11-13 ENCOUNTER — Other Ambulatory Visit (HOSPITAL_COMMUNITY): Payer: Self-pay | Admitting: Emergency Medicine

## 2021-11-13 DIAGNOSIS — Z1231 Encounter for screening mammogram for malignant neoplasm of breast: Secondary | ICD-10-CM

## 2022-01-04 ENCOUNTER — Ambulatory Visit (HOSPITAL_COMMUNITY)
Admission: RE | Admit: 2022-01-04 | Discharge: 2022-01-04 | Disposition: A | Discharge status: Home / Self Care | Payer: Medicare Other | Source: Ambulatory Visit | Attending: Emergency Medicine | Admitting: Emergency Medicine

## 2022-01-04 DIAGNOSIS — Z1231 Encounter for screening mammogram for malignant neoplasm of breast: Secondary | ICD-10-CM | POA: Insufficient documentation

## 2022-01-21 ENCOUNTER — Other Ambulatory Visit: Payer: Medicare Other | Admitting: Obstetrics & Gynecology

## 2022-05-18 ENCOUNTER — Emergency Department (HOSPITAL_COMMUNITY)
Admission: EM | Admit: 2022-05-18 | Discharge: 2022-05-18 | Disposition: A | Discharge status: Home / Self Care | Payer: Medicare Other | Attending: Emergency Medicine | Admitting: Emergency Medicine

## 2022-05-18 ENCOUNTER — Emergency Department (HOSPITAL_COMMUNITY): Payer: Medicare Other

## 2022-05-18 ENCOUNTER — Other Ambulatory Visit: Payer: Self-pay

## 2022-05-18 ENCOUNTER — Encounter (HOSPITAL_COMMUNITY): Payer: Self-pay

## 2022-05-18 DIAGNOSIS — I10 Essential (primary) hypertension: Secondary | ICD-10-CM | POA: Insufficient documentation

## 2022-05-18 DIAGNOSIS — R079 Chest pain, unspecified: Secondary | ICD-10-CM | POA: Diagnosis not present

## 2022-05-18 DIAGNOSIS — F17200 Nicotine dependence, unspecified, uncomplicated: Secondary | ICD-10-CM | POA: Diagnosis not present

## 2022-05-18 DIAGNOSIS — E119 Type 2 diabetes mellitus without complications: Secondary | ICD-10-CM | POA: Diagnosis not present

## 2022-05-18 LAB — BASIC METABOLIC PANEL
Anion gap: 9 (ref 5–15)
BUN: 15 mg/dL (ref 6–20)
CO2: 23 mmol/L (ref 22–32)
Calcium: 9 mg/dL (ref 8.9–10.3)
Chloride: 107 mmol/L (ref 98–111)
Creatinine, Ser: 0.78 mg/dL (ref 0.44–1.00)
GFR, Estimated: >60 mL/min (ref >60)
Glucose, Bld: 80 mg/dL (ref 70–99)
Potassium: 3.3 mmol/L — ABNORMAL LOW (ref 3.5–5.1)
Sodium: 139 mmol/L (ref 135–145)

## 2022-05-18 LAB — CBC
HCT: 35.5 % — ABNORMAL LOW (ref 36.0–46.0)
Hemoglobin: 11.6 g/dL — ABNORMAL LOW (ref 12.0–15.0)
MCH: 29.4 pg (ref 26.0–34.0)
MCHC: 32.7 g/dL (ref 30.0–36.0)
MCV: 89.9 fL (ref 80.0–100.0)
Platelets: 295 10*3/uL (ref 150–400)
RBC: 3.95 MIL/uL (ref 3.87–5.11)
RDW: 15.2 % (ref 11.5–15.5)
WBC: 8.9 10*3/uL (ref 4.0–10.5)
nRBC: 0 % (ref 0.0–0.2)

## 2022-05-18 LAB — TROPONIN I (HIGH SENSITIVITY)
Troponin I (High Sensitivity): <2 ng/L (ref <18)
Troponin I (High Sensitivity): 3 ng/L (ref <18)

## 2022-05-18 MED ORDER — POTASSIUM CHLORIDE CRYS ER 20 MEQ PO TBCR
40.0000 meq | EXTENDED_RELEASE_TABLET | Freq: Once | ORAL | Status: AC
  Administered 2022-05-18: 40 meq via ORAL
  Filled 2022-05-18: qty 2

## 2022-05-18 NOTE — ED Triage Notes (Signed)
Patient brought in via EMS from home. Patient c/o left sided chest pain starting today. Patient states she was sitting at home eating when this happened/ admits to drinking a beer and smoking marijuana today. Patient took 1 personal SL nitro and EMS gave 324 ASA to get pain to a 2/10. Vitals stable

## 2022-05-18 NOTE — ED Notes (Signed)
Urine sent to lab if needed 

## 2022-05-18 NOTE — ED Provider Notes (Signed)
Courtney Thornton   CSN: [id number] Arrival date & time: 05/18/22  1539     History Chief Complaint  Patient presents with   Chest Pain    HPI Courtney Thornton is a 56 y.o. female presenting for chest pain.  She is a 56 year old female with a history of multiple comorbid medical problems including hypertension, history of alcohol use disorder, history of cardiac catheterization in 2015 without evidence of a blockage. She denies fevers or chills nausea vomiting syncope or shortness of breath at this time.  She is otherwise ambulatory tolerating p.o. intake.  No known sick contacts.  She states that her pain was substernal in nature approximately 3 hours prior to arrival.  It is now completely resolved.   Patient's recorded medical, surgical, social, medication list and allergies were reviewed in the Snapshot window as part of the initial history.   Review of Systems   Review of Systems  Constitutional:  Negative for chills and fever.  HENT:  Negative for ear pain and sore throat.   Eyes:  Negative for pain and visual disturbance.  Respiratory:  Positive for chest tightness and shortness of breath. Negative for cough.   Cardiovascular:  Negative for chest pain and palpitations.  Gastrointestinal:  Negative for abdominal pain and vomiting.  Genitourinary:  Negative for dysuria and hematuria.  Musculoskeletal:  Negative for arthralgias and back pain.  Skin:  Negative for color change and rash.  Neurological:  Negative for seizures and syncope.  All other systems reviewed and are negative.   Physical Exam Updated Vital Signs BP 121/72   Pulse 83   Temp 98.2 F (36.8 C) (Oral)   Resp 12   Ht 5' (1.524 m)   Wt 53.5 kg   SpO2 100%   BMI 23.05 kg/m  Physical Exam Vitals and nursing Thornton reviewed.  Constitutional:      General: She is not in acute distress.    Appearance: She is well-developed.  HENT:     Head: Normocephalic and atraumatic.   Eyes:     Conjunctiva/sclera: Conjunctivae normal.  Cardiovascular:     Rate and Rhythm: Normal rate and regular rhythm.     Heart sounds: No murmur heard. Pulmonary:     Effort: Pulmonary effort is normal. No respiratory distress.     Breath sounds: Normal breath sounds.  Abdominal:     Palpations: Abdomen is soft.     Tenderness: There is no abdominal tenderness.  Musculoskeletal:        General: No swelling.     Cervical back: Neck supple.  Skin:    General: Skin is warm and dry.     Capillary Refill: Capillary refill takes less than 2 seconds.  Neurological:     Mental Status: She is alert.  Psychiatric:        Mood and Affect: Mood normal.      ED Course/ Medical Decision Making/ A&P Clinical Course as of 05/18/22 1923  Tue May 18, 2022  1822 Chest pain delta trop  [CC]    Clinical Course User Index [CC] Tretha Sciara, MD    Procedures Procedures   Medications Ordered in ED Medications  potassium chloride SA (KLOR-CON M) CR tablet 40 mEq (40 mEq Oral Given 05/18/22 1850)   Medical Decision Making:  Courtney Thornton is a 56 y.o. female who presented to the ED today with chest pain, detailed above.  Based on patient's comorbidities, patient has a heart score of 4.  Patient's presentation is complicated by their history of smoking, hypertension, diabetes, advanced age.  Patient placed on continuous vitals and telemetry monitoring while in ED which was reviewed periodically.   Complete initial physical exam performed, notably the patient was hemodynamically stable in no acute distress.    Reviewed and confirmed nursing documentation for past medical history, family history, social history.       Initial Assessment: With the patient's presentation of left-sided chest pain, most likely diagnosis is musculoskeletal chest pain versus GERD, although ACS remains on the differential. Other diagnoses were considered including (but not limited to) pulmonary  embolism, community-acquired pneumonia, aortic dissection, pneumothorax, underlying bony abnormality, anemia. These are considered less likely due to history of present illness and physical exam findings.     In particular, concerning pulmonary embolism: Patient denies malignancy, recent surgery, history of DVT, or calf tenderness leading to a low risk Wells score.  Aortic Dissection also reconsidered but seems less likely based on the location, quality, onset, and severity of symptoms in this case.  Patient also has a lack of underlying history of AD or TAA. Additionally, physical exam is without a difference of more than 27mHg in blood pressure between the arms.   This is most consistent with an acute life/limb threatening illness complicated by underlying chronic conditions.   Initial Plan:  Evaluate for ACS with delta troponin and EKG evaluated as below  Evaluate for dissection, bony abnormality, or pneumonia with chest x-ray and screening laboratory evaluation including CBC, BMP  Further evaluation for pulmonary embolism not indicated at this time based on patient's  Wells score.  Further evaluation for Thoracic Aortic Dissection not indicated at this time based on patient's clinical history and PE findings.          Initial Study Results:  EKG was reviewed independently. Rate, rhythm, axis, intervals all examined and without medically relevant abnormality. ST segments without concerns for elevations.     Laboratory   Delta troponin demonstrated no acute abnormalities  CBC and BMP without obvious metabolic or inflammatory abnormalities requiring further evaluation      Radiology  DG Chest 2 View  Result Date: 05/18/2022 CLINICAL DATA:  Chest pain EXAM: CHEST - 2 VIEW COMPARISON:  06/23/2017 FINDINGS: The heart size and mediastinal contours are within normal limits. Aortic atherosclerosis. Both lungs are clear. The visualized skeletal structures are unremarkable. IMPRESSION: No  active cardiopulmonary disease. Electronically Signed   By: KDonavan FoilM.D.   On: 05/18/2022 17:16       Final Assessment and Plan:   On repeat assessment after 3-1/2 hours in the emergency department.  Patient has remained asymptomatic throughout this entire duration.  She is no longer having any chest pain or shortness of breath.  Informed patient of her risk for severe cardiac disease and importance of close follow-up with cardiology in the outpatient setting given her elevated HEART score 4.  As her patient's have resolved and she is at moderate risk, she is stable for the outpatient rapid follow-up plan and patient felt comfortable with this plan as she is asymptomatic. Shared medical decision making regarding hospital observation for her chest pain and continued troponin monitoring but patient requested discharge given resolution of her symptoms.  Patient stable for outpatient care and management at this time patient discharged with no further acute events.    Clinical Impression:  1. Chest pain, unspecified type      Discharge   Final Clinical Impression(s) / ED Diagnoses Final diagnoses:  Chest pain, unspecified type    Rx / DC Orders ED Discharge Orders          Ordered    Ambulatory referral to Cardiology        05/18/22 Antonietta Barcelona, MD 05/18/22 [phone]

## 2022-06-17 ENCOUNTER — Ambulatory Visit: Payer: Medicare Other | Attending: Cardiovascular Disease | Admitting: Internal Medicine

## 2022-06-17 ENCOUNTER — Encounter: Payer: Self-pay | Admitting: Internal Medicine

## 2022-06-17 VITALS — BP 122/74 | HR 75 | Ht 59.0 in | Wt 117.4 lb

## 2022-06-17 DIAGNOSIS — I2510 Atherosclerotic heart disease of native coronary artery without angina pectoris: Secondary | ICD-10-CM | POA: Diagnosis not present

## 2022-06-17 NOTE — Patient Instructions (Signed)
Medication Instructions:  Your physician recommends that you continue on your current medications as directed. Please refer to the Current Medication list given to you today.  *If you need a refill on your cardiac medications before your next appointment, please call your pharmacy*   Testing/Procedures: Dr. Harl Bowie has ordered a Myocardial Perfusion Imaging Study.   The test will take approximately 3 to 4 hours to complete; you may bring reading material.  If someone comes with you to your appointment, they will need to remain in the main lobby due to limited space in the testing area.   You will need to hold the following medications prior to your stress test: beta-blockers (24 hours prior to test)   How to prepare for your Myocardial Perfusion Test: Do not eat or drink 3 hours prior to your test, except you may have water. Do not consume products containing caffeine (regular or decaffeinated) 12 hours prior to your test. (ex: coffee, chocolate, sodas, tea). Do wear comfortable clothes (no dresses or overalls) and walking shoes, tennis shoes preferred (No heels or open toe shoes are allowed). Do NOT wear cologne, perfume, aftershave, or lotions (deodorant is allowed). If these instructions are not followed, your test will have to be rescheduled.    Follow-Up: At [facility], you and your health needs are our priority.  As part of our continuing mission to provide you with exceptional heart care, we have created designated Provider Care Teams.  These Care Teams include your primary Cardiologist (physician) and Advanced Practice Providers (APPs -  Physician Assistants and Nurse Practitioners) who all work together to provide you with the care you need, when you need it.  We recommend signing up for the patient portal called "MyChart".  Sign up information is provided on this After Visit Summary.  MyChart is used to connect with patients for Virtual Visits (Telemedicine).  Patients are  able to view lab/test results, encounter notes, upcoming appointments, etc.  Non-urgent messages can be sent to your provider as well.   To learn more about what you can do with MyChart, go to [email or web address].    Your next appointment:   6 month(s)  The format for your next appointment:   In Person  Provider:   Phineas Inches, MD

## 2022-06-17 NOTE — Progress Notes (Signed)
Cardiology Office Note:    Date:  06/17/2022   ID:  Courtney Thornton, DOB 01-09-1966, MRN [id number]  PCP:  Vidal Schwalbe, [place] [facility] Cardiologist:  Carlyle Dolly, MD     Referring MD: Tretha Sciara, MD   No chief complaint on file. Chest pain  History of Present Illness:    Courtney Thornton is a 56 y.o. female with a hx of anxiety, depression, COPD, etoh abuse, cirrhosis, hx of non cardiac CP, went to the ED for chest pain. EKG was normal. Troponin is negative. She notes she gets sharp pain from her chest to her back. It was worse on that day. It's the same discomfort from 2021. She notes the sharp pain comes and goes. Can happen while sitting, she has no activity limiting CP/SOB. Her physical activity is activities of daily living.  Seen in 2021 by Dr. Zandra Abts and Courtney July NP. She reported CP at that time and DOE. She had symptoms of significant GERD. She had a lexiscan that was normal. She had a normal ehco in 2017 with mild MR. PA pressure 30 mmHg. She did have a LHC in 2011 that showed mild 40% ostial Lcx lesion.   She is a smoker since the age 57. He denies syncope or palpitations. He denies PND/orthopnea/LE edema. Notes some feet numbness,No claudication.   Past Medical History:  Diagnosis Date   Anxiety and depression    Cervical cancer ([place])    Collagen vascular disease ([place])    COPD (chronic obstructive pulmonary disease) ([place])    Essential hypertension    GERD (gastroesophageal reflux disease)    Headache(784.0)    Hepatic cirrhosis ([facility])    History of alcohol abuse    History of blood transfusion    History of cardiac catheterization    Mild, nonobstructive circumflex disease 2011 - [facility]   History of kidney stones    History of TIA (transient ischemic attack)    Per patient report   Hyperlipidemia    Insomnia    Liver lesion    MRI 2018   Migraines    Multiple sclerosis ([place])     Past Surgical History:   Procedure Laterality Date   ABDOMINAL HYSTERECTOMY  2004   partial d/t fibroid tumors   ANTERIOR CERVICAL DECOMP/DISCECTOMY FUSION  11/24/2011   Procedure: ANTERIOR CERVICAL DECOMPRESSION/DISCECTOMY FUSION 3 LEVELS;  Surgeon: Hosie Spangle, MD;  Location: [facility];  Service: Neurosurgery;  Laterality: N/A;  Cervical three-four, four-five,five-six anterior cervical decompression with fusion plating and bonegraft   BREAST SURGERY  2002   breast reduction   CERVICAL CONE BIOPSY  20+yrs ago   d/t cervical cancer   COLONOSCOPY WITH PROPOFOL N/A 04/27/2017   Dr. Gala Romney" congest mucosa likely secondary to cirrhosis. internal hemorrhoids. otherwise normal.    ECTOPIC PREGNANCY SURGERY  1987/1989   ESOPHAGOGASTRODUODENOSCOPY (EGD) WITH PROPOFOL N/A 04/27/2017   Dr. Gala Romney: normal esophagus s/p dilation. small hiatal hernia, portal gastropathy, normal duodenum. No specimens collected. Surveillance in 2 years    HAND SURGERY Right 90's   cyst removed from top of hand   MALONEY DILATION N/A 04/27/2017   Procedure: Venia Minks DILATION;  Surgeon: Daneil Dolin, MD;  Location: [facility];  Service: Endoscopy;  Laterality: N/A;   TUBAL LIGATION      Current Medications: Current Meds  Medication Sig   acetaminophen (TYLENOL) 500 MG tablet Take 1,000 mg by mouth every 6 (six) hours as needed for moderate  pain.   albuterol (PROVENTIL HFA;VENTOLIN HFA) 108 (90 Base) MCG/ACT inhaler Inhale 2 puffs into the lungs every 6 (six) hours as needed. For shortness of breath (Patient taking differently: Inhale 2 puffs into the lungs every 6 (six) hours as needed for wheezing or shortness of breath. For shortness of breath)   aspirin 81 MG chewable tablet Chew 81 mg by mouth daily.    Azelastine HCl 137 MCG/SPRAY SOLN Place 1 spray into both nostrils in the morning.   citalopram (CELEXA) 20 MG tablet Take 20 mg by mouth daily.   diphenhydrAMINE (BENADRYL) 25 mg capsule Take 25 mg by mouth every 6 (six) hours as  needed for itching.   gabapentin (NEURONTIN) 300 MG capsule Take 300 mg by mouth 3 (three) times daily.    HYDROcodone-acetaminophen (NORCO/VICODIN) 5-325 MG tablet Take 1 tablet by mouth every 4 (four) hours as needed for moderate pain.   ibuprofen (ADVIL) 200 MG tablet Take 600 mg by mouth every 6 (six) hours as needed for moderate pain.   naproxen sodium (ANAPROX) 220 MG tablet Take 440 mg by mouth 2 (two) times daily as needed (pain).   nitroGLYCERIN (NITROSTAT) 0.4 MG SL tablet Place 1 tablet (0.4 mg total) under the tongue every 5 (five) minutes as needed. For chest pain   Omega-3 1000 MG CAPS Take 1,000 mg by mouth daily.    oxymetazoline (AFRIN) 0.05 % nasal spray Place 1 spray into both nostrils 2 (two) times daily as needed for congestion (for nose bleeds).   pantoprazole (PROTONIX) 40 MG tablet Take 40 mg by mouth at bedtime.   rosuvastatin (CRESTOR) 20 MG tablet Take 20 mg by mouth at bedtime.   spironolactone (ALDACTONE) 50 MG tablet Take 50 mg by mouth 2 (two) times daily.    traMADol (ULTRAM) 50 MG tablet Take 1 tablet (50 mg total) every 6 (six) hours as needed by mouth.   traZODone (DESYREL) 100 MG tablet Take 100 mg by mouth at bedtime.     Allergies:   Codeine   Social History   Socioeconomic History   Marital status: Married    Spouse name: Not on file   Number of children: Not on file   Years of education: Not on file   Highest education level: Not on file  Occupational History   Occupation: disability  Tobacco Use   Smoking status: Every Day    Packs/day: 1.00    Years: 32.00    Total pack years: 32.00    Types: Cigarettes   Smokeless tobacco: Never  Vaping Use   Vaping Use: Never used  Substance and Sexual Activity   Alcohol use: Not Currently    Alcohol/week: 1.0 standard drink of alcohol    Types: 1 Cans of beer per week    Comment: History of intermittent alcohol use   Drug use: Yes    Types: Marijuana    Comment: pt states she was told to use this  for weight gain    Sexual activity: Yes    Birth control/protection: Surgical  Other Topics Concern   Not on file  Social History Narrative   Not on file   Social Determinants of Health   Financial Resource Strain: Not on file  Food Insecurity: Not on file  Transportation Needs: Not on file  Physical Activity: Not on file  Stress: Not on file  Social Connections: Not on file     Family History: The patient's family history includes Alcoholism in her mother; Anemia in  her daughter; Aneurysm in her sister; Arthritis in her father; Cancer in her father; Hypertension in her mother. There is no history of Anesthesia problems, Hypotension, Malignant hyperthermia, Pseudochol deficiency, Colon cancer, or Colon polyps.  ROS:   Please see the history of present illness.     All other systems reviewed and are negative.  EKGs/Labs/Other Studies Reviewed:    The following studies were reviewed today:   EKG:  EKG is  ordered today.  The ekg ordered today demonstrates   06/17/2022- NSR  Recent Labs: 05/18/2022: BUN 15; Creatinine, Ser 0.78; Hemoglobin 11.6; Platelets 295; Potassium 3.3; Sodium 139   Recent Lipid Panel    Component Value Date/Time   CHOL  12/25/2009 0526    175        ATP III CLASSIFICATION:  <200     mg/dL   Desirable  200-239  mg/dL   Borderline High  >=240    mg/dL   High          TRIG 177 (H) 12/25/2009 0526   HDL 64 12/25/2009 0526   CHOLHDL 2.7 12/25/2009 0526   VLDL 35 12/25/2009 0526   LDLCALC  12/25/2009 0526    76        Total Cholesterol/HDL:CHD Risk Coronary Heart Disease Risk Table                     Men   Women  1/2 Average Risk   3.4   3.3  Average Risk       5.0   4.4  2 X Average Risk   9.6   7.1  3 X Average Risk  23.4   11.0        Use the calculated Patient Ratio above and the CHD Risk Table to determine the patient's CHD Risk.        ATP III CLASSIFICATION (LDL):  <100     mg/dL   Optimal  100-129  mg/dL   Near or Above                     Optimal  130-159  mg/dL   Borderline  160-189  mg/dL   High  >190     mg/dL   Very High     Risk Assessment/Calculations:     Physical Exam:    VS:    Vitals:   06/17/22 1108  BP: 122/74  Pulse: 75  SpO2: 97%     Wt Readings from Last 3 Encounters:  06/17/22 117 lb 6.4 oz (53.3 kg)  05/18/22 118 lb (53.5 kg)  07/20/21 119 lb 12.8 oz (54.3 kg)     GEN:  Well nourished, well developed in no acute distress HEENT: Normal NECK: No JVD; No carotid bruits LYMPHATICS: No lymphadenopathy CARDIAC: RRR, no murmurs, rubs, gallops RESPIRATORY:  Clear to auscultation without rales, wheezing or rhonchi  ABDOMEN: Soft, non-tender, non-distended MUSCULOSKELETAL:  No edema; No deformity  SKIN: Warm and dry NEUROLOGIC:  Alert and oriented x 3 PSYCHIATRIC:  Normal affect   ASSESSMENT:    Chest pain: atypical features however she was noted to have Lcx disease in 2011 and continues to smoke.  She has chronic CP and likely predominantly MSK. Will plan for exercise SPECT.   Tobacco Cessation: discussed the importance of smoking cessation.   She was seen in [place] < 3 years and likely meant to return to this office  PLAN:    In order of problems listed above:  Exercise SPECT        Shared Decision Making/Informed Consent The risks [chest pain, shortness of breath, cardiac arrhythmias, dizziness, blood pressure fluctuations, myocardial infarction, stroke/transient ischemic attack, and life-threatening complications (estimated to be 1 in 10,000)], benefits (risk stratification, diagnosing coronary artery disease, treatment guidance) and alternatives of an exercise tolerance test were discussed in detail with Ms. Carrie Mew and she agrees to proceed.    Medication Adjustments/Labs and Tests Ordered: Current medicines are reviewed at length with the patient today.  Concerns regarding medicines are outlined above.  Orders Placed This Encounter  Procedures   MYOCARDIAL  PERFUSION IMAGING   EKG 12-Lead   No orders of the defined types were placed in this encounter.   Patient Instructions  Medication Instructions:  Your physician recommends that you continue on your current medications as directed. Please refer to the Current Medication list given to you today.  *If you need a refill on your cardiac medications before your next appointment, please call your pharmacy*   Testing/Procedures: Dr. Harl Bowie has ordered a Myocardial Perfusion Imaging Study.   The test will take approximately 3 to 4 hours to complete; you may bring reading material.  If someone comes with you to your appointment, they will need to remain in the main lobby due to limited space in the testing area.   You will need to hold the following medications prior to your stress test: beta-blockers (24 hours prior to test)   How to prepare for your Myocardial Perfusion Test: Do not eat or drink 3 hours prior to your test, except you may have water. Do not consume products containing caffeine (regular or decaffeinated) 12 hours prior to your test. (ex: coffee, chocolate, sodas, tea). Do wear comfortable clothes (no dresses or overalls) and walking shoes, tennis shoes preferred (No heels or open toe shoes are allowed). Do NOT wear cologne, perfume, aftershave, or lotions (deodorant is allowed). If these instructions are not followed, your test will have to be rescheduled.    Follow-Up: At [facility], you and your health needs are our priority.  As part of our continuing mission to provide you with exceptional heart care, we have created designated Provider Care Teams.  These Care Teams include your primary Cardiologist (physician) and Advanced Practice Providers (APPs -  Physician Assistants and Nurse Practitioners) who all work together to provide you with the care you need, when you need it.  We recommend signing up for the patient portal called "MyChart".  Sign up information is  provided on this After Visit Summary.  MyChart is used to connect with patients for Virtual Visits (Telemedicine).  Patients are able to view lab/test results, encounter notes, upcoming appointments, etc.  Non-urgent messages can be sent to your provider as well.   To learn more about what you can do with MyChart, go to [email or web address].    Your next appointment:   6 month(s)  The format for your next appointment:   In Person  Provider:   Phineas Inches, MD   Signed, Janina Mayo, MD  06/17/2022 11:58 AM    [place]

## 2022-06-28 ENCOUNTER — Encounter (HOSPITAL_COMMUNITY): Payer: Medicare HMO

## 2022-06-28 ENCOUNTER — Ambulatory Visit (HOSPITAL_COMMUNITY): Payer: Medicare HMO | Attending: Internal Medicine

## 2022-07-01 ENCOUNTER — Ambulatory Visit: Payer: Medicare Other | Admitting: Cardiovascular Disease

## 2022-08-19 DIAGNOSIS — J302 Other seasonal allergic rhinitis: Secondary | ICD-10-CM | POA: Diagnosis not present

## 2022-08-19 DIAGNOSIS — F419 Anxiety disorder, unspecified: Secondary | ICD-10-CM | POA: Diagnosis not present

## 2022-08-19 DIAGNOSIS — I2510 Atherosclerotic heart disease of native coronary artery without angina pectoris: Secondary | ICD-10-CM | POA: Diagnosis not present

## 2022-08-19 DIAGNOSIS — G4700 Insomnia, unspecified: Secondary | ICD-10-CM | POA: Diagnosis not present

## 2022-08-19 DIAGNOSIS — I10 Essential (primary) hypertension: Secondary | ICD-10-CM | POA: Diagnosis not present

## 2022-08-19 DIAGNOSIS — R160 Hepatomegaly, not elsewhere classified: Secondary | ICD-10-CM | POA: Diagnosis not present

## 2022-08-19 DIAGNOSIS — M12811 Other specific arthropathies, not elsewhere classified, right shoulder: Secondary | ICD-10-CM | POA: Diagnosis not present

## 2022-08-19 DIAGNOSIS — J449 Chronic obstructive pulmonary disease, unspecified: Secondary | ICD-10-CM | POA: Diagnosis not present

## 2022-08-19 DIAGNOSIS — K7030 Alcoholic cirrhosis of liver without ascites: Secondary | ICD-10-CM | POA: Diagnosis not present

## 2022-08-19 DIAGNOSIS — E782 Mixed hyperlipidemia: Secondary | ICD-10-CM | POA: Diagnosis not present

## 2022-08-19 DIAGNOSIS — R1013 Epigastric pain: Secondary | ICD-10-CM | POA: Diagnosis not present

## 2022-08-25 DIAGNOSIS — E875 Hyperkalemia: Secondary | ICD-10-CM | POA: Diagnosis not present

## 2022-11-18 DIAGNOSIS — I10 Essential (primary) hypertension: Secondary | ICD-10-CM | POA: Diagnosis not present

## 2022-11-25 DIAGNOSIS — M12811 Other specific arthropathies, not elsewhere classified, right shoulder: Secondary | ICD-10-CM | POA: Diagnosis not present

## 2022-11-25 DIAGNOSIS — G4700 Insomnia, unspecified: Secondary | ICD-10-CM | POA: Diagnosis not present

## 2022-11-25 DIAGNOSIS — I10 Essential (primary) hypertension: Secondary | ICD-10-CM | POA: Diagnosis not present

## 2022-11-25 DIAGNOSIS — J302 Other seasonal allergic rhinitis: Secondary | ICD-10-CM | POA: Diagnosis not present

## 2022-11-25 DIAGNOSIS — R160 Hepatomegaly, not elsewhere classified: Secondary | ICD-10-CM | POA: Diagnosis not present

## 2022-11-25 DIAGNOSIS — E782 Mixed hyperlipidemia: Secondary | ICD-10-CM | POA: Diagnosis not present

## 2022-11-25 DIAGNOSIS — J449 Chronic obstructive pulmonary disease, unspecified: Secondary | ICD-10-CM | POA: Diagnosis not present

## 2022-11-25 DIAGNOSIS — F419 Anxiety disorder, unspecified: Secondary | ICD-10-CM | POA: Diagnosis not present

## 2022-11-25 DIAGNOSIS — R1013 Epigastric pain: Secondary | ICD-10-CM | POA: Diagnosis not present

## 2022-11-25 DIAGNOSIS — K7030 Alcoholic cirrhosis of liver without ascites: Secondary | ICD-10-CM | POA: Diagnosis not present

## 2022-11-25 DIAGNOSIS — I2510 Atherosclerotic heart disease of native coronary artery without angina pectoris: Secondary | ICD-10-CM | POA: Diagnosis not present

## 2022-12-22 ENCOUNTER — Ambulatory Visit: Payer: Medicare HMO | Admitting: Internal Medicine

## 2022-12-28 ENCOUNTER — Ambulatory Visit: Payer: Medicare HMO | Attending: Internal Medicine | Admitting: Internal Medicine

## 2022-12-28 ENCOUNTER — Encounter: Payer: Self-pay | Admitting: Internal Medicine

## 2022-12-28 VITALS — BP 128/84 | HR 74 | Ht 59.0 in | Wt 114.8 lb

## 2022-12-28 DIAGNOSIS — E785 Hyperlipidemia, unspecified: Secondary | ICD-10-CM | POA: Diagnosis not present

## 2022-12-28 DIAGNOSIS — R0789 Other chest pain: Secondary | ICD-10-CM | POA: Diagnosis not present

## 2022-12-28 NOTE — Patient Instructions (Signed)
Medication Instructions:  The current medical regimen is effective;  continue present plan and medications.  *If you need a refill on your cardiac medications before your next appointment, please call your pharmacy*   Lab Work: LIPID  If you have labs (blood work) drawn today and your tests are completely normal, you will receive your results only by: MyChart Message (if you have MyChart) OR A paper copy in the mail If you have any lab test that is abnormal or we need to change your treatment, we will call you to review the results.   Follow-Up: At [facility], you and your health needs are our priority.  As part of our continuing mission to provide you with exceptional heart care, we have created designated Provider Care Teams.  These Care Teams include your primary Cardiologist (physician) and Advanced Practice Providers (APPs -  Physician Assistants and Nurse Practitioners) who all work together to provide you with the care you need, when you need it.  We recommend signing up for the patient portal called "MyChart".  Sign up information is provided on this After Visit Summary.  MyChart is used to connect with patients for Virtual Visits (Telemedicine).  Patients are able to view lab/test results, encounter notes, upcoming appointments, etc.  Non-urgent messages can be sent to your provider as well.   To learn more about what you can do with MyChart, go to [email or web address].    Your next appointment:   12 month(s)  Provider:   Carolan Clines, MD

## 2022-12-28 NOTE — Progress Notes (Signed)
Cardiology Office Note:    Date:  12/28/2022   ID:  Courtney Thornton, DOB [birth year], MRN [id number]  PCP:  Smith Robert, MD (Inactive)   [facility] Providers Cardiologist:  Dina Rich, MD     Referring MD: No ref. provider found   No chief complaint on file. Chest pain  History of Present Illness:    Courtney Thornton is a 57 y.o. female with a hx of anxiety, depression, COPD, etoh abuse, cirrhosis, hx of non cardiac CP, went to the ED for chest pain. EKG was normal. Troponin is negative. She notes she gets sharp pain from her chest to her back. It was worse on that day. It's the same discomfort from 2021. She notes the sharp pain comes and goes. Can happen while sitting, she has no activity limiting CP/SOB. Her physical activity is activities of daily living.  Seen in 2021 by Dr. Dominga Ferry and Rennis Harding NP. She reported CP at that time and DOE. She had symptoms of significant GERD. She had a lexiscan that was normal. She had a normal ehco in 2017 with mild MR. PA pressure 30 mmHg. She did have a LHC in 2011 that showed mild 40% ostial Lcx lesion.   She is a smoker since the age 34. He denies syncope or palpitations. He denies PND/orthopnea/LE edema. Notes some feet numbness,No claudication.   Interim hx 12/28/2022 Seen 06/2022, recommended exercise SPECT at that time. She did not follow up. Eluded to housing changes. She says CP is off and on. It is not daily. It can be sporadic. Not necessarily related to activity. Notes can be sharp  Past Medical History:  Diagnosis Date   Anxiety and depression    Cervical cancer (HCC)    Collagen vascular disease (HCC)    COPD (chronic obstructive pulmonary disease) (HCC)    Essential hypertension    GERD (gastroesophageal reflux disease)    Headache(784.0)    Hepatic cirrhosis (HCC)    History of alcohol abuse    History of blood transfusion    History of cardiac catheterization    Mild, nonobstructive circumflex  disease 2011 - [facility]   History of kidney stones    History of TIA (transient ischemic attack)    Per patient report   Hyperlipidemia    Insomnia    Liver lesion    MRI 2018   Migraines    Multiple sclerosis (HCC)     Past Surgical History:  Procedure Laterality Date   ABDOMINAL HYSTERECTOMY  2004   partial d/t fibroid tumors   ANTERIOR CERVICAL DECOMP/DISCECTOMY FUSION  11/24/2011   Procedure: ANTERIOR CERVICAL DECOMPRESSION/DISCECTOMY FUSION 3 LEVELS;  Surgeon: Hewitt Shorts, MD;  Location: [facility];  Service: Neurosurgery;  Laterality: N/A;  Cervical three-four, four-five,five-six anterior cervical decompression with fusion plating and bonegraft   BREAST SURGERY  2002   breast reduction   CERVICAL CONE BIOPSY  20+yrs ago   d/t cervical cancer   COLONOSCOPY WITH PROPOFOL N/A 04/27/2017   Dr. Jena Gauss" congest mucosa likely secondary to cirrhosis. internal hemorrhoids. otherwise normal.    ECTOPIC PREGNANCY SURGERY  1987/1989   ESOPHAGOGASTRODUODENOSCOPY (EGD) WITH PROPOFOL N/A 04/27/2017   Dr. Jena Gauss: normal esophagus s/p dilation. small hiatal hernia, portal gastropathy, normal duodenum. No specimens collected. Surveillance in 2 years    HAND SURGERY Right 90's   cyst removed from top of hand   MALONEY DILATION N/A 04/27/2017   Procedure: Elease Hashimoto DILATION;  Surgeon: Corbin Ade, MD;  Location: [facility];  Service: Endoscopy;  Laterality: N/A;   TUBAL LIGATION      Current Medications: Current Outpatient Medications on File Prior to Visit  Medication Sig Dispense Refill   acetaminophen (TYLENOL) 500 MG tablet Take 1,000 mg by mouth every 6 (six) hours as needed for moderate pain.     albuterol (PROVENTIL HFA;VENTOLIN HFA) 108 (90 Base) MCG/ACT inhaler Inhale 2 puffs into the lungs every 6 (six) hours as needed. For shortness of breath (Patient taking differently: Inhale 2 puffs into the lungs every 6 (six) hours as needed for wheezing or shortness of breath. For  shortness of breath) 8 g 0   amitriptyline (ELAVIL) 100 MG tablet Take 100 mg by mouth at bedtime as needed.     aspirin 81 MG chewable tablet Chew 81 mg by mouth daily.      Azelastine HCl 137 MCG/SPRAY SOLN Place 1 spray into both nostrils in the morning.     citalopram (CELEXA) 20 MG tablet Take 20 mg by mouth daily.     cyclobenzaprine (FLEXERIL) 10 MG tablet Take 10 mg by mouth 3 (three) times daily as needed.     diphenhydrAMINE (BENADRYL) 25 mg capsule Take 25 mg by mouth every 6 (six) hours as needed for itching.     DULERA 200-5 MCG/ACT AERO Inhale 2 puffs into the lungs.     FLONASE ALLERGY RELIEF 50 MCG/ACT nasal spray Place 2 sprays into both nostrils daily.     gabapentin (NEURONTIN) 300 MG capsule Take 300 mg by mouth 3 (three) times daily.      ibuprofen (ADVIL) 200 MG tablet Take 600 mg by mouth every 6 (six) hours as needed for moderate pain.     naproxen sodium (ANAPROX) 220 MG tablet Take 440 mg by mouth 2 (two) times daily as needed (pain).     nitroGLYCERIN (NITROSTAT) 0.4 MG SL tablet Place 1 tablet (0.4 mg total) under the tongue every 5 (five) minutes as needed. For chest pain 30 tablet 0   Omega-3 1000 MG CAPS Take 1,000 mg by mouth daily.      oxymetazoline (AFRIN) 0.05 % nasal spray Place 1 spray into both nostrils 2 (two) times daily as needed for congestion (for nose bleeds).     pantoprazole (PROTONIX) 40 MG tablet Take 40 mg by mouth at bedtime.     rosuvastatin (CRESTOR) 20 MG tablet Take 20 mg by mouth at bedtime.     spironolactone (ALDACTONE) 50 MG tablet Take 50 mg by mouth 2 (two) times daily.      traMADol (ULTRAM) 50 MG tablet Take 1 tablet (50 mg total) every 6 (six) hours as needed by mouth. 15 tablet 0   traZODone (DESYREL) 100 MG tablet Take 100 mg by mouth at bedtime.     No current facility-administered medications on file prior to visit.    Allergies:   Codeine   Social History   Socioeconomic History   Marital status: Married    Spouse  name: Not on file   Number of children: Not on file   Years of education: Not on file   Highest education level: Not on file  Occupational History   Occupation: disability  Tobacco Use   Smoking status: Every Day    Packs/day: 1.00    Years: 32.00    Additional pack years: 0.00    Total pack years: 32.00    Types: Cigarettes   Smokeless tobacco: Never  Vaping Use   Vaping  Use: Never used  Substance and Sexual Activity   Alcohol use: Not Currently    Alcohol/week: 1.0 standard drink of alcohol    Types: 1 Cans of beer per week    Comment: History of intermittent alcohol use   Drug use: Yes    Types: Marijuana    Comment: pt states she was told to use this for weight gain    Sexual activity: Yes    Birth control/protection: Surgical  Other Topics Concern   Not on file  Social History Narrative   Not on file   Social Determinants of Health   Financial Resource Strain: Not on file  Food Insecurity: Not on file  Transportation Needs: Not on file  Physical Activity: Not on file  Stress: Not on file  Social Connections: Not on file     Family History: The patient's family history includes Alcoholism in her mother; Anemia in her daughter; Aneurysm in her sister; Arthritis in her father; Cancer in her father; Hypertension in her mother. There is no history of Anesthesia problems, Hypotension, Malignant hyperthermia, Pseudochol deficiency, Colon cancer, or Colon polyps.  ROS:   Please see the history of present illness.     All other systems reviewed and are negative.  EKGs/Labs/Other Studies Reviewed:    The following studies were reviewed today:  EKG:  EKG is  ordered today.  The ekg ordered today demonstrates   06/17/2022- NSR  Recent Labs: 05/18/2022: BUN 15; Creatinine, Ser 0.78; Hemoglobin 11.6; Platelets 295; Potassium 3.3; Sodium 139   Recent Lipid Panel    Component Value Date/Time   CHOL  12/25/2009 0526    175        ATP III CLASSIFICATION:  <200      mg/dL   Desirable  161-096  mg/dL   Borderline High  >=045    mg/dL   High          TRIG 409 (H) 12/25/2009 0526   HDL 64 12/25/2009 0526   CHOLHDL 2.7 12/25/2009 0526   VLDL 35 12/25/2009 0526   LDLCALC  12/25/2009 0526    76        Total Cholesterol/HDL:CHD Risk Coronary Heart Disease Risk Table                     Men   Women  1/2 Average Risk   3.4   3.3  Average Risk       5.0   4.4  2 X Average Risk   9.6   7.1  3 X Average Risk  23.4   11.0        Use the calculated Patient Ratio above and the CHD Risk Table to determine the patient's CHD Risk.        ATP III CLASSIFICATION (LDL):  <100     mg/dL   Optimal  811-914  mg/dL   Near or Above                    Optimal  130-159  mg/dL   Borderline  782-956  mg/dL   High  >213     mg/dL   Very High     Risk Assessment/Calculations:     Physical Exam:    VS:   Vitals:   12/28/22 1051  BP: 128/84  Pulse: 74  SpO2: 99%     Wt Readings from Last 3 Encounters:  06/17/22 117 lb 6.4 oz (53.3 kg)  05/18/22 118 lb (53.5  kg)  07/20/21 119 lb 12.8 oz (54.3 kg)     GEN:  Well nourished, well developed in no acute distress HEENT: Normal NECK: No JVD; No carotid bruits LYMPHATICS: No lymphadenopathy CARDIAC: RRR, no murmurs, rubs, gallops RESPIRATORY:  Clear to auscultation without rales, wheezing or rhonchi  ABDOMEN: Soft, non-tender, non-distended MUSCULOSKELETAL:  No edema; No deformity  SKIN: Warm and dry NEUROLOGIC:  Alert and oriented x 3 PSYCHIATRIC:  Normal affect   ASSESSMENT:    Chest pain: atypical features however she was noted to have Lcx disease in 2011 and continues to smoke.   - continue ASA 81 mg daily -on crestor 20 mg daily, FU lipids  HTN: on spironolactone  50 mg BID  MSK CP:She has chronic CP and likely predominantly MSK. Discussed can try tylenol/naproxen  Tobacco Cessation: discussed the importance of smoking cessation. Patch was not comfortable, stopped. Slowing down.   PLAN:     In order of problems listed above:  Fasting lipids Follow up in 1 year       Shared Decision Making/Informed Consent The risks [chest pain, shortness of breath, cardiac arrhythmias, dizziness, blood pressure fluctuations, myocardial infarction, stroke/transient ischemic attack, and life-threatening complications (estimated to be 1 in 10,000)], benefits (risk stratification, diagnosing coronary artery disease, treatment guidance) and alternatives of an exercise tolerance test were discussed in detail with Courtney Thornton and she agrees to proceed.    Medication Adjustments/Labs and Tests Ordered: Current medicines are reviewed at length with the patient today.  Concerns regarding medicines are outlined above.  No orders of the defined types were placed in this encounter.  No orders of the defined types were placed in this encounter.   There are no Patient Instructions on file for this visit.   Signed, Maisie Fus, MD  12/28/2022 9:19 AM    [facility]

## 2022-12-29 LAB — LIPID PANEL
Chol/HDL Ratio: 1.7 ratio (ref 0.0–4.4)
Cholesterol, Total: 219 mg/dL — ABNORMAL HIGH (ref 100–199)
HDL: 131 mg/dL (ref >39)
LDL Chol Calc (NIH): 69 mg/dL (ref 0–99)
Triglycerides: 118 mg/dL (ref 0–149)
VLDL Cholesterol Cal: 19 mg/dL (ref 5–40)

## 2023-05-25 DIAGNOSIS — R110 Nausea: Secondary | ICD-10-CM | POA: Diagnosis not present

## 2023-05-25 DIAGNOSIS — A084 Viral intestinal infection, unspecified: Secondary | ICD-10-CM | POA: Diagnosis not present

## 2023-05-25 DIAGNOSIS — R6889 Other general symptoms and signs: Secondary | ICD-10-CM | POA: Diagnosis not present

## 2023-05-30 DIAGNOSIS — Z7182 Exercise counseling: Secondary | ICD-10-CM | POA: Diagnosis not present

## 2023-05-30 DIAGNOSIS — Z713 Dietary counseling and surveillance: Secondary | ICD-10-CM | POA: Diagnosis not present

## 2023-05-30 DIAGNOSIS — M25531 Pain in right wrist: Secondary | ICD-10-CM | POA: Diagnosis not present

## 2023-05-31 DIAGNOSIS — Z7182 Exercise counseling: Secondary | ICD-10-CM | POA: Diagnosis not present

## 2023-05-31 DIAGNOSIS — K7030 Alcoholic cirrhosis of liver without ascites: Secondary | ICD-10-CM | POA: Diagnosis not present

## 2023-05-31 DIAGNOSIS — Z139 Encounter for screening, unspecified: Secondary | ICD-10-CM | POA: Diagnosis not present

## 2023-05-31 DIAGNOSIS — I10 Essential (primary) hypertension: Secondary | ICD-10-CM | POA: Diagnosis not present

## 2023-05-31 DIAGNOSIS — J449 Chronic obstructive pulmonary disease, unspecified: Secondary | ICD-10-CM | POA: Diagnosis not present

## 2023-05-31 DIAGNOSIS — J302 Other seasonal allergic rhinitis: Secondary | ICD-10-CM | POA: Diagnosis not present

## 2023-05-31 DIAGNOSIS — Z7689 Persons encountering health services in other specified circumstances: Secondary | ICD-10-CM | POA: Diagnosis not present

## 2023-05-31 DIAGNOSIS — Z713 Dietary counseling and surveillance: Secondary | ICD-10-CM | POA: Diagnosis not present

## 2023-09-01 DIAGNOSIS — R059 Cough, unspecified: Secondary | ICD-10-CM | POA: Diagnosis not present

## 2023-09-01 DIAGNOSIS — J069 Acute upper respiratory infection, unspecified: Secondary | ICD-10-CM | POA: Diagnosis not present

## 2023-09-01 DIAGNOSIS — F17200 Nicotine dependence, unspecified, uncomplicated: Secondary | ICD-10-CM | POA: Diagnosis not present

## 2023-09-05 ENCOUNTER — Encounter (INDEPENDENT_AMBULATORY_CARE_PROVIDER_SITE_OTHER): Payer: Self-pay | Admitting: *Deleted

## 2023-09-20 ENCOUNTER — Other Ambulatory Visit (HOSPITAL_COMMUNITY): Payer: Self-pay | Admitting: Nurse Practitioner

## 2023-09-20 DIAGNOSIS — Z1231 Encounter for screening mammogram for malignant neoplasm of breast: Secondary | ICD-10-CM

## 2023-10-24 ENCOUNTER — Ambulatory Visit (HOSPITAL_COMMUNITY)
Admission: RE | Admit: 2023-10-24 | Discharge: 2023-10-24 | Disposition: A | Discharge status: Home / Self Care | Payer: Medicare HMO | Source: Ambulatory Visit | Attending: Nurse Practitioner | Admitting: Nurse Practitioner

## 2023-10-24 DIAGNOSIS — Z1231 Encounter for screening mammogram for malignant neoplasm of breast: Secondary | ICD-10-CM | POA: Diagnosis not present

## 2024-02-14 ENCOUNTER — Emergency Department (HOSPITAL_COMMUNITY)

## 2024-02-14 ENCOUNTER — Encounter (HOSPITAL_COMMUNITY): Payer: Self-pay

## 2024-02-14 ENCOUNTER — Emergency Department (HOSPITAL_COMMUNITY)
Admission: EM | Admit: 2024-02-14 | Discharge: 2024-02-14 | Disposition: A | Discharge status: Home / Self Care | Attending: Emergency Medicine | Admitting: Emergency Medicine

## 2024-02-14 ENCOUNTER — Other Ambulatory Visit: Payer: Self-pay

## 2024-02-14 DIAGNOSIS — X58XXXA Exposure to other specified factors, initial encounter: Secondary | ICD-10-CM | POA: Insufficient documentation

## 2024-02-14 DIAGNOSIS — S90122A Contusion of left lesser toe(s) without damage to nail, initial encounter: Secondary | ICD-10-CM | POA: Insufficient documentation

## 2024-02-14 DIAGNOSIS — S99922A Unspecified injury of left foot, initial encounter: Secondary | ICD-10-CM | POA: Diagnosis not present

## 2024-02-14 DIAGNOSIS — Z7982 Long term (current) use of aspirin: Secondary | ICD-10-CM | POA: Diagnosis not present

## 2024-02-14 MED ORDER — IBUPROFEN 800 MG PO TABS
800.0000 mg | ORAL_TABLET | Freq: Three times a day (TID) | ORAL | 0 refills | Status: AC
Start: 2024-02-14 — End: ?

## 2024-02-14 NOTE — ED Notes (Signed)
 See triage notes.mild swelling noted to these toes. States she stumped in on a chair early this am. Tender to touch.

## 2024-02-14 NOTE — ED Triage Notes (Signed)
 Pt arrived vvia pOV from home c/o injury to left 3rd, 4th and 5th phalanges. Pt reports pain with ambulation and reports she is unable to bend them at this time.

## 2024-02-14 NOTE — ED Provider Notes (Signed)
 Courtney Thornton Provider Note   CSN: [id number] Arrival date & time: 02/14/24  1150     History  Chief Complaint  Patient presents with   Toe Injury    Courtney Thornton is a 58 y.o. female.  HPI      Courtney Thornton is a 58 y.o. female who presents to the Emergency Department complaining of pain to her 3rd, 4th and 5th toes of her left foot.  She describes a direct blow to her foot.  Incident occurred shortly before ER arrival.  Denies fall, pain associated with standing or weightbearing.  States she is having difficulty bending her toes.  Denies any numbness of her foot or pain of her ankle. No open wounds.  Denies any injury of the nails    Home Medications Prior to Admission medications   Medication Sig Start Date End Date Taking? Authorizing Provider  acetaminophen  (TYLENOL ) 500 MG tablet Take 1,000 mg by mouth every 6 (six) hours as needed for moderate pain.    [provider]  albuterol  (PROVENTIL  HFA;VENTOLIN  HFA) 108 (90 Base) MCG/ACT inhaler Inhale 2 puffs into the lungs every 6 (six) hours as needed. For shortness of breath Patient taking differently: Inhale 2 puffs into the lungs every 6 (six) hours as needed for wheezing or shortness of breath. For shortness of breath 09/01/16   Eubanks, Jessica K, NP  amitriptyline (ELAVIL) 100 MG tablet Take 100 mg by mouth at bedtime as needed.    [provider]  aspirin 81 MG chewable tablet Chew 81 mg by mouth daily.     [provider]  Azelastine HCl 137 MCG/SPRAY SOLN Place 1 spray into both nostrils in the morning. 01/20/22   [provider]  citalopram (CELEXA) 20 MG tablet Take 20 mg by mouth daily. 01/20/22   [provider]  cyclobenzaprine  (FLEXERIL ) 10 MG tablet Take 10 mg by mouth 3 (three) times daily as needed.    [provider]  diphenhydrAMINE (BENADRYL) 25 mg capsule Take 25 mg by mouth every 6 (six) hours as  needed for itching.    [provider]  DULERA 200-5 MCG/ACT AERO Inhale 2 puffs into the lungs. 11/25/22   [provider]  FLONASE ALLERGY RELIEF 50 MCG/ACT nasal spray Place 2 sprays into both nostrils daily.    [provider]  gabapentin (NEURONTIN) 300 MG capsule Take 300 mg by mouth 3 (three) times daily.     [provider]  ibuprofen  (ADVIL ) 200 MG tablet Take 600 mg by mouth every 6 (six) hours as needed for moderate pain.    [provider]  naproxen  sodium (ANAPROX ) 220 MG tablet Take 440 mg by mouth 2 (two) times daily as needed (pain).    [provider]  nitroGLYCERIN  (NITROSTAT ) 0.4 MG SL tablet Place 1 tablet (0.4 mg total) under the tongue every 5 (five) minutes as needed. For chest pain 09/01/16   Verma Gobble, NP  Omega-3 1000 MG CAPS Take 1,000 mg by mouth daily.     [provider]  oxymetazoline  (AFRIN) 0.05 % nasal spray Place 1 spray into both nostrils 2 (two) times daily as needed for congestion (for nose bleeds).    [provider]  pantoprazole  (PROTONIX ) 40 MG tablet Take 40 mg by mouth at bedtime.    [provider]  rosuvastatin (CRESTOR) 20 MG tablet Take 20 mg by mouth at bedtime. 06/19/20   [provider]  spironolactone  (ALDACTONE ) 50 MG tablet Take 50 mg by mouth 2 (two) times daily.  01/31/17   [provider]  traMADol  (ULTRAM ) 50 MG tablet Take 1 tablet (50 mg total) every 6 (six) hours as needed by mouth. 07/17/17   Venson Ginger, PA-C  traZODone (DESYREL) 100 MG tablet Take 100 mg by mouth at bedtime.    [provider]      Allergies    Codeine    Review of Systems   Review of Systems  Constitutional:  Negative for chills and fever.  Respiratory:  Negative for cough and shortness of breath.   Musculoskeletal:  Positive for arthralgias (pain left foot).  Skin:  Negative for rash and wound.  Neurological:  Negative for dizziness, weakness,  numbness and headaches.    Physical Exam Updated Vital Signs BP (!) 148/80 (BP Location: Right Arm)   Pulse 93   Temp 98.2 F (36.8 C) (Oral)   Resp 16   Ht 4\' 11"  (1.499 m)   Wt 55.3 kg   SpO2 100%   BMI 24.64 kg/m  Physical Exam Vitals and nursing note reviewed.  Constitutional:      General: She is not in acute distress.    Appearance: Normal appearance. She is not ill-appearing or toxic-appearing.  Cardiovascular:     Rate and Rhythm: Normal rate and regular rhythm.     Pulses: Normal pulses.  Pulmonary:     Effort: Pulmonary effort is normal.  Abdominal:     Palpations: Abdomen is soft.     Tenderness: There is no abdominal tenderness.  Musculoskeletal:        General: Tenderness and signs of injury present. No swelling.     Right lower leg: No edema.     Left lower leg: No edema.     Comments: Mild pain with ROM of the 3rd, 4th and fifth toes.  No bony deformity or edema.  No open wounds.  Left ankle and dorsal foot non tender.    Skin:    General: Skin is warm.     Capillary Refill: Capillary refill takes less than 2 seconds.  Neurological:     General: No focal deficit present.     Mental Status: She is alert.     Sensory: No sensory deficit.     Motor: No weakness.     ED Results / Procedures / Treatments   Labs (all labs ordered are listed, but only abnormal results are displayed) Labs Reviewed - No data to display  EKG None  Radiology DG Foot Complete Left Result Date: 02/14/2024 CLINICAL DATA:  Injury left 3rd, 4th, and 5th toes. EXAM: LEFT FOOT - COMPLETE 3+ VIEW COMPARISON:  None Available. FINDINGS: There is no evidence of fracture or dislocation. There is no evidence of arthropathy or other focal bone abnormality. Soft tissues are unremarkable. IMPRESSION: Negative. Electronically Signed   By: Janeece Mechanic M.D.   On: 02/14/2024 14:18    Procedures Procedures    Medications Ordered in ED Medications - No data to display  ED Course/ Medical  Decision Making/ A&P                                 Medical Decision Making Patient here for evaluation of pain to her left 3rd, 4th and 5th toes.  Describes a direct blow shortly before ER arrival.  Having pain to her foot with movement and weightbearing.  No open wound.   I suspect contusion, fracture dislocation also considered.  No nail involvement.  Extremity neurovascularly intact.  Amount and/or Complexity of Data Reviewed Radiology: ordered.    Details: X-ray of the foot negative Discussion of management or test interpretation with external provider(s): Likely contusion of the toes of the left foot.  Neurovascularly intact.  No open wound.  Postop shoe given, she is agreeable to symptomatic treatment.  Will give orthopedic follow-up if needed.           Final Clinical Impression(s) / ED Diagnoses Final diagnoses:  Contusion of lesser toe of left foot without damage to nail, initial encounter    Rx / DC Orders ED Discharge Orders     None         Catherne Clubs, PA-C 02/14/24 1439    Iva Mariner, MD 02/14/24 [phone]

## 2024-02-14 NOTE — Discharge Instructions (Signed)
 You may wear the postop shoe as needed for support when standing or walking.  You may remove at rest bedtime and bathing.  Take the prescription ibuprofen  as directed with food.  Elevate your foot when possible.  I have listed a local podiatrist that you may contact to arrange follow-up appointment if needed.

## 2024-02-28 ENCOUNTER — Encounter: Payer: Self-pay | Admitting: Internal Medicine

## 2024-03-21 DIAGNOSIS — Z9289 Personal history of other medical treatment: Secondary | ICD-10-CM | POA: Diagnosis not present

## 2024-09-03 DIAGNOSIS — G8929 Other chronic pain: Secondary | ICD-10-CM | POA: Diagnosis not present

## 2024-10-09 NOTE — Progress Notes (Signed)
 Courtney Thornton                                          MRN: [id number]   10/09/2024   The [facility] Quality Team Specialist reviewed this patient medical record for the purposes of chart review for care gap closure. The following were reviewed: chart review for care gap closure-controlling blood pressure.    [facility] Quality Team
